# Patient Record
Sex: Female | Born: 1945 | Race: Black or African American | Hispanic: No | Smoking: Former smoker
Health system: Southern US, Community
[De-identification: ages and names within clinical notes are randomized; demographics above are authoritative.]

## PROBLEM LIST (undated history)

## (undated) DIAGNOSIS — J939 Pneumothorax, unspecified: Secondary | ICD-10-CM

## (undated) DIAGNOSIS — F319 Bipolar disorder, unspecified: Secondary | ICD-10-CM

## (undated) DIAGNOSIS — F0390 Unspecified dementia without behavioral disturbance: Secondary | ICD-10-CM

## (undated) DIAGNOSIS — I10 Essential (primary) hypertension: Secondary | ICD-10-CM

## (undated) DIAGNOSIS — F209 Schizophrenia, unspecified: Secondary | ICD-10-CM

## (undated) DIAGNOSIS — E278 Other specified disorders of adrenal gland: Secondary | ICD-10-CM

## (undated) DIAGNOSIS — C801 Malignant (primary) neoplasm, unspecified: Secondary | ICD-10-CM

## (undated) HISTORY — PX: OTHER SURGICAL HISTORY: SHX169

---

## 2000-10-28 ENCOUNTER — Ambulatory Visit (HOSPITAL_COMMUNITY): Admission: RE | Admit: 2000-10-28 | Discharge: 2000-10-28 | Payer: Self-pay | Admitting: Family Medicine

## 2000-10-28 ENCOUNTER — Encounter: Payer: Self-pay | Admitting: Family Medicine

## 2000-11-19 ENCOUNTER — Other Ambulatory Visit: Admission: RE | Admit: 2000-11-19 | Discharge: 2000-11-19 | Payer: Self-pay | Admitting: Family Medicine

## 2000-11-20 ENCOUNTER — Ambulatory Visit (HOSPITAL_COMMUNITY): Admission: RE | Admit: 2000-11-20 | Discharge: 2000-11-20 | Payer: Self-pay | Admitting: Family Medicine

## 2000-11-20 ENCOUNTER — Encounter: Payer: Self-pay | Admitting: Family Medicine

## 2000-12-03 ENCOUNTER — Ambulatory Visit (HOSPITAL_COMMUNITY): Admission: RE | Admit: 2000-12-03 | Discharge: 2000-12-03 | Payer: Self-pay | Admitting: General Surgery

## 2001-01-12 ENCOUNTER — Emergency Department (HOSPITAL_COMMUNITY): Admission: EM | Admit: 2001-01-12 | Discharge: 2001-01-12 | Payer: Self-pay | Admitting: Emergency Medicine

## 2003-08-29 ENCOUNTER — Ambulatory Visit (HOSPITAL_COMMUNITY): Admission: RE | Admit: 2003-08-29 | Discharge: 2003-08-29 | Payer: Self-pay | Admitting: Family Medicine

## 2003-11-29 ENCOUNTER — Emergency Department (HOSPITAL_COMMUNITY): Admission: EM | Admit: 2003-11-29 | Discharge: 2003-11-30 | Payer: Self-pay

## 2004-01-19 ENCOUNTER — Ambulatory Visit: Payer: Self-pay | Admitting: Family Medicine

## 2004-02-09 ENCOUNTER — Ambulatory Visit (HOSPITAL_COMMUNITY): Admission: RE | Admit: 2004-02-09 | Discharge: 2004-02-09 | Payer: Self-pay | Admitting: Family Medicine

## 2004-03-06 ENCOUNTER — Ambulatory Visit: Payer: Self-pay | Admitting: Family Medicine

## 2004-04-05 ENCOUNTER — Ambulatory Visit: Payer: Self-pay | Admitting: Family Medicine

## 2004-09-25 ENCOUNTER — Ambulatory Visit (HOSPITAL_COMMUNITY): Admission: RE | Admit: 2004-09-25 | Discharge: 2004-09-25 | Payer: Self-pay | Admitting: Family Medicine

## 2005-09-25 ENCOUNTER — Ambulatory Visit: Payer: Self-pay | Admitting: Family Medicine

## 2008-02-08 ENCOUNTER — Emergency Department (HOSPITAL_COMMUNITY): Admission: EM | Admit: 2008-02-08 | Discharge: 2008-02-08 | Payer: Self-pay | Admitting: Emergency Medicine

## 2008-03-16 ENCOUNTER — Emergency Department (HOSPITAL_COMMUNITY): Admission: EM | Admit: 2008-03-16 | Discharge: 2008-03-16 | Payer: Self-pay | Admitting: Emergency Medicine

## 2008-07-10 ENCOUNTER — Emergency Department (HOSPITAL_COMMUNITY): Admission: EM | Admit: 2008-07-10 | Discharge: 2008-07-10 | Payer: Self-pay | Admitting: Emergency Medicine

## 2008-07-19 ENCOUNTER — Emergency Department (HOSPITAL_COMMUNITY): Admission: EM | Admit: 2008-07-19 | Discharge: 2008-07-19 | Payer: Self-pay | Admitting: Emergency Medicine

## 2008-07-20 ENCOUNTER — Inpatient Hospital Stay (HOSPITAL_COMMUNITY): Admission: EM | Admit: 2008-07-20 | Discharge: 2008-07-25 | Payer: Self-pay | Admitting: Psychiatry

## 2008-07-20 ENCOUNTER — Ambulatory Visit: Payer: Self-pay | Admitting: Psychiatry

## 2008-11-25 ENCOUNTER — Ambulatory Visit: Payer: Self-pay | Admitting: Psychiatry

## 2008-11-25 ENCOUNTER — Inpatient Hospital Stay (HOSPITAL_COMMUNITY): Admission: AD | Admit: 2008-11-25 | Discharge: 2008-12-01 | Payer: Self-pay | Admitting: Psychiatry

## 2008-11-25 ENCOUNTER — Emergency Department (HOSPITAL_COMMUNITY): Admission: EM | Admit: 2008-11-25 | Discharge: 2008-11-25 | Payer: Self-pay | Admitting: Emergency Medicine

## 2008-12-30 ENCOUNTER — Emergency Department (HOSPITAL_COMMUNITY): Admission: EM | Admit: 2008-12-30 | Discharge: 2008-12-30 | Payer: Self-pay | Admitting: Emergency Medicine

## 2009-02-14 ENCOUNTER — Encounter: Payer: Self-pay | Admitting: Family Medicine

## 2009-02-27 ENCOUNTER — Ambulatory Visit (HOSPITAL_COMMUNITY): Admission: RE | Admit: 2009-02-27 | Discharge: 2009-02-27 | Payer: Self-pay | Admitting: Family Medicine

## 2009-02-28 ENCOUNTER — Ambulatory Visit (HOSPITAL_COMMUNITY): Admission: RE | Admit: 2009-02-28 | Discharge: 2009-02-28 | Payer: Self-pay | Admitting: Family Medicine

## 2009-07-21 ENCOUNTER — Emergency Department (HOSPITAL_COMMUNITY): Admission: EM | Admit: 2009-07-21 | Discharge: 2009-07-21 | Payer: Self-pay | Admitting: Emergency Medicine

## 2009-10-17 ENCOUNTER — Emergency Department (HOSPITAL_COMMUNITY): Admission: EM | Admit: 2009-10-17 | Discharge: 2009-10-17 | Payer: Self-pay | Admitting: Emergency Medicine

## 2009-10-25 ENCOUNTER — Emergency Department (HOSPITAL_COMMUNITY)
Admission: EM | Admit: 2009-10-25 | Discharge: 2009-10-25 | Disposition: A | Discharge status: Other Acute Inpatient Hospital | Payer: Self-pay | Source: Home / Self Care | Admitting: Emergency Medicine

## 2010-02-09 ENCOUNTER — Emergency Department (HOSPITAL_COMMUNITY)
Admission: EM | Admit: 2010-02-09 | Discharge: 2010-02-10 | Payer: Self-pay | Source: Home / Self Care | Admitting: Emergency Medicine

## 2010-02-10 ENCOUNTER — Inpatient Hospital Stay (HOSPITAL_COMMUNITY)
Admission: AD | Admit: 2010-02-10 | Discharge: 2010-02-14 | Payer: Self-pay | Attending: Psychiatry | Admitting: Psychiatry

## 2010-03-04 ENCOUNTER — Encounter: Payer: Self-pay | Admitting: Family Medicine

## 2010-03-15 NOTE — Letter (Signed)
Summary: Historic Patient File  Historic Patient File   Imported By: Lind Guest 02/14/2009 09:01:48  _____________________________________________________________________  External Attachment:    Type:   Image     Comment:   External Document

## 2010-04-18 ENCOUNTER — Emergency Department (HOSPITAL_COMMUNITY)
Admission: EM | Admit: 2010-04-18 | Discharge: 2010-04-18 | Disposition: A | Discharge status: Home / Self Care | Payer: Medicare Other | Attending: Emergency Medicine | Admitting: Emergency Medicine

## 2010-04-18 DIAGNOSIS — F29 Unspecified psychosis not due to a substance or known physiological condition: Secondary | ICD-10-CM | POA: Insufficient documentation

## 2010-04-18 DIAGNOSIS — Z8659 Personal history of other mental and behavioral disorders: Secondary | ICD-10-CM | POA: Insufficient documentation

## 2010-04-18 DIAGNOSIS — I10 Essential (primary) hypertension: Secondary | ICD-10-CM | POA: Insufficient documentation

## 2010-04-23 LAB — URINE CULTURE
Colony Count: NO GROWTH
Culture  Setup Time: 201112311954
Culture: NO GROWTH

## 2010-04-23 LAB — COMPREHENSIVE METABOLIC PANEL
CO2: 25 mEq/L (ref 19–32)
Calcium: 9.4 mg/dL (ref 8.4–10.5)
Chloride: 106 mEq/L (ref 96–112)
GFR calc Af Amer: >60 mL/min (ref >60)

## 2010-04-23 LAB — RAPID URINE DRUG SCREEN, HOSP PERFORMED
Amphetamines: NOT DETECTED
Barbiturates: NOT DETECTED
Benzodiazepines: NOT DETECTED
Cocaine: NOT DETECTED
Opiates: NOT DETECTED
Tetrahydrocannabinol: NOT DETECTED

## 2010-04-23 LAB — URINE MICROSCOPIC-ADD ON

## 2010-04-23 LAB — CBC
HCT: 43.8 % (ref 36.0–46.0)
Hemoglobin: 15.2 g/dL — ABNORMAL HIGH (ref 12.0–15.0)
MCH: 29.9 pg (ref 26.0–34.0)
MCH: 29.9 pg (ref 26.0–34.0)
MCHC: 34.7 g/dL (ref 30.0–36.0)
MCV: 86.2 fL (ref 78.0–100.0)
MCV: 88.9 fL (ref 78.0–100.0)
Platelets: 213 10*3/uL (ref 150–400)
Platelets: 207 10*3/uL (ref 150–400)
RBC: 5.08 MIL/uL (ref 3.87–5.11)
RDW: 13.5 % (ref 11.5–15.5)
WBC: 8.6 K/uL (ref 4.0–10.5)

## 2010-04-23 LAB — DRUGS OF ABUSE SCREEN W/O ALC, ROUTINE URINE
Barbiturate Quant, Ur: NEGATIVE
Benzodiazepines.: NEGATIVE
Cocaine Metabolites: NEGATIVE
Creatinine,U: 96.8 mg/dL
Marijuana Metabolite: NEGATIVE
Methadone: NEGATIVE
Opiate Screen, Urine: NEGATIVE
Phencyclidine (PCP): NEGATIVE

## 2010-04-23 LAB — URINALYSIS, ROUTINE W REFLEX MICROSCOPIC
Glucose, UA: NEGATIVE mg/dL
Glucose, UA: NEGATIVE mg/dL
Ketones, ur: 15 mg/dL — AB
Nitrite: NEGATIVE
Nitrite: NEGATIVE
Protein, ur: NEGATIVE mg/dL
Specific Gravity, Urine: >1.03 — ABNORMAL HIGH (ref 1.005–1.030)
Specific Gravity, Urine: 1.015 (ref 1.005–1.030)
Urobilinogen, UA: 0.2 mg/dL (ref 0.0–1.0)
pH: 5 (ref 5.0–8.0)
pH: 5.5 (ref 5.0–8.0)

## 2010-04-23 LAB — BASIC METABOLIC PANEL
BUN: 19 mg/dL (ref 6–23)
CO2: 22 mEq/L (ref 19–32)
Calcium: 9.3 mg/dL (ref 8.4–10.5)
Creatinine, Ser: 0.82 mg/dL (ref 0.4–1.2)
Potassium: 3.9 mEq/L (ref 3.5–5.1)
Sodium: 134 mEq/L — ABNORMAL LOW (ref 135–145)

## 2010-04-23 LAB — BASIC METABOLIC PANEL WITH GFR
Chloride: 99 meq/L (ref 96–112)
GFR calc Af Amer: >60 mL/min (ref >60)
GFR calc non Af Amer: >60 mL/min (ref >60)
Glucose, Bld: 131 mg/dL — ABNORMAL HIGH (ref 70–99)

## 2010-04-23 LAB — ETHANOL: Alcohol, Ethyl (B): <5 mg/dL (ref 0–10)

## 2010-04-23 LAB — DIFFERENTIAL
Basophils Absolute: 0.1 K/uL (ref 0.0–0.1)
Basophils Relative: 1 % (ref 0–1)
Eosinophils Absolute: 0.1 10*3/uL (ref 0.0–0.7)
Eosinophils Relative: 1 % (ref 0–5)
Lymphocytes Relative: 14 % (ref 12–46)
Lymphs Abs: 1.2 K/uL (ref 0.7–4.0)
Monocytes Absolute: 0.6 10*3/uL (ref 0.1–1.0)
Monocytes Relative: 7 % (ref 3–12)
Neutro Abs: 6.7 K/uL (ref 1.7–7.7)
Neutrophils Relative %: 78 % — ABNORMAL HIGH (ref 43–77)

## 2010-04-23 LAB — TSH: TSH: 1.232 u[IU]/mL (ref 0.350–4.500)

## 2010-04-26 LAB — DIFFERENTIAL
Basophils Absolute: 0 10*3/uL (ref 0.0–0.1)
Basophils Relative: 1 % (ref 0–1)
Eosinophils Absolute: 0 10*3/uL (ref 0.0–0.7)
Lymphocytes Relative: 16 % (ref 12–46)
Lymphocytes Relative: 25 % (ref 12–46)
Lymphs Abs: 1.1 10*3/uL (ref 0.7–4.0)
Monocytes Relative: 6 % (ref 3–12)
Neutro Abs: 4.2 10*3/uL (ref 1.7–7.7)
Neutrophils Relative %: 77 % (ref 43–77)

## 2010-04-26 LAB — BASIC METABOLIC PANEL
BUN: 12 mg/dL (ref 6–23)
BUN: 8 mg/dL (ref 6–23)
Calcium: 9.3 mg/dL (ref 8.4–10.5)
Chloride: 105 mEq/L (ref 96–112)
Creatinine, Ser: 0.63 mg/dL (ref 0.4–1.2)
GFR calc Af Amer: >60 mL/min (ref >60)
GFR calc Af Amer: >60 mL/min (ref >60)
GFR calc non Af Amer: >60 mL/min (ref >60)

## 2010-04-26 LAB — CBC
HCT: 45 % (ref 36.0–46.0)
MCH: 30 pg (ref 26.0–34.0)
MCV: 90.6 fL (ref 78.0–100.0)
Platelets: 226 10*3/uL (ref 150–400)
Platelets: 237 10*3/uL (ref 150–400)
RBC: 4.62 MIL/uL (ref 3.87–5.11)
RDW: 14.9 % (ref 11.5–15.5)
RDW: 15 % (ref 11.5–15.5)
WBC: 7 10*3/uL (ref 4.0–10.5)

## 2010-04-26 LAB — RAPID URINE DRUG SCREEN, HOSP PERFORMED
Amphetamines: NOT DETECTED
Amphetamines: NOT DETECTED
Benzodiazepines: NOT DETECTED
Cocaine: NOT DETECTED
Opiates: NOT DETECTED
Tetrahydrocannabinol: NOT DETECTED
Tetrahydrocannabinol: NOT DETECTED

## 2010-04-26 LAB — TRICYCLICS SCREEN, URINE: TCA Scrn: NOT DETECTED

## 2010-04-26 LAB — ETHANOL
Alcohol, Ethyl (B): <5 mg/dL (ref 0–10)
Alcohol, Ethyl (B): <5 mg/dL (ref 0–10)

## 2010-04-30 LAB — CBC
HCT: 38.7 % (ref 36.0–46.0)
Hemoglobin: 13.2 g/dL (ref 12.0–15.0)
RBC: 4.37 MIL/uL (ref 3.87–5.11)
WBC: 5.8 10*3/uL (ref 4.0–10.5)

## 2010-04-30 LAB — BASIC METABOLIC PANEL
Calcium: 9.4 mg/dL (ref 8.4–10.5)
GFR calc Af Amer: >60 mL/min (ref >60)
GFR calc non Af Amer: >60 mL/min (ref >60)
Glucose, Bld: 117 mg/dL — ABNORMAL HIGH (ref 70–99)
Potassium: 4.1 mEq/L (ref 3.5–5.1)
Sodium: 138 mEq/L (ref 135–145)

## 2010-04-30 LAB — DIFFERENTIAL
Basophils Absolute: 0 10*3/uL (ref 0.0–0.1)
Eosinophils Relative: 5 % (ref 0–5)
Lymphocytes Relative: 24 % (ref 12–46)
Lymphs Abs: 1.4 10*3/uL (ref 0.7–4.0)
Monocytes Absolute: 0.4 10*3/uL (ref 0.1–1.0)
Monocytes Relative: 7 % (ref 3–12)
Neutro Abs: 3.8 10*3/uL (ref 1.7–7.7)

## 2010-04-30 LAB — URINALYSIS, ROUTINE W REFLEX MICROSCOPIC
Glucose, UA: NEGATIVE mg/dL
Ketones, ur: NEGATIVE mg/dL
Leukocytes, UA: NEGATIVE
Nitrite: NEGATIVE
Protein, ur: NEGATIVE mg/dL
pH: 5.5 (ref 5.0–8.0)

## 2010-04-30 LAB — RAPID URINE DRUG SCREEN, HOSP PERFORMED
Amphetamines: NOT DETECTED
Opiates: NOT DETECTED
Tetrahydrocannabinol: NOT DETECTED

## 2010-04-30 LAB — URINE MICROSCOPIC-ADD ON

## 2010-05-17 LAB — URINALYSIS, ROUTINE W REFLEX MICROSCOPIC
Glucose, UA: NEGATIVE mg/dL
Nitrite: NEGATIVE
Specific Gravity, Urine: 1.025 (ref 1.005–1.030)
pH: 5.5 (ref 5.0–8.0)

## 2010-05-17 LAB — DIFFERENTIAL
Basophils Absolute: 0 10*3/uL (ref 0.0–0.1)
Basophils Relative: 0 % (ref 0–1)
Eosinophils Absolute: 0.3 10*3/uL (ref 0.0–0.7)
Eosinophils Relative: 5 % (ref 0–5)
Monocytes Absolute: 0.5 10*3/uL (ref 0.1–1.0)
Neutro Abs: 3.3 10*3/uL (ref 1.7–7.7)

## 2010-05-17 LAB — BASIC METABOLIC PANEL
CO2: 24 mEq/L (ref 19–32)
Calcium: 9.3 mg/dL (ref 8.4–10.5)
Chloride: 109 mEq/L (ref 96–112)
Glucose, Bld: 174 mg/dL — ABNORMAL HIGH (ref 70–99)
Sodium: 138 mEq/L (ref 135–145)

## 2010-05-17 LAB — GLUCOSE, CAPILLARY
Glucose-Capillary: 110 mg/dL — ABNORMAL HIGH (ref 70–99)
Glucose-Capillary: 123 mg/dL — ABNORMAL HIGH (ref 70–99)
Glucose-Capillary: 133 mg/dL — ABNORMAL HIGH (ref 70–99)
Glucose-Capillary: 149 mg/dL — ABNORMAL HIGH (ref 70–99)
Glucose-Capillary: 173 mg/dL — ABNORMAL HIGH (ref 70–99)
Glucose-Capillary: 83 mg/dL (ref 70–99)
Glucose-Capillary: 98 mg/dL (ref 70–99)

## 2010-05-17 LAB — URINE MICROSCOPIC-ADD ON

## 2010-05-17 LAB — CBC
HCT: 42.6 % (ref 36.0–46.0)
Hemoglobin: 14.7 g/dL (ref 12.0–15.0)
MCHC: 34.5 g/dL (ref 30.0–36.0)
MCV: 90.5 fL (ref 78.0–100.0)
RDW: 13.5 % (ref 11.5–15.5)

## 2010-05-17 LAB — GLUCOSE, RANDOM: Glucose, Bld: 121 mg/dL — ABNORMAL HIGH (ref 70–99)

## 2010-05-21 LAB — BASIC METABOLIC PANEL
BUN: 9 mg/dL (ref 6–23)
Calcium: 9 mg/dL (ref 8.4–10.5)
GFR calc non Af Amer: >60 mL/min (ref >60)
Glucose, Bld: 143 mg/dL — ABNORMAL HIGH (ref 70–99)
Potassium: 3.9 mEq/L (ref 3.5–5.1)
Sodium: 136 mEq/L (ref 135–145)

## 2010-05-21 LAB — URINALYSIS, ROUTINE W REFLEX MICROSCOPIC
Bilirubin Urine: NEGATIVE
Hgb urine dipstick: NEGATIVE
Ketones, ur: NEGATIVE mg/dL
Nitrite: NEGATIVE
Urobilinogen, UA: 0.2 mg/dL (ref 0.0–1.0)

## 2010-05-21 LAB — DIFFERENTIAL
Basophils Absolute: 0 10*3/uL (ref 0.0–0.1)
Eosinophils Relative: 2 % (ref 0–5)
Lymphocytes Relative: 23 % (ref 12–46)
Lymphs Abs: 1 10*3/uL (ref 0.7–4.0)
Neutro Abs: 2.9 10*3/uL (ref 1.7–7.7)
Neutrophils Relative %: 67 % (ref 43–77)

## 2010-05-21 LAB — CBC
HCT: 38.8 % (ref 36.0–46.0)
Platelets: 110 10*3/uL — ABNORMAL LOW (ref 150–400)
RDW: 13.2 % (ref 11.5–15.5)
WBC: 4.3 10*3/uL (ref 4.0–10.5)

## 2010-05-21 LAB — VALPROIC ACID LEVEL: Valproic Acid Lvl: 71 ug/mL (ref 50.0–100.0)

## 2010-05-21 LAB — RAPID URINE DRUG SCREEN, HOSP PERFORMED
Amphetamines: NOT DETECTED
Cocaine: NOT DETECTED
Tetrahydrocannabinol: NOT DETECTED

## 2010-05-21 LAB — ETHANOL: Alcohol, Ethyl (B): <5 mg/dL (ref 0–10)

## 2010-05-22 LAB — URINALYSIS, ROUTINE W REFLEX MICROSCOPIC
Bilirubin Urine: NEGATIVE
Glucose, UA: NEGATIVE mg/dL
Leukocytes, UA: NEGATIVE
Protein, ur: NEGATIVE mg/dL
pH: 6.5 (ref 5.0–8.0)

## 2010-05-22 LAB — URINE MICROSCOPIC-ADD ON

## 2010-05-22 LAB — DIFFERENTIAL
Eosinophils Relative: 3 % (ref 0–5)
Lymphocytes Relative: 28 % (ref 12–46)
Lymphs Abs: 1.3 10*3/uL (ref 0.7–4.0)
Monocytes Relative: 8 % (ref 3–12)
Neutrophils Relative %: 61 % (ref 43–77)

## 2010-05-22 LAB — CBC
HCT: 41.2 % (ref 36.0–46.0)
Platelets: 187 10*3/uL (ref 150–400)
RBC: 4.49 MIL/uL (ref 3.87–5.11)
WBC: 4.5 10*3/uL (ref 4.0–10.5)

## 2010-05-22 LAB — RAPID URINE DRUG SCREEN, HOSP PERFORMED
Barbiturates: NOT DETECTED
Benzodiazepines: NOT DETECTED
Cocaine: NOT DETECTED
Opiates: NOT DETECTED

## 2010-05-22 LAB — ETHANOL: Alcohol, Ethyl (B): <5 mg/dL (ref 0–10)

## 2010-05-22 LAB — BASIC METABOLIC PANEL
BUN: 6 mg/dL (ref 6–23)
Chloride: 102 mEq/L (ref 96–112)
GFR calc Af Amer: >60 mL/min (ref >60)
GFR calc non Af Amer: >60 mL/min (ref >60)
Potassium: 4.1 mEq/L (ref 3.5–5.1)

## 2010-05-29 LAB — COMPREHENSIVE METABOLIC PANEL
ALT: <8 U/L (ref 0–35)
AST: 15 U/L (ref 0–37)
Albumin: 3.5 g/dL (ref 3.5–5.2)
Alkaline Phosphatase: 50 U/L (ref 39–117)
Chloride: 101 mEq/L (ref 96–112)
Creatinine, Ser: 0.61 mg/dL (ref 0.4–1.2)
GFR calc Af Amer: >60 mL/min (ref >60)
Potassium: 3.6 mEq/L (ref 3.5–5.1)
Sodium: 137 mEq/L (ref 135–145)
Total Bilirubin: 0.4 mg/dL (ref 0.3–1.2)

## 2010-05-29 LAB — CBC
Platelets: 148 10*3/uL — ABNORMAL LOW (ref 150–400)
WBC: 9.1 10*3/uL (ref 4.0–10.5)

## 2010-05-29 LAB — DIFFERENTIAL
Eosinophils Absolute: 0 10*3/uL (ref 0.0–0.7)
Eosinophils Relative: 1 % (ref 0–5)
Lymphs Abs: 1.4 10*3/uL (ref 0.7–4.0)
Monocytes Absolute: 0.6 10*3/uL (ref 0.1–1.0)

## 2010-05-29 LAB — URINE MICROSCOPIC-ADD ON

## 2010-05-29 LAB — URINALYSIS, ROUTINE W REFLEX MICROSCOPIC
Glucose, UA: NEGATIVE mg/dL
Hgb urine dipstick: NEGATIVE
pH: 6.5 (ref 5.0–8.0)

## 2010-06-26 NOTE — H&P (Signed)
Brittney Wall, Brittney Wall NO.:  [id number]   MEDICAL RECORD NO.:  [id number]          PATIENT TYPE:  IPS   LOCATION:  0400                          FACILITY:  [facility]   PHYSICIAN:  Anselm Jungling, MD  DATE OF BIRTH:  1945-03-03   DATE OF ADMISSION:  07/20/2008  DATE OF DISCHARGE:  07/19/2008                       PSYCHIATRIC ADMISSION ASSESSMENT   IDENTIFYING INFORMATION/JUSTIFICATION FOR ADMISSION AND CARE:  A 65-year-  old female, single.  This is a voluntary admission.   HISTORY OF PRESENT ILLNESS:  This is a first [facility] admission for this 89-  year-old with a history of undifferentiated schizophrenia who was picked  up by emergency services today at mental health where she was refusing  treatment and she has recently been frequently calling 911, reporting  untrue emergencies. She has been increasingly restless, making verbal  statements to family members that she was going to kill herself.  She  reports that she lives in [place] with her parents.  She is an  unreliable historian. [facility] case worker has  reported that she refused her last Haldol Decanoate injection of 100 mg  every 14 days, which was due approximately 2 weeks ago.  Family members  were available in the emergency room to give her history.  The patient  herself is a poor historian and is uncooperative with giving history.  According to her mother, she lives with her mother and her niece and has  had worsening paranoia, outbursts yelling and calling 911 frequently and  they have had difficulty controlling her.  Her daughter reports that she  had missed her bi-weekly  injection because she was unwilling to wait  long enough to get it at mental health.  Denies that there is other  substance abuse, however use of her oral medications has been somewhat  erratic. However, it is noted that her Valproic level was 71 in the  emergency room.   PAST PSYCHIATRIC HISTORY:  Not  available.  She is treated for  undifferentiated schizophrenia at [facility] in  [place], [place].   SOCIAL HISTORY:  Single female, lives with relatives and no known legal  problems.   FAMILY HISTORY:  Not available.   PAST MEDICAL HISTORY:  Primary care [facility]o Zielinski unclear.  She appears to  have seen Dr. Syliva Overman in [place] in the past. Current  medical problems are hypertension.   CURRENT MEDICATIONS:  Apparently she was on Azor 5/40 mg in the past,  currently noncompliant.  1. Depakote 500 mg b.i.d.  2. Haldol Decanoate 100 mg IM q. 14 days, last given at least 2 weeks      ago, possibly longer. Med reconciliation statement from mental      health reports last received it May 19.  3. She takes Ativan 0.5 mg q.h.s. She received 2 mg of Ativan in the      emergency room.  4. Artane 2 mg b.i.d.   ALLERGIES:  NONE KNOWN.   PHYSICAL EXAMINATION:  GENERAL:  Physical exam was done in the emergency  room. A slim built female, about 5 feet  7 inches tall. I do not have a  full weight, an accurate weight on her. She is disheveled, dirty with  poor physical hygiene.  VITAL SIGNS:  Temperature 97.6, Pulse 78, respirations 20, blood  pressure 159/80 on admission with a pulse ox of 100%. A full physical  exam noted in the emergency room.  No signs of trauma or acute physical  distress.  Currently she is eating ice cream.   LABORATORY DATA:  Urine drug screen positive for benzodiazepines.  CBC  normal.  Alcohol level less than five.  Basic chemistry is normal.  BUN  9, creatinine 0.88.  Routine urinalysis revealed clear yellow urine,  specific gravity 1.005, negative for ketones, hemoglobin and bilirubin  and negative for leukocyte esterase.  Depakote level 71.   MENTAL STATUS EXAM:  Examination reveals a disheveled female with a  somewhat hyper-vigilant and anxious affect, is cooperative and  directable. Stairs at me for most questions. Unable to answer  many. Is  able to give one or two word answers. Tells me where she lives in  [place] but says that she lives with her parents, unreliable  historian.  Oriented to person, understands that she is in the hospital  but does not know why. Does not remember that she takes a Haldol  Decanoate injection but is certainly willing to take it today. She is  directable, relatively calm, interested primarily in personal comfort  items.  Speech is minimal in production but normal in tone in pace.  She  has not voiced any suicidal or homicidal or other dangerous thoughts.  Has not been aggressive with staff,  participating in unit activities  but has little to offer.   DIAGNOSES:  AXIS I:  Schizophrenia, undifferentiated type, chronic acute  exacerbation.  AXIS II:  No diagnosis.  AXIS III:  Hypertension.  AXIS IV:  Severe issues with burden of mental illness and issues with  medication compliance.  AXIS V:  Current 24. Past year not known.   PLAN:  Is to voluntarily admit her to stabilize her mood, affect and  hopefully improve her ability to participate a little bit.  She says  that she is willing to take her Haldol Decanoate injection today and so  we will give that and will resume her other previous medications and  attempt to get some additional input from her family.  She is on our  intensive care unit.      Margaret A. Lorin Picket, N.P.      Anselm Jungling, MD  Electronically Signed    MAS/MEDQ  D:  07/20/2008  T:  07/20/2008  Job:  [id number]

## 2010-06-29 NOTE — Discharge Summary (Signed)
NAME:  Brittney, Wall NO.:  [id number]   MEDICAL RECORD NO.:  [id number]          PATIENT TYPE:  IPS   LOCATION:  0400                          FACILITY:  [facility]   PHYSICIAN:  Anselm Jungling, MD  DATE OF BIRTH:  Feb 02, 1946   DATE OF ADMISSION:  07/20/2008  DATE OF DISCHARGE:  07/25/2008                               DISCHARGE SUMMARY   IDENTIFYING DATA AND REASON FOR ADMISSION:  This was an inpatient  psychiatric admission for Brittney Wall, a 65 year old African American female  who had several previous admissions to our facility in the past.  She  came to [place] with a history of chronic mental illness, schizophrenia.  She  was admitted due to exacerbation of her underlying illness.  Please  refer to the admission note for further details pertaining to the  symptoms, circumstances and history that led to her hospitalization.  She was given initial Axis I diagnosis of schizophrenia NOS.   MEDICAL AND LABORATORY:  The patient was medically and physically  assessed by the psychiatric nurse practitioner.  She did not have any  acute medical problems.  There were no significant medical issues.  She  was continued on her usual aspirin 81 mg daily.   HOSPITAL COURSE:  The patient was admitted to the adult inpatient  psychiatric service.  She presented as a disheveled, [place] American  female with disorganized thinking, and a mildly impulsive style of  thinking and speaking.  She had minimal insight into her illness.  She  requested discharge early on in her stay, which was reflective of her  lack of insight into her need for treatment.   She allowed [place] to contact her outside supports, including brother, who  was appraised of the patient throughout his stay.  She was treated with  a psychotropic regimen included Depakote, Haldol, Haldol Decanoate,  Ativan, and Artane.   The patient responded well to treatment, it was appropriate for  discharge on the seventh hospital day.   At that time she appeared to be  at her baseline level of functioning, without any active hallucinations,  paranoia or delusions.  She agreed to the following aftercare plan.   AFTERCARE:  The patient is to followup at [facility] in  [place], [place] with a followup appointment on June 16 at 8  a.m.   DISCHARGE MEDICATIONS:  1. Artane 2 mg b.i.d.  2. Aspirin 81 mg daily.  3. Depakote 500 mg b.i.d.  4. Haldol 5 mg b.i.d.  5. Haldol Decanoate 100 mg IM every 2 weeks.  Next injection due June      23.  6. Ativan 0.5 mg p.o. q.h.s.   DISCHARGE DIAGNOSES:  AXIS I:  Schizoaffective disorder not otherwise  specified.  AXIS II:  Deferred.  AXIS III:  No acute or chronic illnesses.  AXIS IV:  Stressors severe.  AXIS V: GAF on discharge 50.      Anselm Jungling, MD  Electronically Signed     SPB/MEDQ  D:  08/02/2008  T:  08/02/2008  Job:  [id number]

## 2010-06-29 NOTE — H&P (Signed)
[facility]  Patient:    Brittney, Wall Visit Number: [id number] MRN: [id number]          Service Type: OUT Location: RAD Attending Physician:  Syliva Overman Dictated by:   Elpidio Anis, M.D. Admit Date:  11/20/2000 Discharge Date: 11/20/2000                           History and Physical  HISTORY OF PRESENT ILLNESS:  [age]-year-old female with a history of guaiac-positive stools was referred for colonoscopy.  There is no history of bright red rectal bleeding and no known history of colon cancer.  PAST HISTORY:  Positive for hypertension and a long history of undifferentiated schizophrenia.  MEDICATIONS:  ______ 240 mg a day.  The patient does not take her psychotic drugs.  SOCIAL HISTORY:  She smokes three to four packs of cigarettes per day.  No history of any abdominal surgery.  PHYSICAL EXAMINATION:  VITAL SIGNS:  Blood pressure 142/88, pulse 80, respirations 18.  Weight 168 pounds.  HEENT:  Unremarkable.  The patient is edentulous.  NECK:  Supple.  No JVD or bruit.  CHEST:  Diffuse bilateral rhonchi with basilar rales.  HEART:  Regular and rhythm.  No murmur, gallop or rub.  ABDOMEN:  Soft and nontender.  No masses.  EXTREMITIES:  No cyanosis, clubbing or edema.  NEUROLOGIC:  No focal motor, sensory or cerebellar deficit.  IMPRESSION: 1. Guaiac-positive stools. 2. Hypertension. 3. Undifferentiated schizophrenia.  PLAN:  Screening colonoscopy. Dictated by:   Elpidio Anis, M.D. Attending Physician:  Syliva Overman DD:  12/03/00 TD:  12/03/00 Job: [id number]

## 2010-11-16 LAB — CBC
Hemoglobin: 13.9 g/dL (ref 12.0–15.0)
MCHC: 34.1 g/dL (ref 30.0–36.0)
MCV: 90.3 fL (ref 78.0–100.0)
RBC: 4.51 MIL/uL (ref 3.87–5.11)
WBC: 4.7 10*3/uL (ref 4.0–10.5)

## 2010-11-16 LAB — DIFFERENTIAL
Basophils Relative: 1 % (ref 0–1)
Eosinophils Absolute: 0.1 10*3/uL (ref 0.0–0.7)
Lymphs Abs: 1.3 10*3/uL (ref 0.7–4.0)
Monocytes Absolute: 0.5 10*3/uL (ref 0.1–1.0)
Monocytes Relative: 10 % (ref 3–12)

## 2010-11-16 LAB — URINALYSIS, ROUTINE W REFLEX MICROSCOPIC
Glucose, UA: 100 mg/dL — AB
Ketones, ur: 15 mg/dL — AB
Protein, ur: 30 mg/dL — AB

## 2010-11-16 LAB — BASIC METABOLIC PANEL
CO2: 27 mEq/L (ref 19–32)
Chloride: 108 mEq/L (ref 96–112)
GFR calc Af Amer: >60 mL/min (ref >60)
Potassium: 3.8 mEq/L (ref 3.5–5.1)
Sodium: 138 mEq/L (ref 135–145)

## 2010-11-16 LAB — URINE MICROSCOPIC-ADD ON

## 2011-02-07 ENCOUNTER — Emergency Department (HOSPITAL_COMMUNITY)
Admission: EM | Admit: 2011-02-07 | Discharge: 2011-02-07 | Disposition: A | Discharge status: Home / Self Care | Payer: Medicare Other | Attending: Emergency Medicine | Admitting: Emergency Medicine

## 2011-02-07 ENCOUNTER — Encounter: Payer: Self-pay | Admitting: *Deleted

## 2011-02-07 DIAGNOSIS — I10 Essential (primary) hypertension: Secondary | ICD-10-CM

## 2011-02-07 DIAGNOSIS — Z79899 Other long term (current) drug therapy: Secondary | ICD-10-CM | POA: Insufficient documentation

## 2011-02-07 DIAGNOSIS — F17200 Nicotine dependence, unspecified, uncomplicated: Secondary | ICD-10-CM | POA: Insufficient documentation

## 2011-02-07 DIAGNOSIS — Z7982 Long term (current) use of aspirin: Secondary | ICD-10-CM | POA: Insufficient documentation

## 2011-02-07 HISTORY — DX: Schizophrenia, unspecified: F20.9

## 2011-02-07 HISTORY — DX: Essential (primary) hypertension: I10

## 2011-02-07 NOTE — ED Notes (Signed)
States she was seen at mental health for her monthly injection and was advised to come in for a check of her bp

## 2011-02-07 NOTE — ED Provider Notes (Signed)
Scribed for [employer]. Colon Branch, MD, the patient was seen in room APA15/APA15 . This chart was scribed by Ellie Lunch.   CSN: [id number]  Arrival date & time 02/07/11  1245   First MD Initiated Contact with Patient 02/07/11 1348      Chief Complaint  Patient presents with  . Hypertension    (Consider location/radiation/quality/duration/timing/severity/associated sxs/prior treatment) The history is provided by a relative and the patient. No language interpreter was used.   Brittney Wall is a 65 y.o. female who presents to the Emergency Department complaining of hypertension. Pt was at mental health today to receive a shot, had her BP checks and was advised to come to ED to have her BP checked.  Pt normally takes 2 medications Clonidine and Hydralazine for hypertension but has been out of hydralazine for the past two weeks due to a lapse in refills.   Past Medical History  Diagnosis Date  . Hypertension   . Schizophrenia     Past Surgical History  Procedure Date  . Ortho procedure     No family history on file.  History  Substance Use Topics  . Smoking status: Current Everyday Smoker -- 2.0 packs/day  . Smokeless tobacco: Not on file  . Alcohol Use: No    Review of Systems 10 Systems reviewed and are negative for acute change except as noted in the HPI.  Allergies  Review of patient's allergies indicates no known allergies.  Home Medications   Current Outpatient Rx  Name Route Sig Dispense Refill  . ASPIRIN EC 81 MG PO TBEC Oral Take 81 mg by mouth daily.      Marland Kitchen CLONIDINE HCL 0.1 MG PO TABS Oral Take 0.1 mg by mouth 3 (three) times daily.      Marland Kitchen HYDRALAZINE HCL 10 MG PO TABS Oral Take 10 mg by mouth at bedtime.     Marland Kitchen LORAZEPAM 0.5 MG PO TABS Oral Take 0.5 mg by mouth every 8 (eight) hours as needed. For anixety    . MIRTAZAPINE 30 MG PO TABS Oral Take 30 mg by mouth at bedtime.      . TRAZODONE HCL 50 MG PO TABS Oral Take 50 mg by mouth at bedtime.        BP  210/174  Pulse 88  Temp(Src) 98.1 F (36.7 C) (Oral)  Resp 20  Ht 5\' 7"  (1.702 m)  SpO2 100%  Physical Exam  Nursing note and vitals reviewed. Constitutional: She is oriented to person, place, and time. She appears well-developed and well-nourished. No distress.  HENT:  Head: Normocephalic and atraumatic.  Eyes: EOM are normal. Pupils are equal, round, and reactive to light.  Neck: Neck supple.  Cardiovascular: Normal rate, regular rhythm and normal heart sounds.   Pulmonary/Chest: Effort normal. No respiratory distress.  Abdominal: Soft. There is no tenderness.  Neurological: She is alert and oriented to person, place, and time.  Skin: Skin is warm and dry.    ED Course  Procedures (including critical care time) DIAGNOSTIC STUDIES: Oxygen Saturation is 100% on room air, normal by my interpretation.     MDM  Patient with hypertension who has been of her medication for two weeks. She was seen at Mental health today and blood pressure was elevated. She is experiencing no hypertensive symptoms. She will resume her regular medicines today. They are normally administered by her daughter.Pt stable in ED with no significant deterioration in condition.The patient appears reasonably screened and/or stabilized for discharge and I  doubt any other medical condition or other [facility] requiring further screening, evaluation, or treatment in the ED at this time prior to discharge.  I personally performed the services described in this documentation, which was scribed in my presence. The recorded information has been reviewed and considered.   MDM Reviewed: nursing note and vitals      Nicoletta Dress. Colon Branch, MD 02/07/11 [phone]

## 2011-02-07 NOTE — ED Notes (Signed)
Witnessed pt take home medication Lorazepam 0.5mg  x1 tab and Clonidine HCL 0.1mg  x1 tab.  Pt states I think I should take these pills I take them three times a day and they are due now.

## 2011-04-03 DIAGNOSIS — F209 Schizophrenia, unspecified: Secondary | ICD-10-CM | POA: Diagnosis not present

## 2011-05-06 ENCOUNTER — Emergency Department (HOSPITAL_COMMUNITY)
Admission: EM | Admit: 2011-05-06 | Discharge: 2011-05-07 | Disposition: A | Discharge status: Home / Self Care | Payer: Medicare Other | Attending: Emergency Medicine | Admitting: Emergency Medicine

## 2011-05-06 ENCOUNTER — Encounter (HOSPITAL_COMMUNITY): Payer: Self-pay | Admitting: *Deleted

## 2011-05-06 ENCOUNTER — Emergency Department (HOSPITAL_COMMUNITY): Payer: Medicare Other

## 2011-05-06 DIAGNOSIS — F17200 Nicotine dependence, unspecified, uncomplicated: Secondary | ICD-10-CM | POA: Diagnosis not present

## 2011-05-06 DIAGNOSIS — I10 Essential (primary) hypertension: Secondary | ICD-10-CM | POA: Diagnosis not present

## 2011-05-06 DIAGNOSIS — R05 Cough: Secondary | ICD-10-CM | POA: Insufficient documentation

## 2011-05-06 DIAGNOSIS — R000 Tachycardia, unspecified: Secondary | ICD-10-CM | POA: Diagnosis not present

## 2011-05-06 DIAGNOSIS — J189 Pneumonia, unspecified organism: Secondary | ICD-10-CM | POA: Insufficient documentation

## 2011-05-06 DIAGNOSIS — R918 Other nonspecific abnormal finding of lung field: Secondary | ICD-10-CM | POA: Diagnosis not present

## 2011-05-06 DIAGNOSIS — R059 Cough, unspecified: Secondary | ICD-10-CM | POA: Diagnosis not present

## 2011-05-06 DIAGNOSIS — J438 Other emphysema: Secondary | ICD-10-CM | POA: Diagnosis not present

## 2011-05-06 MED ORDER — CEFTRIAXONE SODIUM 1 G IJ SOLR
1.0000 g | Freq: Once | INTRAMUSCULAR | Status: AC
  Administered 2011-05-06: 1 g via INTRAMUSCULAR
  Filled 2011-05-06: qty 10

## 2011-05-06 MED ORDER — AZITHROMYCIN 250 MG PO TABS
500.0000 mg | ORAL_TABLET | Freq: Once | ORAL | Status: AC
  Administered 2011-05-06: 500 mg via ORAL
  Filled 2011-05-06: qty 2

## 2011-05-06 MED ORDER — CLONIDINE HCL 0.1 MG PO TABS
0.1000 mg | ORAL_TABLET | Freq: Once | ORAL | Status: AC
  Administered 2011-05-06: 0.1 mg via ORAL
  Filled 2011-05-06: qty 1

## 2011-05-06 NOTE — ED Provider Notes (Signed)
History     CSN: [id number]  Arrival date & time 05/06/11  2145   First MD Initiated Contact with Patient 05/06/11 2304      Chief Complaint  Patient presents with  . URI    (Consider location/radiation/quality/duration/timing/severity/associated sxs/prior treatment) HPI Comments: Pt states she has nasal congestion.  She has also had a mild NP cough.  Denies fever/chills and SOB.  No CP.  States she didn't want to come to ED but her daughter made.  She intends to see her MD in the next couple days.    Her BP is elevated.  She states she has been taking her medicine.  She doesn't think she has missed any doses.  Patient is a 66 y.o. female presenting with URI. The history is provided by the patient. No language interpreter was used.  URI The primary symptoms include cough. Primary symptoms do not include fever or wheezing. Episode onset: several days ago. This is a new problem. The problem has not changed since onset. Symptoms associated with the illness include congestion. The illness is not associated with chills.    Past Medical History  Diagnosis Date  . Hypertension   . Schizophrenia     Past Surgical History  Procedure Date  . Ortho procedure     No family history on file.  History  Substance Use Topics  . Smoking status: Current Everyday Smoker -- 2.0 packs/day  . Smokeless tobacco: Not on file  . Alcohol Use: No    OB History    Grav Para Term Preterm Abortions TAB SAB Ect Mult Living                  Review of Systems  Constitutional: Negative for fever and chills.  HENT: Positive for congestion.   Respiratory: Positive for cough. Negative for shortness of breath, wheezing and stridor.   Cardiovascular: Negative for chest pain and leg swelling.  All other systems reviewed and are negative.    Allergies  Review of patient's allergies indicates no known allergies.  Home Medications   Current Outpatient Rx  Name Route Sig Dispense Refill  .  CLONIDINE HCL 0.1 MG PO TABS Oral Take 0.1 mg by mouth 3 (three) times daily.      Marland Kitchen HYDRALAZINE HCL 10 MG PO TABS Oral Take 10 mg by mouth at bedtime.     Marland Kitchen LORAZEPAM 0.5 MG PO TABS Oral Take 0.5 mg by mouth every 8 (eight) hours as needed. For anixety    . MIRTAZAPINE 30 MG PO TABS Oral Take 30 mg by mouth at bedtime.      . TRAZODONE HCL 50 MG PO TABS Oral Take 50 mg by mouth at bedtime.      . ASPIRIN EC 81 MG PO TBEC Oral Take 81 mg by mouth daily.        BP 180/82  Pulse 102  Temp(Src) 99.8 F (37.7 C) (Oral)  Resp 20  Ht 5\' 6"  (1.676 m)  Wt 150 lb (68.04 kg)  BMI 24.21 kg/m2  SpO2 92%  Physical Exam  Nursing note and vitals reviewed. Constitutional: She is oriented to person, place, and time. She appears well-developed and well-nourished. No distress.  HENT:  Head: Normocephalic and atraumatic.  Right Ear: External ear normal.  Left Ear: External ear normal.  Nose: Right sinus exhibits no maxillary sinus tenderness and no frontal sinus tenderness. Left sinus exhibits no maxillary sinus tenderness and no frontal sinus tenderness.  Mouth/Throat: No oropharyngeal exudate.  Mild nasal congestion  Eyes: EOM are normal.  Neck: Normal range of motion. No JVD present.  Cardiovascular: Regular rhythm, S1 normal, S2 normal, normal heart sounds and normal pulses.   No extrasystoles are present. Tachycardia present.  PMI is not displaced.  Exam reveals no gallop and no friction rub.   No murmur heard. Pulmonary/Chest: Effort normal. No accessory muscle usage or stridor. Not tachypneic. No respiratory distress. She has no decreased breath sounds. She has no wheezes. She has rhonchi. She has no rales. She exhibits no tenderness.       Scattered rhonchi   Abdominal: Soft. She exhibits no distension. There is no tenderness.  Musculoskeletal: Normal range of motion.  Lymphadenopathy:    She has no cervical adenopathy.  Neurological: She is alert and oriented to person, place, and  time.  Skin: Skin is warm and dry. She is not diaphoretic.  Psychiatric: She has a normal mood and affect. Judgment normal.    ED Course  Procedures (including critical care time)  Labs Reviewed - No data to display Dg Chest 2 View  05/06/2011  *RADIOLOGY REPORT*  Clinical Data: Cough and congestion.  CHEST - 2 VIEW  Comparison: 07/21/2009 and prior chest radiographs  Findings: Upper limits normal heart size again noted. Mild opacity in the posterior lower lobe on the lateral view is noted suspicious for atelectasis or airspace disease/pneumonia. 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 There is no evidence of pleural effusion or pneumothorax.  IMPRESSION: Question atelectasis versus pneumonia in the posterior lower lobe on the lateral view.  COPD/emphysema.  Original Report Authenticated By: Rosendo Gros, M.D.     1. Community acquired pneumonia   2. Hypertension       MDM  rx zithromax Take BP meds as directed.  F/u with dr. Loleta Chance tomorrow as planned.        Worthy Rancher, PA 05/07/11 0050  Worthy Rancher, PA 05/07/11 [phone]

## 2011-05-06 NOTE — ED Notes (Signed)
Cough, congestion, wheezing onset 2 weeks ago, getting worse

## 2011-05-06 NOTE — ED Notes (Signed)
Pt reports cough and URI symptoms that have worsened over past 2 weeks.  Per family, pt is to see PCP tomorrow, but began having some SOB and requested to come to ED tonight.

## 2011-05-06 NOTE — ED Notes (Signed)
Patient just returned from X-ray. Pt is asking for something to eat. Checking with nurse .

## 2011-05-07 DIAGNOSIS — I10 Essential (primary) hypertension: Secondary | ICD-10-CM | POA: Diagnosis not present

## 2011-05-07 MED ORDER — AZITHROMYCIN 250 MG PO TABS: ORAL_TABLET | ORAL | Status: DC

## 2011-05-07 NOTE — Discharge Instructions (Signed)
Pneumonia, Adult Pneumonia is an infection of the lungs.  CAUSES Pneumonia may be caused by bacteria or a virus. Usually, these infections are caused by breathing infectious particles into the lungs (respiratory tract). SYMPTOMS   Cough.  Fever. Hypertension As your heart beats, it forces blood through your arteries. This force is your blood pressure. If the pressure is too high, it is called hypertension (HTN) or high blood pressure. HTN is dangerous because you may have it and not know it. High blood pressure may mean that your heart has to work harder to pump blood. Your arteries may be narrow or stiff. The extra work puts you at risk for heart disease, stroke, and other problems.  Blood pressure consists of two numbers, a higher number over a lower, 110/72, for example. It is stated as "110 over 72." The ideal is below 120 for the top number (systolic) and under 80 for the bottom (diastolic). Write down your blood pressure today. You should pay close attention to your blood pressure if you have certain conditions such as: Heart failure.  Prior heart attack.  Diabetes  Chronic kidney disease.  Prior stroke.  Multiple risk factors for heart disease.  To see if you have HTN, your blood pressure should be measured while you are seated with your arm held at the level of the heart. It should be measured at least twice. A one-time elevated blood pressure reading (especially in the Emergency Department) does not mean that you need treatment. There may be conditions in which the blood pressure is different between your right and left arms. It is important to see your caregiver soon for a recheck. Most people have essential hypertension which means that there is not a specific cause. This type of high blood pressure may be lowered by changing lifestyle factors such as: Stress.  Smoking.  Lack of exercise.  Excessive weight.  Drug/tobacco/alcohol use.  Eating less salt.  Most people do not have  symptoms from high blood pressure until it has caused damage to the body. Effective treatment can often prevent, delay or reduce that damage. TREATMENT  When a cause has been identified, treatment for high blood pressure is directed at the cause. There are a large number of medications to treat HTN. These fall into several categories, and your caregiver will help you select the medicines that are best for you. Medications may have side effects. You should review side effects with your caregiver. If your blood pressure stays high after you have made lifestyle changes or started on medicines,  Your medication(s) may need to be changed.  Other problems may need to be addressed.  Be certain you understand your prescriptions, and know how and when to take your medicine.  Be sure to follow up with your caregiver within the time frame advised (usually within two weeks) to have your blood pressure rechecked and to review your medications.  If you are taking more than one medicine to lower your blood pressure, make sure you know how and at what times they should be taken. Taking two medicines at the same time can result in blood pressure that is too low.  SEEK IMMEDIATE MEDICAL CARE IF: You develop a severe headache, blurred or changing vision, or confusion.  You have unusual weakness or numbness, or a faint feeling.  You have severe chest or abdominal pain, vomiting, or breathing problems.  MAKE SURE YOU:  Understand these instructions.  Will watch your condition.  Will get help right away if  you are not doing well or get worse.  Document Released: 01/28/2005 Document Revised: 01/17/2011 Document Reviewed: 09/18/2007  [facility] Patient Information 2012 [place], [place].  Chest pain.   Increased rate of breathing.   Wheezing.   Mucus production.  DIAGNOSIS  If you have the common symptoms of pneumonia, your caregiver will typically confirm the diagnosis with a chest X-ray. The X-ray will show an  abnormality in the lung (pulmonary infiltrate) if you have pneumonia. Other tests of your blood, urine, or sputum may be done to find the specific cause of your pneumonia. Your caregiver may also do tests (blood gases or pulse oximetry) to see how well your lungs are working. TREATMENT  Some forms of pneumonia may be spread to other people when you cough or sneeze. You may be asked to wear a mask before and during your exam. Pneumonia that is caused by bacteria is treated with antibiotic medicine. Pneumonia that is caused by the influenza virus may be treated with an antiviral medicine. Most other viral infections must run their course. These infections will not respond to antibiotics.  PREVENTION A pneumococcal shot (vaccine) is available to prevent a common bacterial cause of pneumonia. This is usually suggested for:  People over 81 years old.   Patients on chemotherapy.   People with chronic lung problems, such as bronchitis or emphysema.   People with immune system problems.  If you are over 65 or have a high risk condition, you may receive the pneumococcal vaccine if you have not received it before. In some countries, a routine influenza vaccine is also recommended. This vaccine can help prevent some cases of pneumonia.You may be offered the influenza vaccine as part of your care. If you smoke, it is time to quit. You may receive instructions on how to stop smoking. Your caregiver can provide medicines and counseling to help you quit. HOME CARE INSTRUCTIONS   Cough suppressants may be used if you are losing too much rest. However, coughing protects you by clearing your lungs. You should avoid using cough suppressants if you can.   Your caregiver may have prescribed medicine if he or she thinks your pneumonia is caused by a bacteria or influenza. Finish your medicine even if you start to feel better.   Your caregiver may also prescribe an expectorant. This loosens the mucus to be coughed  up.   Only take over-the-counter or prescription medicines for pain, discomfort, or fever as directed by your caregiver.   Do not smoke. Smoking is a common cause of bronchitis and can contribute to pneumonia. If you are a smoker and continue to smoke, your cough may last several weeks after your pneumonia has cleared.   A cold steam vaporizer or humidifier in your room or home may help loosen mucus.   Coughing is often worse at night. Sleeping in a semi-upright position in a recliner or using a couple pillows under your head will help with this.   Get rest as you feel it is needed. Your body will usually let you know when you need to rest.  SEEK IMMEDIATE MEDICAL CARE IF:   Your illness becomes worse. This is especially true if you are elderly or weakened from any other disease.   You cannot control your cough with suppressants and are losing sleep.   You begin coughing up blood.   You develop pain which is getting worse or is uncontrolled with medicines.   You have a fever.   Any of the symptoms  which initially brought you in for treatment are getting worse rather than better.   You develop shortness of breath or chest pain.  MAKE SURE YOU:   Understand these instructions.   Will watch your condition.   Will get help right away if you are not doing well or get worse.  Document Released: 01/28/2005 Document Revised: 01/17/2011 Document Reviewed: 04/19/2010 [facility] Patient Information 2012 [place], [place].   Take the antibiotic as directed.  Take your blood pressure medicines as prescribed.  Follow up with dr. Loleta Chance tomorrow as planned.

## 2011-05-08 NOTE — ED Provider Notes (Signed)
Medical screening examination/treatment/procedure(s) were performed by non-physician practitioner and as supervising physician I was immediately available for consultation/collaboration.   Benny Lennert, MD 05/08/11 [phone]

## 2011-07-16 DIAGNOSIS — I10 Essential (primary) hypertension: Secondary | ICD-10-CM | POA: Diagnosis not present

## 2011-07-25 ENCOUNTER — Emergency Department (HOSPITAL_COMMUNITY)
Admission: EM | Admit: 2011-07-25 | Discharge: 2011-07-25 | Discharge status: Against Medical Advice | Payer: Medicare Other

## 2011-07-25 DIAGNOSIS — F209 Schizophrenia, unspecified: Secondary | ICD-10-CM | POA: Diagnosis not present

## 2011-11-08 DIAGNOSIS — J209 Acute bronchitis, unspecified: Secondary | ICD-10-CM | POA: Diagnosis not present

## 2011-12-12 DIAGNOSIS — F209 Schizophrenia, unspecified: Secondary | ICD-10-CM | POA: Diagnosis not present

## 2012-01-06 ENCOUNTER — Encounter (HOSPITAL_COMMUNITY): Payer: Self-pay | Admitting: Emergency Medicine

## 2012-01-06 ENCOUNTER — Emergency Department (HOSPITAL_COMMUNITY)
Admission: EM | Admit: 2012-01-06 | Discharge: 2012-01-06 | Disposition: A | Discharge status: Home / Self Care | Payer: Medicare Other | Attending: Emergency Medicine | Admitting: Emergency Medicine

## 2012-01-06 DIAGNOSIS — F17200 Nicotine dependence, unspecified, uncomplicated: Secondary | ICD-10-CM | POA: Insufficient documentation

## 2012-01-06 DIAGNOSIS — Z79899 Other long term (current) drug therapy: Secondary | ICD-10-CM | POA: Diagnosis not present

## 2012-01-06 DIAGNOSIS — N390 Urinary tract infection, site not specified: Secondary | ICD-10-CM

## 2012-01-06 DIAGNOSIS — E876 Hypokalemia: Secondary | ICD-10-CM | POA: Diagnosis not present

## 2012-01-06 DIAGNOSIS — I10 Essential (primary) hypertension: Secondary | ICD-10-CM | POA: Insufficient documentation

## 2012-01-06 DIAGNOSIS — F209 Schizophrenia, unspecified: Secondary | ICD-10-CM | POA: Diagnosis not present

## 2012-01-06 DIAGNOSIS — Z7982 Long term (current) use of aspirin: Secondary | ICD-10-CM | POA: Insufficient documentation

## 2012-01-06 LAB — URINALYSIS, ROUTINE W REFLEX MICROSCOPIC
Glucose, UA: NEGATIVE mg/dL
Ketones, ur: 15 mg/dL — AB
Leukocytes, UA: NEGATIVE
Protein, ur: 100 mg/dL — AB
pH: 6 (ref 5.0–8.0)

## 2012-01-06 LAB — BASIC METABOLIC PANEL
BUN: 18 mg/dL (ref 6–23)
Calcium: 10.2 mg/dL (ref 8.4–10.5)
GFR calc Af Amer: 55 mL/min — ABNORMAL LOW (ref >90)
GFR calc non Af Amer: 47 mL/min — ABNORMAL LOW (ref >90)
Glucose, Bld: 192 mg/dL — ABNORMAL HIGH (ref 70–99)
Potassium: 3.1 mEq/L — ABNORMAL LOW (ref 3.5–5.1)

## 2012-01-06 MED ORDER — SULFAMETHOXAZOLE-TRIMETHOPRIM 800-160 MG PO TABS: 1.0000 | ORAL_TABLET | Freq: Two times a day (BID) | ORAL | Status: DC

## 2012-01-06 MED ORDER — POTASSIUM CHLORIDE ER 10 MEQ PO TBCR: 10.0000 meq | EXTENDED_RELEASE_TABLET | Freq: Two times a day (BID) | ORAL | Status: DC

## 2012-01-06 MED ORDER — NITROFURANTOIN MONOHYD MACRO 100 MG PO CAPS: 100.0000 mg | ORAL_CAPSULE | Freq: Two times a day (BID) | ORAL | Status: DC

## 2012-01-06 MED ORDER — POTASSIUM CHLORIDE CRYS ER 20 MEQ PO TBCR
20.0000 meq | EXTENDED_RELEASE_TABLET | Freq: Once | ORAL | Status: AC
  Administered 2012-01-06: 20 meq via ORAL
  Filled 2012-01-06: qty 1

## 2012-01-06 MED ORDER — SULFAMETHOXAZOLE-TMP DS 800-160 MG PO TABS: 1.0000 | ORAL_TABLET | Freq: Once | ORAL | Status: DC

## 2012-01-06 NOTE — ED Provider Notes (Signed)
Medical screening examination/treatment/procedure(s) were performed by non-physician practitioner and as supervising physician I was immediately available for consultation/collaboration.  Jones Skene, M.D.     Jones Skene, MD 01/06/12 1652

## 2012-01-06 NOTE — ED Notes (Signed)
Pt family reports patient unable to urinate. Pt unable to give history.

## 2012-01-06 NOTE — ED Provider Notes (Signed)
History     CSN: [id number]  Arrival date & time 01/06/12  4098   First MD Initiated Contact with Patient 01/06/12 0831      Chief Complaint  Patient presents with  . Urinary Retention    (Consider location/radiation/quality/duration/timing/severity/associated sxs/prior treatment) HPI Comments: Brittney Wall comes to the ed with her daughters who are concerned about patient having difficulty and pain with urination which the patient denies at this time.  She has a history of schizophrenia and does not have good insight into her medical conditions according to the daughters,  Stating she often refuses and/or denies assistance and intervention.  Her daughter is her primary caregiver who states she takes a long to urinate and feels she is not urinating frequently enough.  There have been no documented fevers, nausea or vomiting and no hematuria noted.  Patient denies flank and abdominal pain.  The history is provided by a relative. The history is limited by the condition of the patient.    Past Medical History  Diagnosis Date  . Hypertension   . Schizophrenia     Past Surgical History  Procedure Date  . Ortho procedure     History reviewed. No pertinent family history.  History  Substance Use Topics  . Smoking status: Current Every Day Smoker -- 2.0 packs/day  . Smokeless tobacco: Not on file  . Alcohol Use: No    OB History    Grav Para Term Preterm Abortions TAB SAB Ect Mult Living                  Review of Systems  Unable to perform ROS: Psychiatric disorder    Allergies  Review of patient's allergies indicates no known allergies.  Home Medications   Current Outpatient Rx  Name  Route  Sig  Dispense  Refill  . ASPIRIN EC 81 MG PO TBEC   Oral   Take 81 mg by mouth daily.           Marland Kitchen CLONIDINE HCL 0.1 MG PO TABS   Oral   Take 0.1 mg by mouth 3 (three) times daily.           Marland Kitchen HALOPERIDOL DECANOATE 100 MG/ML IM SOLN   Intramuscular   Inject 100  mg into the muscle every 28 (twenty-eight) days.         Marland Kitchen HYDRALAZINE HCL 10 MG PO TABS   Oral   Take 10 mg by mouth at bedtime.          Marland Kitchen LORAZEPAM 0.5 MG PO TABS   Oral   Take 0.5 mg by mouth every 8 (eight) hours as needed. For anixety         . MIRTAZAPINE 30 MG PO TABS   Oral   Take 30 mg by mouth at bedtime.           Marland Kitchen PRAVASTATIN SODIUM 20 MG PO TABS   Oral   Take 20 mg by mouth daily.         . TRAZODONE HCL 50 MG PO TABS   Oral   Take 50 mg by mouth at bedtime.           Marland Kitchen NITROFURANTOIN MONOHYD MACRO 100 MG PO CAPS   Oral   Take 1 capsule (100 mg total) by mouth 2 (two) times daily.   10 capsule   0   . POTASSIUM CHLORIDE ER 10 MEQ PO TBCR   Oral   Take 1 tablet (10  mEq total) by mouth 2 (two) times daily.   14 tablet   0     BP 139/81  Pulse 114  Temp 98.1 F (36.7 C) (Oral)  Resp 18  Ht 5\' 6"  (1.676 m)  Wt 150 lb (68.04 kg)  BMI 24.21 kg/m2  SpO2 97%  Physical Exam  Nursing note and vitals reviewed. Constitutional: She appears well-developed and well-nourished. No distress.  HENT:  Head: Normocephalic and atraumatic.  Eyes: Conjunctivae normal are normal.  Neck: Normal range of motion.  Cardiovascular: Normal rate, regular rhythm, normal heart sounds and intact distal pulses.   Pulmonary/Chest: Effort normal and breath sounds normal. She has no wheezes.  Abdominal: Soft. Bowel sounds are normal. There is no tenderness.  Musculoskeletal: Normal range of motion.  Neurological: She is alert.  Skin: Skin is warm and dry.  Psychiatric: Her affect is blunt.       Patient is not agitated,  But answers "No" to all questions asked.    ED Course  Procedures (including critical care time)  Labs Reviewed  URINALYSIS, ROUTINE W REFLEX MICROSCOPIC - Abnormal; Notable for the following:    Specific Gravity, Urine >1.030 (*)     Hgb urine dipstick LARGE (*)     Bilirubin Urine MODERATE (*)     Ketones, ur 15 (*)     Protein, ur 100  (*)     All other components within normal limits  BASIC METABOLIC PANEL - Abnormal; Notable for the following:    Potassium 3.1 (*)     Glucose, Bld 192 (*)     Creatinine, Ser 1.18 (*)     GFR calc non Af Amer 47 (*)     GFR calc Af Amer 55 (*)     All other components within normal limits  URINE MICROSCOPIC-ADD ON - Abnormal; Notable for the following:    Bacteria, UA MANY (*)  MANY   All other components within normal limits  URINE CULTURE   No results found.   1. UTI (lower urinary tract infection)   2. Hypokalemia       MDM  Pt prescribed macrobid,  Potassium supplementation.  Discussed repeat ua next week by pcp,  Recheck sooner for fever, vomiting,  Worsened sx.  The patient appears reasonably screened and/or stabilized for discharge and I doubt any other medical condition or other [facility] requiring further screening, evaluation, or treatment in the ED at this time prior to discharge.  Also discussed elevated cbg and need to get this rechecked by pcp.         Burgess Amor, PA 01/06/12 1006

## 2012-01-07 LAB — URINE CULTURE
Colony Count: NO GROWTH
Culture: NO GROWTH

## 2012-01-10 ENCOUNTER — Emergency Department (HOSPITAL_COMMUNITY): Payer: Medicare Other

## 2012-01-10 ENCOUNTER — Emergency Department (HOSPITAL_COMMUNITY)
Admission: EM | Admit: 2012-01-10 | Discharge: 2012-01-10 | Disposition: A | Discharge status: Home / Self Care | Payer: Medicare Other | Attending: Emergency Medicine | Admitting: Emergency Medicine

## 2012-01-10 ENCOUNTER — Encounter (HOSPITAL_COMMUNITY): Payer: Self-pay | Admitting: Emergency Medicine

## 2012-01-10 DIAGNOSIS — F209 Schizophrenia, unspecified: Secondary | ICD-10-CM | POA: Insufficient documentation

## 2012-01-10 DIAGNOSIS — F17200 Nicotine dependence, unspecified, uncomplicated: Secondary | ICD-10-CM | POA: Diagnosis not present

## 2012-01-10 DIAGNOSIS — R350 Frequency of micturition: Secondary | ICD-10-CM | POA: Insufficient documentation

## 2012-01-10 DIAGNOSIS — M21372 Foot drop, left foot: Secondary | ICD-10-CM

## 2012-01-10 DIAGNOSIS — R5383 Other fatigue: Secondary | ICD-10-CM | POA: Diagnosis not present

## 2012-01-10 DIAGNOSIS — M216X9 Other acquired deformities of unspecified foot: Secondary | ICD-10-CM | POA: Insufficient documentation

## 2012-01-10 DIAGNOSIS — Z7982 Long term (current) use of aspirin: Secondary | ICD-10-CM | POA: Insufficient documentation

## 2012-01-10 DIAGNOSIS — R4182 Altered mental status, unspecified: Secondary | ICD-10-CM | POA: Diagnosis not present

## 2012-01-10 DIAGNOSIS — M47817 Spondylosis without myelopathy or radiculopathy, lumbosacral region: Secondary | ICD-10-CM | POA: Diagnosis not present

## 2012-01-10 DIAGNOSIS — Z79899 Other long term (current) drug therapy: Secondary | ICD-10-CM | POA: Diagnosis not present

## 2012-01-10 DIAGNOSIS — M5137 Other intervertebral disc degeneration, lumbosacral region: Secondary | ICD-10-CM | POA: Diagnosis not present

## 2012-01-10 DIAGNOSIS — Z8659 Personal history of other mental and behavioral disorders: Secondary | ICD-10-CM | POA: Insufficient documentation

## 2012-01-10 DIAGNOSIS — I10 Essential (primary) hypertension: Secondary | ICD-10-CM | POA: Diagnosis not present

## 2012-01-10 DIAGNOSIS — R262 Difficulty in walking, not elsewhere classified: Secondary | ICD-10-CM | POA: Diagnosis not present

## 2012-01-10 HISTORY — DX: Unspecified dementia, unspecified severity, without behavioral disturbance, psychotic disturbance, mood disturbance, and anxiety: F03.90

## 2012-01-10 LAB — COMPREHENSIVE METABOLIC PANEL
BUN: 15 mg/dL (ref 6–23)
CO2: 23 mEq/L (ref 19–32)
Calcium: 9.8 mg/dL (ref 8.4–10.5)
Chloride: 92 mEq/L — ABNORMAL LOW (ref 96–112)
Creatinine, Ser: 0.68 mg/dL (ref 0.50–1.10)
GFR calc non Af Amer: 90 mL/min — ABNORMAL LOW (ref >90)
Total Bilirubin: 0.8 mg/dL (ref 0.3–1.2)

## 2012-01-10 LAB — URINALYSIS, ROUTINE W REFLEX MICROSCOPIC
Glucose, UA: NEGATIVE mg/dL
Hgb urine dipstick: NEGATIVE
Specific Gravity, Urine: <1.005 — ABNORMAL LOW (ref 1.005–1.030)
pH: 6 (ref 5.0–8.0)

## 2012-01-10 LAB — CBC WITH DIFFERENTIAL/PLATELET
Lymphocytes Relative: 19 % (ref 12–46)
Lymphs Abs: 1.4 10*3/uL (ref 0.7–4.0)
Neutro Abs: 5.2 10*3/uL (ref 1.7–7.7)
Neutrophils Relative %: 72 % (ref 43–77)
Platelets: 264 10*3/uL (ref 150–400)
RBC: 5.03 MIL/uL (ref 3.87–5.11)
WBC: 7.2 10*3/uL (ref 4.0–10.5)

## 2012-01-10 LAB — URINE MICROSCOPIC-ADD ON

## 2012-01-10 MED ORDER — HALOPERIDOL LACTATE 5 MG/ML IJ SOLN
5.0000 mg | Freq: Once | INTRAMUSCULAR | Status: AC
  Administered 2012-01-10: 5 mg via INTRAMUSCULAR
  Filled 2012-01-10: qty 1

## 2012-01-10 NOTE — ED Notes (Signed)
Attempted to obtain urine specimen. Pt feels urge to go, placed on bedside commode. Pt voided, however put toilet paper in commode. Spec unusable

## 2012-01-10 NOTE — ED Notes (Signed)
Pt  back in ED@ 1535, Under guise of having to have her saline lock removed. ERMD w/pt and daughter. Pt has agreed to CT. Transported there with daughter in attendance

## 2012-01-10 NOTE — ED Notes (Signed)
Daughter states pt here Monday for uti and has only one pill left of antibiotic. Daughter states after she got her home pt started having a limp and needed slight lifting assistance to walk. Started getting worse yesterday. Pt hx of dementia. Pt denies pain/trouble walking or trouble urinating. Daughter states pt sitting on toilet a lot prior to urinating. Daughter denies pt leaning to one side, slurred speech.

## 2012-01-10 NOTE — ED Notes (Signed)
Pt sitting on bedside commode again. Refused to go back to xr for cxr.. Initially refused haldol but then abruptly offered upper arm. Injection completed w/o incident.

## 2012-01-10 NOTE — ED Notes (Signed)
Resting quietly with daughter in room, waiting for disposition

## 2012-01-10 NOTE — ED Notes (Signed)
Waiting to be seen by ERMD. Becoming agitated. Trying to dress self by attempting to wear her purse. Daughter at bedside. ERMD aware.

## 2012-01-10 NOTE — ED Notes (Signed)
Pt now fully dressed and walking out of ED. Her daughter is trying to get her to stay. Has told pt they have no ride home.pt states she doesn't care, "I want to go" . Pt able to walk but left foot has a slap gait, which daughter states is new since 2 days ago. Dr. Clarene Duke  Aware. Per her, pt's daughter will have to get a petition to have her brought back into ED. Pt is now @ 1530, standing outside on side of road with daughter and our security. [place] police have been called.

## 2012-01-10 NOTE — ED Notes (Addendum)
Fully dressed but calmer

## 2012-01-10 NOTE — ED Notes (Signed)
Slightly calmer. To xr via w/c

## 2012-01-10 NOTE — ED Provider Notes (Signed)
History     CSN: [id number]  Arrival date & time 01/10/12  1251   First MD Initiated Contact with Patient 01/10/12 1449      Chief Complaint  Patient presents with  . Difficulty Walking     The history is provided by the patient, a caregiver and a relative. The history is limited by the condition of the patient (Hx schizophrenia).  Pt was seen at 1530.  Pt was brought in by her daughter today stating for the past 3 days pt has been walking with her left foot "slapping" when she walks.  Pt also has been "urinating a lot" for the past several days.  Pt was eval in ED 4d ago for her daughter feeling pt was "not urinating enough," dx UTI, tx abx which she is still taking.  Pt denies any complaints and wants to go home. Denies pt had fallen, no N/V/D, no fevers, no slurred speech, no facial droop, no focal motor weakness, no incont of bowel/bladder, no back pain.      Past Medical History  Diagnosis Date  . Hypertension   . Schizophrenia   . Dementia     Past Surgical History  Procedure Date  . Ortho procedure     History  Substance Use Topics  . Smoking status: Current Every Day Smoker -- 2.0 packs/day  . Smokeless tobacco: Not on file  . Alcohol Use: No      Review of Systems  Unable to perform ROS: Psychiatric disorder    Allergies  Review of patient's allergies indicates no known allergies.  Home Medications   Current Outpatient Rx  Name  Route  Sig  Dispense  Refill  . ASPIRIN EC 81 MG PO TBEC   Oral   Take 81 mg by mouth daily.           Marland Kitchen CLONIDINE HCL 0.1 MG PO TABS   Oral   Take 0.1 mg by mouth 3 (three) times daily.           Marland Kitchen HALOPERIDOL DECANOATE 100 MG/ML IM SOLN   Intramuscular   Inject 100 mg into the muscle every 28 (twenty-eight) days.         Marland Kitchen HYDRALAZINE HCL 10 MG PO TABS   Oral   Take 10 mg by mouth at bedtime.          Marland Kitchen LORAZEPAM 0.5 MG PO TABS   Oral   Take 0.5 mg by mouth every 8 (eight) hours as needed. For anixety        . MIRTAZAPINE 30 MG PO TABS   Oral   Take 30 mg by mouth at bedtime.           Marland Kitchen NITROFURANTOIN MONOHYD MACRO 100 MG PO CAPS   Oral   Take 1 capsule (100 mg total) by mouth 2 (two) times daily.   10 capsule   0   . POTASSIUM CHLORIDE ER 10 MEQ PO TBCR   Oral   Take 1 tablet (10 mEq total) by mouth 2 (two) times daily.   14 tablet   0   . PRAVASTATIN SODIUM 20 MG PO TABS   Oral   Take 20 mg by mouth daily.         . TRAZODONE HCL 50 MG PO TABS   Oral   Take 50 mg by mouth at bedtime.             BP 144/80  Pulse 78  Temp 97.9 F (  36.6 C) (Oral)  Resp 18  SpO2 99%  Physical Exam 1535: Physical examination:  Nursing notes reviewed; Vital signs and O2 SAT reviewed;  Constitutional: Well developed, Well nourished, Well hydrated, In no acute distress; Head:  Normocephalic, atraumatic; Eyes: EOMI, PERRL, No scleral icterus; ENMT: Mouth and pharynx normal, Mucous membranes moist; Neck: Supple, Full range of motion, No lymphadenopathy; Cardiovascular: Regular rate and rhythm, No gallop; Respiratory: Breath sounds clear & equal bilaterally, No rales, rhonchi, wheezes.  Speaking full sentences with ease, Normal respiratory effort/excursion; Chest: Nontender, Movement normal; Abdomen: Soft, Nontender, Nondistended, Normal bowel sounds; Genitourinary: No CVA tenderness; Extremities: Pulses normal, No tenderness, No edema, No calf tenderness, edema or asymmetry.; Neuro: Awake, alert, confused re: time, place, events. Major CN grossly intact.  Speech clear. No facial droop.  Strength 5/5 equal bilat UE's and LE's, no drift x4 extremities. Able to alternate crossing her legs and arms past midline without difficulty.  No gross focal motor or sensory deficits in extremities. +left footdrop when ambulating.; Skin: Color normal, Warm, Dry.; Psych:  Easily agitated, largely uncooperative with exam.    ED Course  Procedures   1535:  Pt was brought to the ED by her eldest daughter  who "wants her checked out again."  Pt is largely uncooperative with staff and actually climbed off the stretcher, got herself dressed, picked up her purse and walked out of the ED via the ambulance bay doors.  Pt walks with left footdrop gait, not unsteady or ataxic.  Will not allow me to examine her further than what I have completed above.  ACT Felissa (who knows pt) in to speak with pt and family, recommends IM haldol to help calm pt, as she is used to receiving this q monthly.    MDM  MDM Reviewed: previous chart, nursing note and vitals Reviewed previous: labs Interpretation: labs, x-ray and CT scan   Results for orders placed during the hospital encounter of 01/10/12  GLUCOSE, CAPILLARY      Component Value Range   Glucose-Capillary 124 (*) 70 - 99 mg/dL   Comment 1 Documented in Chart     Comment 2 Notify RN    URINALYSIS, ROUTINE W REFLEX MICROSCOPIC      Component Value Range   Color, Urine YELLOW  YELLOW   APPearance CLEAR  CLEAR   Specific Gravity, Urine <1.005 (*) 1.005 - 1.030   pH 6.0  5.0 - 8.0   Glucose, UA NEGATIVE  NEGATIVE mg/dL   Hgb urine dipstick NEGATIVE  NEGATIVE   Bilirubin Urine NEGATIVE  NEGATIVE   Ketones, ur 15 (*) NEGATIVE mg/dL   Protein, ur NEGATIVE  NEGATIVE mg/dL   Urobilinogen, UA 0.2  0.0 - 1.0 mg/dL   Nitrite NEGATIVE  NEGATIVE   Leukocytes, UA TRACE (*) NEGATIVE  URINE MICROSCOPIC-ADD ON      Component Value Range   Squamous Epithelial / LPF MANY (*) RARE   WBC, UA 3-6  <3 WBC/hpf   Bacteria, UA FEW (*) RARE  COMPREHENSIVE METABOLIC PANEL      Component Value Range   Sodium 132 (*) 135 - 145 mEq/L   Potassium 3.6  3.5 - 5.1 mEq/L   Chloride 92 (*) 96 - 112 mEq/L   CO2 23  19 - 32 mEq/L   Glucose, Bld 109 (*) 70 - 99 mg/dL   BUN 15  6 - 23 mg/dL   Creatinine, Ser 1.61  0.50 - 1.10 mg/dL   Calcium 9.8  8.4 - 09.6  mg/dL   Total Protein 6.9  6.0 - 8.3 g/dL   Albumin 3.8  3.5 - 5.2 g/dL   AST 70 (*) 0 - 37 U/L   ALT 28  0 - 35 U/L    Alkaline Phosphatase 74  39 - 117 U/L   Total Bilirubin 0.8  0.3 - 1.2 mg/dL   GFR calc non Af Amer 90 (*) >90 mL/min   GFR calc Af Amer >90  >90 mL/min  CBC WITH DIFFERENTIAL      Component Value Range   WBC 7.2  4.0 - 10.5 K/uL   RBC 5.03  3.87 - 5.11 MIL/uL   Hemoglobin 14.5  12.0 - 15.0 g/dL   HCT 96.0  45.4 - 09.8 %   MCV 83.1  78.0 - 100.0 fL   MCH 28.8  26.0 - 34.0 pg   MCHC 34.7  30.0 - 36.0 g/dL   RDW 11.9  14.7 - 82.9 %   Platelets 264  150 - 400 K/uL   Neutrophils Relative 72  43 - 77 %   Neutro Abs 5.2  1.7 - 7.7 K/uL   Lymphocytes Relative 19  12 - 46 %   Lymphs Abs 1.4  0.7 - 4.0 K/uL   Monocytes Relative 7  3 - 12 %   Monocytes Absolute 0.5  0.1 - 1.0 K/uL   Eosinophils Relative 1  0 - 5 %   Eosinophils Absolute 0.1  0.0 - 0.7 K/uL   Basophils Relative 1  0 - 1 %   Basophils Absolute 0.0  0.0 - 0.1 K/uL   Dg Chest 2 View 01/10/2012  *RADIOLOGY REPORT*  Clinical Data: Acute mental status changes.  Generalized weakness.  CHEST - 2 VIEW  Comparison: Two-view chest x-ray 05/06/2011, 02/28/2009, 02/08/2008.  Findings: Cardiac silhouette normal in size, unchanged.  Thoracic aorta tortuous and mildly atherosclerotic, unchanged. Hilar and mediastinal contours otherwise unremarkable.  Lungs clear. Bronchovascular markings normal.  Pulmonary vascularity normal.  No pneumothorax.  No pleural effusions.  Degenerative changes involving the thoracic spine.  No significant interval change.  IMPRESSION: No acute cardiopulmonary disease.  Stable examination.   Original Report Authenticated By: Hulan Saas, M.D.    Dg Lumbar Spine Complete 01/10/2012  *RADIOLOGY REPORT*  Clinical Data: Acute mental status changes.  Gait disturbance.  LUMBAR SPINE - COMPLETE 4+ VIEW  Comparison: None.  Findings: Five non-rib bearing lumbar vertebrae with anatomic alignment.  No fractures.  Moderate disc space narrowing endplate hypertrophic changes at L4-5 and L5-S1.  Mild disc space narrowing and  endplate hypertrophic changes at L2-3 and L3-4.  Facet degenerative changes at L4-5 and L5-S1.  No pars defects. Sacroiliac joints intact with degenerative changes.  Aorto-iliac atherosclerosis without aneurysm.  Lower thoracic spondylosis.  IMPRESSION: No acute or subacute osseous abnormality.  Mild to moderate multilevel degenerative disc disease and spondylosis.  Facet degenerative changes at L4-5 and L5-S1.   Original Report Authenticated By: Hulan Saas, M.D.    Ct Head Wo Contrast 01/10/2012  *RADIOLOGY REPORT*  Clinical Data: Difficulty walking  CT HEAD WITHOUT CONTRAST  Technique:  Contiguous axial images were obtained from the base of the skull through the vertex without contrast.  Comparison: 03/16/2008  Findings: No skull fracture is noted.  Paranasal sinuses and mastoid air cells are unremarkable.  No intracranial hemorrhage, mass effect or midline shift.  Patchy periventricular and subcortical white matter decreased attenuation consistent with chronic small vessel ischemic changes again noted.  No acute infarction.  No mass  lesion is noted on this unenhanced scan.  IMPRESSION: No acute intracranial abnormality.  Stable chronic white matter disease.   Original Report Authenticated By: Natasha Mead, M.D.      1945:  Pt and her daughter want to leave now.  Pt has been walking around the ED and has walked down the hallway and out of the ED several times; with her daughter bringing her back into the ED to await her dx testing results.  Both state that they "just want to go home now."  Pt's gait has been steady and upright, +left footdrop. No ataxia, not unsteady, no falls. No acute findings on workup today, VS remain stable, neuro exam unchanged, UC is pending (Udip without obvious UTI).  CT-H without CVA after 3 days of constant symptoms.  LS without acute abnl.  Pt will not tolerate MRI; family strongly agrees and does not want pt to have one at this time.  They just want to leave now.  Dx and  testing d/w pt's family.  Questions answered.  Verb understanding, agreeable to d/c home with outpt f/u with pt's PMD.          Laray Anger, DO 01/13/12 1348

## 2012-01-10 NOTE — ED Notes (Signed)
ERMD aware of pt. Per Dr.Glick, we will wait for Provider to see pt before proceeding further.

## 2012-01-12 LAB — URINE CULTURE: Culture: NO GROWTH

## 2012-01-14 DIAGNOSIS — J449 Chronic obstructive pulmonary disease, unspecified: Secondary | ICD-10-CM | POA: Diagnosis not present

## 2012-01-14 DIAGNOSIS — I10 Essential (primary) hypertension: Secondary | ICD-10-CM | POA: Diagnosis not present

## 2012-02-12 ENCOUNTER — Emergency Department (HOSPITAL_COMMUNITY): Payer: Medicare Other

## 2012-02-12 ENCOUNTER — Emergency Department (HOSPITAL_COMMUNITY)
Admission: EM | Admit: 2012-02-12 | Discharge: 2012-02-12 | Disposition: A | Discharge status: Home / Self Care | Payer: Medicare Other | Attending: Emergency Medicine | Admitting: Emergency Medicine

## 2012-02-12 ENCOUNTER — Encounter (HOSPITAL_COMMUNITY): Payer: Self-pay

## 2012-02-12 DIAGNOSIS — R05 Cough: Secondary | ICD-10-CM | POA: Diagnosis not present

## 2012-02-12 DIAGNOSIS — I10 Essential (primary) hypertension: Secondary | ICD-10-CM | POA: Insufficient documentation

## 2012-02-12 DIAGNOSIS — R3589 Other polyuria: Secondary | ICD-10-CM | POA: Diagnosis not present

## 2012-02-12 DIAGNOSIS — F17200 Nicotine dependence, unspecified, uncomplicated: Secondary | ICD-10-CM | POA: Insufficient documentation

## 2012-02-12 DIAGNOSIS — R358 Other polyuria: Secondary | ICD-10-CM | POA: Insufficient documentation

## 2012-02-12 DIAGNOSIS — R059 Cough, unspecified: Secondary | ICD-10-CM | POA: Insufficient documentation

## 2012-02-12 DIAGNOSIS — F29 Unspecified psychosis not due to a substance or known physiological condition: Secondary | ICD-10-CM | POA: Insufficient documentation

## 2012-02-12 DIAGNOSIS — R630 Anorexia: Secondary | ICD-10-CM | POA: Diagnosis not present

## 2012-02-12 DIAGNOSIS — R0602 Shortness of breath: Secondary | ICD-10-CM | POA: Diagnosis not present

## 2012-02-12 DIAGNOSIS — R410 Disorientation, unspecified: Secondary | ICD-10-CM

## 2012-02-12 DIAGNOSIS — R4182 Altered mental status, unspecified: Secondary | ICD-10-CM | POA: Diagnosis not present

## 2012-02-12 DIAGNOSIS — E871 Hypo-osmolality and hyponatremia: Secondary | ICD-10-CM | POA: Diagnosis not present

## 2012-02-12 DIAGNOSIS — F0390 Unspecified dementia without behavioral disturbance: Secondary | ICD-10-CM | POA: Diagnosis not present

## 2012-02-12 DIAGNOSIS — Z7982 Long term (current) use of aspirin: Secondary | ICD-10-CM | POA: Diagnosis not present

## 2012-02-12 DIAGNOSIS — R350 Frequency of micturition: Secondary | ICD-10-CM | POA: Diagnosis not present

## 2012-02-12 DIAGNOSIS — F209 Schizophrenia, unspecified: Secondary | ICD-10-CM | POA: Insufficient documentation

## 2012-02-12 LAB — BASIC METABOLIC PANEL
BUN: 8 mg/dL (ref 6–23)
BUN: 8 mg/dL (ref 6–23)
Calcium: 9.1 mg/dL (ref 8.4–10.5)
Calcium: 8.9 mg/dL (ref 8.4–10.5)
Creatinine, Ser: 0.89 mg/dL (ref 0.50–1.10)
Creatinine, Ser: 0.78 mg/dL (ref 0.50–1.10)
GFR calc Af Amer: >90 mL/min (ref >90)
GFR calc non Af Amer: 67 mL/min — ABNORMAL LOW (ref >90)
GFR calc non Af Amer: 86 mL/min — ABNORMAL LOW (ref >90)
Glucose, Bld: 150 mg/dL — ABNORMAL HIGH (ref 70–99)
Potassium: 2.9 mEq/L — ABNORMAL LOW (ref 3.5–5.1)
Sodium: 126 mEq/L — ABNORMAL LOW (ref 135–145)

## 2012-02-12 LAB — URINALYSIS, ROUTINE W REFLEX MICROSCOPIC
Glucose, UA: NEGATIVE mg/dL
Leukocytes, UA: NEGATIVE
Protein, ur: NEGATIVE mg/dL
Specific Gravity, Urine: 1.01 (ref 1.005–1.030)
pH: 6 (ref 5.0–8.0)

## 2012-02-12 LAB — CBC WITH DIFFERENTIAL/PLATELET
Eosinophils Absolute: 0 10*3/uL (ref 0.0–0.7)
Eosinophils Relative: 0 % (ref 0–5)
Lymphs Abs: 0.5 10*3/uL — ABNORMAL LOW (ref 0.7–4.0)
MCH: 29 pg (ref 26.0–34.0)
MCV: 81.5 fL (ref 78.0–100.0)
Platelets: 293 10*3/uL (ref 150–400)
RBC: 4.21 MIL/uL (ref 3.87–5.11)

## 2012-02-12 MED ORDER — SODIUM CHLORIDE 0.9 % IV SOLN: INTRAVENOUS | Status: DC

## 2012-02-12 MED ORDER — SODIUM CHLORIDE 0.9 % IV BOLUS (SEPSIS)
250.0000 mL | Freq: Once | INTRAVENOUS | Status: AC
  Administered 2012-02-12: 250 mL via INTRAVENOUS

## 2012-02-12 NOTE — ED Notes (Signed)
Meal tray ordered for pt  

## 2012-02-12 NOTE — ED Notes (Signed)
Meal tray given to pt.

## 2012-02-12 NOTE — ED Notes (Signed)
Family given something to eat by Steward Drone Pt advocate

## 2012-02-12 NOTE — ED Notes (Signed)
Pt keeps saying "dont give me a shot" iv held until ordered by edp. NAD

## 2012-02-12 NOTE — ED Provider Notes (Signed)
History   This chart was scribed for Shelda Jakes, MD, by Frederik Pear, ER scribe. The patient was seen in room [facility]14/[facility]14 and the patient's care was started at 0822.    CSN: [id number]  Arrival date & time 02/12/12  0818   First MD Initiated Contact with Patient 02/12/12 [phone]      Chief Complaint  Patient presents with  . Altered Mental Status    (Consider location/radiation/quality/duration/timing/severity/associated sxs/prior treatment) HPI  Brittney Wall is a 67 y.o. female with a h/o of schizophrenia and hypertension who presents to the Emergency Department an altered mental status with associated polyuria, coughing, SOB, and decreased appetite that began 4 days ago. Her daughter denies any associated emesis, nausea, diarrhea, chest pain, abdominal pain, fever, or rash. She states that she found her earlier today she found her mother confused and voiding in the middle of the bathroom floor. The patient is a Level 5 Caveat.  PCP is Dr. Loleta Chance.    Past Medical History  Diagnosis Date  . Hypertension   . Schizophrenia   . Dementia     Past Surgical History  Procedure Date  . Ortho procedure     No family history on file.  History  Substance Use Topics  . Smoking status: Current Every Day Smoker -- 2.0 packs/day  . Smokeless tobacco: Not on file  . Alcohol Use: No    OB History    Grav Para Term Preterm Abortions TAB SAB Ect Mult Living                  Review of Systems  Unable to perform ROS: Mental status change  Constitutional: Positive for appetite change. Negative for fever.  HENT: Negative for congestion.   Eyes: Negative for redness.  Respiratory: Positive for cough and shortness of breath.   Cardiovascular: Negative for chest pain.  Gastrointestinal: Negative for nausea, vomiting, abdominal pain and diarrhea.  Genitourinary: Positive for frequency.  Musculoskeletal: Negative for back pain.  Skin: Negative for rash.  Neurological: Negative  for headaches.  Hematological: Does not bruise/bleed easily.  Psychiatric/Behavioral: Positive for confusion.  All other systems reviewed and are negative.    Allergies  Review of patient's allergies indicates no known allergies.  Home Medications   Current Outpatient Rx  Name  Route  Sig  Dispense  Refill  . ASPIRIN EC 81 MG PO TBEC   Oral   Take 81 mg by mouth daily.           Marland Kitchen CLONIDINE HCL 0.1 MG PO TABS   Oral   Take 0.1 mg by mouth 3 (three) times daily.           Marland Kitchen HALOPERIDOL DECANOATE 100 MG/ML IM SOLN   Intramuscular   Inject 100 mg into the muscle every 28 (twenty-eight) days.         Marland Kitchen HYDRALAZINE HCL 10 MG PO TABS   Oral   Take 10 mg by mouth at bedtime.          Marland Kitchen LORAZEPAM 0.5 MG PO TABS   Oral   Take 0.5 mg by mouth every 8 (eight) hours as needed. For anixety         . MIRTAZAPINE 30 MG PO TABS   Oral   Take 30 mg by mouth at bedtime.           Marland Kitchen NITROFURANTOIN MONOHYD MACRO 100 MG PO CAPS   Oral   Take 1 capsule (100 mg total)  by mouth 2 (two) times daily.   10 capsule   0   . POTASSIUM CHLORIDE ER 10 MEQ PO TBCR   Oral   Take 1 tablet (10 mEq total) by mouth 2 (two) times daily.   14 tablet   0   . PRAVASTATIN SODIUM 20 MG PO TABS   Oral   Take 20 mg by mouth daily.         . TRAZODONE HCL 50 MG PO TABS   Oral   Take 50 mg by mouth at bedtime.             BP 168/105  Pulse 88  Temp 98.3 F (36.8 C) (Rectal)  Resp 29  Ht 5\' 7"  (1.702 m)  Wt 135 lb (61.236 kg)  BMI 21.14 kg/m2  SpO2 99%  Physical Exam  Nursing note and vitals reviewed. HENT:  Head: Normocephalic and atraumatic.  Mouth/Throat: Mucous membranes are dry.  Eyes: No scleral icterus.  Neck: Neck supple.  Cardiovascular: Normal rate and regular rhythm.   Murmur heard.      Systolic heart murmur  Pulmonary/Chest: She is in respiratory distress. She has no wheezes. She has rhonchi. She has no rales.       She has bilateral rhonchi. She purses  her lips when breathing. She is coughing during exam.  Abdominal: Soft. Bowel sounds are normal. There is no tenderness.  Musculoskeletal: She exhibits no edema.       She is drop footed on her left foot.  Psychiatric:       She appears confused and mostly keeps repeating that she does not want any shots.    ED Course  Procedures (including critical care time)  DIAGNOSTIC STUDIES: Oxygen Saturation is 95% on room air, adequate by my interpretation.    COORDINATION OF CARE:  09:31- Discussed planned course of treatment with the patient, including a catheter, chest X-ray, blood work, and IV fluids, who is agreeable at this time.  Labs Reviewed  URINALYSIS, ROUTINE W REFLEX MICROSCOPIC - Abnormal; Notable for the following:    Bilirubin Urine SMALL (*)     Ketones, ur 15 (*)     All other components within normal limits  CBC WITH DIFFERENTIAL - Abnormal; Notable for the following:    HCT 34.3 (*)     Neutrophils Relative 84 (*)     Lymphocytes Relative 8 (*)     Lymphs Abs 0.5 (*)     All other components within normal limits  BASIC METABOLIC PANEL - Abnormal; Notable for the following:    Sodium 126 (*)     Potassium 2.8 (*)     Chloride 88 (*)     Glucose, Bld 150 (*)     GFR calc non Af Amer 67 (*)     GFR calc Af Amer 77 (*)     All other components within normal limits  BASIC METABOLIC PANEL - Abnormal; Notable for the following:    Sodium 128 (*)     Potassium 2.9 (*)     Chloride 89 (*)     Glucose, Bld 169 (*)     GFR calc non Af Amer 86 (*)     All other components within normal limits  URINE CULTURE  LAB REPORT - SCANNED   No results found.  Date: 02/15/2012  Rate: 93  Rhythm: Normal Sinus Rhythm  QRS Axis: Nl  Intervals: Nl  ST/T Wave abnormalities: Nonspecific ST and T wave changes  Conduction  Disutrbances:Prolonged QT  Narrative Interpretation:   Old EKG Reviewed: Unchanged from 07/21/09  Results for orders placed during the hospital encounter of  02/12/12  URINALYSIS, ROUTINE W REFLEX MICROSCOPIC      Component Value Range   Color, Urine YELLOW  YELLOW   APPearance CLEAR  CLEAR   Specific Gravity, Urine 1.010  1.005 - 1.030   pH 6.0  5.0 - 8.0   Glucose, UA NEGATIVE  NEGATIVE mg/dL   Hgb urine dipstick NEGATIVE  NEGATIVE   Bilirubin Urine SMALL (*) NEGATIVE   Ketones, ur 15 (*) NEGATIVE mg/dL   Protein, ur NEGATIVE  NEGATIVE mg/dL   Urobilinogen, UA 0.2  0.0 - 1.0 mg/dL   Nitrite NEGATIVE  NEGATIVE   Leukocytes, UA NEGATIVE  NEGATIVE  URINE CULTURE      Component Value Range   Specimen Description URINE, CATHETERIZED     Special Requests NONE     Culture  Setup Time 02/13/2012 01:36     Colony Count NO GROWTH     Culture NO GROWTH     Report Status 02/14/2012 FINAL    CBC WITH DIFFERENTIAL      Component Value Range   WBC 6.8  4.0 - 10.5 K/uL   RBC 4.21  3.87 - 5.11 MIL/uL   Hemoglobin 12.2  12.0 - 15.0 g/dL   HCT 16.1 (*) 09.6 - 04.5 %   MCV 81.5  78.0 - 100.0 fL   MCH 29.0  26.0 - 34.0 pg   MCHC 35.6  30.0 - 36.0 g/dL   RDW 40.9  81.1 - 91.4 %   Platelets 293  150 - 400 K/uL   Neutrophils Relative 84 (*) 43 - 77 %   Neutro Abs 5.7  1.7 - 7.7 K/uL   Lymphocytes Relative 8 (*) 12 - 46 %   Lymphs Abs 0.5 (*) 0.7 - 4.0 K/uL   Monocytes Relative 8  3 - 12 %   Monocytes Absolute 0.6  0.1 - 1.0 K/uL   Eosinophils Relative 0  0 - 5 %   Eosinophils Absolute 0.0  0.0 - 0.7 K/uL   Basophils Relative 0  0 - 1 %   Basophils Absolute 0.0  0.0 - 0.1 K/uL  BASIC METABOLIC PANEL      Component Value Range   Sodium 126 (*) 135 - 145 mEq/L   Potassium 2.8 (*) 3.5 - 5.1 mEq/L   Chloride 88 (*) 96 - 112 mEq/L   CO2 22  19 - 32 mEq/L   Glucose, Bld 150 (*) 70 - 99 mg/dL   BUN 8  6 - 23 mg/dL   Creatinine, Ser 7.82  0.50 - 1.10 mg/dL   Calcium 9.1  8.4 - 95.6 mg/dL   GFR calc non Af Amer 67 (*) >90 mL/min   GFR calc Af Amer 77 (*) >90 mL/min  BASIC METABOLIC PANEL      Component Value Range   Sodium 128 (*) 135 - 145  mEq/L   Potassium 2.9 (*) 3.5 - 5.1 mEq/L   Chloride 89 (*) 96 - 112 mEq/L   CO2 27  19 - 32 mEq/L   Glucose, Bld 169 (*) 70 - 99 mg/dL   BUN 8  6 - 23 mg/dL   Creatinine, Ser 2.13  0.50 - 1.10 mg/dL   Calcium 8.9  8.4 - 08.6 mg/dL   GFR calc non Af Amer 86 (*) >90 mL/min   GFR calc Af Amer >90  >90 mL/min  Results for orders placed during the hospital encounter of 02/12/12  URINALYSIS, ROUTINE W REFLEX MICROSCOPIC      Component Value Range   Color, Urine YELLOW  YELLOW   APPearance CLEAR  CLEAR   Specific Gravity, Urine 1.010  1.005 - 1.030   pH 6.0  5.0 - 8.0   Glucose, UA NEGATIVE  NEGATIVE mg/dL   Hgb urine dipstick NEGATIVE  NEGATIVE   Bilirubin Urine SMALL (*) NEGATIVE   Ketones, ur 15 (*) NEGATIVE mg/dL   Protein, ur NEGATIVE  NEGATIVE mg/dL   Urobilinogen, UA 0.2  0.0 - 1.0 mg/dL   Nitrite NEGATIVE  NEGATIVE   Leukocytes, UA NEGATIVE  NEGATIVE  URINE CULTURE      Component Value Range   Specimen Description URINE, CATHETERIZED     Special Requests NONE     Culture  Setup Time 02/13/2012 01:36     Colony Count NO GROWTH     Culture NO GROWTH     Report Status 02/14/2012 FINAL    CBC WITH DIFFERENTIAL      Component Value Range   WBC 6.8  4.0 - 10.5 K/uL   RBC 4.21  3.87 - 5.11 MIL/uL   Hemoglobin 12.2  12.0 - 15.0 g/dL   HCT 29.5 (*) 62.1 - 30.8 %   MCV 81.5  78.0 - 100.0 fL   MCH 29.0  26.0 - 34.0 pg   MCHC 35.6  30.0 - 36.0 g/dL   RDW 65.7  84.6 - 96.2 %   Platelets 293  150 - 400 K/uL   Neutrophils Relative 84 (*) 43 - 77 %   Neutro Abs 5.7  1.7 - 7.7 K/uL   Lymphocytes Relative 8 (*) 12 - 46 %   Lymphs Abs 0.5 (*) 0.7 - 4.0 K/uL   Monocytes Relative 8  3 - 12 %   Monocytes Absolute 0.6  0.1 - 1.0 K/uL   Eosinophils Relative 0  0 - 5 %   Eosinophils Absolute 0.0  0.0 - 0.7 K/uL   Basophils Relative 0  0 - 1 %   Basophils Absolute 0.0  0.0 - 0.1 K/uL  BASIC METABOLIC PANEL      Component Value Range   Sodium 126 (*) 135 - 145 mEq/L   Potassium 2.8  (*) 3.5 - 5.1 mEq/L   Chloride 88 (*) 96 - 112 mEq/L   CO2 22  19 - 32 mEq/L   Glucose, Bld 150 (*) 70 - 99 mg/dL   BUN 8  6 - 23 mg/dL   Creatinine, Ser 9.52  0.50 - 1.10 mg/dL   Calcium 9.1  8.4 - 84.1 mg/dL   GFR calc non Af Amer 67 (*) >90 mL/min   GFR calc Af Amer 77 (*) >90 mL/min  BASIC METABOLIC PANEL      Component Value Range   Sodium 128 (*) 135 - 145 mEq/L   Potassium 2.9 (*) 3.5 - 5.1 mEq/L   Chloride 89 (*) 96 - 112 mEq/L   CO2 27  19 - 32 mEq/L   Glucose, Bld 169 (*) 70 - 99 mg/dL   BUN 8  6 - 23 mg/dL   Creatinine, Ser 3.24  0.50 - 1.10 mg/dL   Calcium 8.9  8.4 - 40.1 mg/dL   GFR calc non Af Amer 86 (*) >90 mL/min   GFR calc Af Amer >90  >90 mL/min   Dg Chest 2 View  02/12/2012  *RADIOLOGY REPORT*  Clinical Data: Altered mental  status, dementia and schizophrenia.  CHEST - 2 VIEW  Comparison: 01/10/2012  Findings: The lungs are clear without evidence of infiltrate, edema, effusion or nodule.  The heart size is stable and within normal limits.  Stable degenerative changes are present in the spine.  IMPRESSION: No active disease.   Original Report Authenticated By: Irish Lack, M.D.         1. Cough   2. Shortness of breath   3. Confusion   4. Hyponatremia       MDM  Brought in for confusion and not eating well. Work up in ED and long period of observation without specific findings other than hyponatremia which was improved with ED IV fluids NS during stay. Family okay with patient returning home.   I personally performed the services described in this documentation, which was scribed in my presence. The recorded information has been reviewed and is accurate.         Shelda Jakes, MD 02/15/12 [phone]

## 2012-02-12 NOTE — ED Notes (Signed)
Family reports pt has been voiding a lot and not eating well for the past 2 days.  Reports has had cold symptoms since Monday also.  Reports today pt is confused.

## 2012-02-12 NOTE — ED Notes (Signed)
Family requesting to give pt her home blood pressure medication, Dr. Riesa Pope notified, advised to wait until pt is re-assessed by him, pt and family updated on delay

## 2012-02-12 NOTE — ED Notes (Signed)
Family member asking about pt's blood pressure medication again, Dr. Deretha Emory notified,

## 2012-02-14 LAB — URINE CULTURE: Colony Count: NO GROWTH

## 2012-03-05 DIAGNOSIS — Z23 Encounter for immunization: Secondary | ICD-10-CM | POA: Diagnosis not present

## 2012-03-05 DIAGNOSIS — J449 Chronic obstructive pulmonary disease, unspecified: Secondary | ICD-10-CM | POA: Diagnosis not present

## 2012-03-05 DIAGNOSIS — I10 Essential (primary) hypertension: Secondary | ICD-10-CM | POA: Diagnosis not present

## 2012-03-05 DIAGNOSIS — F2089 Other schizophrenia: Secondary | ICD-10-CM | POA: Diagnosis not present

## 2012-04-02 DIAGNOSIS — F209 Schizophrenia, unspecified: Secondary | ICD-10-CM | POA: Diagnosis not present

## 2012-06-25 DIAGNOSIS — F209 Schizophrenia, unspecified: Secondary | ICD-10-CM | POA: Diagnosis not present

## 2012-09-15 DIAGNOSIS — F0390 Unspecified dementia without behavioral disturbance: Secondary | ICD-10-CM | POA: Diagnosis not present

## 2012-09-15 DIAGNOSIS — I10 Essential (primary) hypertension: Secondary | ICD-10-CM | POA: Diagnosis not present

## 2012-09-15 DIAGNOSIS — J449 Chronic obstructive pulmonary disease, unspecified: Secondary | ICD-10-CM | POA: Diagnosis not present

## 2012-09-16 DIAGNOSIS — I10 Essential (primary) hypertension: Secondary | ICD-10-CM | POA: Diagnosis not present

## 2012-09-24 DIAGNOSIS — F209 Schizophrenia, unspecified: Secondary | ICD-10-CM | POA: Diagnosis not present

## 2013-01-14 DIAGNOSIS — F209 Schizophrenia, unspecified: Secondary | ICD-10-CM | POA: Diagnosis not present

## 2013-02-19 DIAGNOSIS — J449 Chronic obstructive pulmonary disease, unspecified: Secondary | ICD-10-CM | POA: Diagnosis not present

## 2013-02-19 DIAGNOSIS — I10 Essential (primary) hypertension: Secondary | ICD-10-CM | POA: Diagnosis not present

## 2013-02-19 DIAGNOSIS — R634 Abnormal weight loss: Secondary | ICD-10-CM | POA: Diagnosis not present

## 2013-02-22 ENCOUNTER — Other Ambulatory Visit (HOSPITAL_COMMUNITY): Payer: Self-pay | Admitting: Family Medicine

## 2013-02-22 ENCOUNTER — Ambulatory Visit (HOSPITAL_COMMUNITY)
Admission: RE | Admit: 2013-02-22 | Discharge: 2013-02-22 | Disposition: A | Discharge status: Home / Self Care | Payer: Medicare Other | Source: Ambulatory Visit | Attending: Family Medicine | Admitting: Family Medicine

## 2013-02-22 DIAGNOSIS — Z87891 Personal history of nicotine dependence: Secondary | ICD-10-CM | POA: Insufficient documentation

## 2013-02-22 DIAGNOSIS — J449 Chronic obstructive pulmonary disease, unspecified: Secondary | ICD-10-CM | POA: Diagnosis not present

## 2013-02-22 DIAGNOSIS — R634 Abnormal weight loss: Secondary | ICD-10-CM | POA: Insufficient documentation

## 2013-02-22 DIAGNOSIS — F17200 Nicotine dependence, unspecified, uncomplicated: Secondary | ICD-10-CM

## 2013-02-22 DIAGNOSIS — I10 Essential (primary) hypertension: Secondary | ICD-10-CM | POA: Insufficient documentation

## 2013-02-22 DIAGNOSIS — R918 Other nonspecific abnormal finding of lung field: Secondary | ICD-10-CM | POA: Diagnosis not present

## 2013-02-22 DIAGNOSIS — F0390 Unspecified dementia without behavioral disturbance: Secondary | ICD-10-CM | POA: Insufficient documentation

## 2013-02-26 DIAGNOSIS — F2081 Schizophreniform disorder: Secondary | ICD-10-CM | POA: Diagnosis not present

## 2013-02-26 DIAGNOSIS — M6281 Muscle weakness (generalized): Secondary | ICD-10-CM | POA: Diagnosis not present

## 2013-02-26 DIAGNOSIS — R262 Difficulty in walking, not elsewhere classified: Secondary | ICD-10-CM | POA: Diagnosis not present

## 2013-02-26 DIAGNOSIS — I10 Essential (primary) hypertension: Secondary | ICD-10-CM | POA: Diagnosis not present

## 2013-02-26 DIAGNOSIS — G309 Alzheimer's disease, unspecified: Secondary | ICD-10-CM | POA: Diagnosis not present

## 2013-02-26 DIAGNOSIS — F0280 Dementia in other diseases classified elsewhere without behavioral disturbance: Secondary | ICD-10-CM | POA: Diagnosis not present

## 2013-02-26 DIAGNOSIS — J449 Chronic obstructive pulmonary disease, unspecified: Secondary | ICD-10-CM | POA: Diagnosis not present

## 2013-03-03 DIAGNOSIS — M6281 Muscle weakness (generalized): Secondary | ICD-10-CM | POA: Diagnosis not present

## 2013-03-03 DIAGNOSIS — R262 Difficulty in walking, not elsewhere classified: Secondary | ICD-10-CM | POA: Diagnosis not present

## 2013-03-03 DIAGNOSIS — G309 Alzheimer's disease, unspecified: Secondary | ICD-10-CM | POA: Diagnosis not present

## 2013-03-03 DIAGNOSIS — F2081 Schizophreniform disorder: Secondary | ICD-10-CM | POA: Diagnosis not present

## 2013-03-03 DIAGNOSIS — F0280 Dementia in other diseases classified elsewhere without behavioral disturbance: Secondary | ICD-10-CM | POA: Diagnosis not present

## 2013-03-03 DIAGNOSIS — I10 Essential (primary) hypertension: Secondary | ICD-10-CM | POA: Diagnosis not present

## 2013-03-03 DIAGNOSIS — J449 Chronic obstructive pulmonary disease, unspecified: Secondary | ICD-10-CM | POA: Diagnosis not present

## 2013-03-04 DIAGNOSIS — F0280 Dementia in other diseases classified elsewhere without behavioral disturbance: Secondary | ICD-10-CM | POA: Diagnosis not present

## 2013-03-04 DIAGNOSIS — G309 Alzheimer's disease, unspecified: Secondary | ICD-10-CM | POA: Diagnosis not present

## 2013-03-04 DIAGNOSIS — J449 Chronic obstructive pulmonary disease, unspecified: Secondary | ICD-10-CM | POA: Diagnosis not present

## 2013-03-04 DIAGNOSIS — I10 Essential (primary) hypertension: Secondary | ICD-10-CM | POA: Diagnosis not present

## 2013-03-04 DIAGNOSIS — R262 Difficulty in walking, not elsewhere classified: Secondary | ICD-10-CM | POA: Diagnosis not present

## 2013-03-04 DIAGNOSIS — F2081 Schizophreniform disorder: Secondary | ICD-10-CM | POA: Diagnosis not present

## 2013-03-04 DIAGNOSIS — M6281 Muscle weakness (generalized): Secondary | ICD-10-CM | POA: Diagnosis not present

## 2013-03-08 DIAGNOSIS — F0280 Dementia in other diseases classified elsewhere without behavioral disturbance: Secondary | ICD-10-CM | POA: Diagnosis not present

## 2013-03-08 DIAGNOSIS — M6281 Muscle weakness (generalized): Secondary | ICD-10-CM | POA: Diagnosis not present

## 2013-03-08 DIAGNOSIS — R262 Difficulty in walking, not elsewhere classified: Secondary | ICD-10-CM | POA: Diagnosis not present

## 2013-03-08 DIAGNOSIS — G309 Alzheimer's disease, unspecified: Secondary | ICD-10-CM | POA: Diagnosis not present

## 2013-03-08 DIAGNOSIS — F2081 Schizophreniform disorder: Secondary | ICD-10-CM | POA: Diagnosis not present

## 2013-03-08 DIAGNOSIS — I10 Essential (primary) hypertension: Secondary | ICD-10-CM | POA: Diagnosis not present

## 2013-03-08 DIAGNOSIS — J449 Chronic obstructive pulmonary disease, unspecified: Secondary | ICD-10-CM | POA: Diagnosis not present

## 2013-03-09 DIAGNOSIS — J449 Chronic obstructive pulmonary disease, unspecified: Secondary | ICD-10-CM | POA: Diagnosis not present

## 2013-03-09 DIAGNOSIS — M6281 Muscle weakness (generalized): Secondary | ICD-10-CM | POA: Diagnosis not present

## 2013-03-09 DIAGNOSIS — R262 Difficulty in walking, not elsewhere classified: Secondary | ICD-10-CM | POA: Diagnosis not present

## 2013-03-09 DIAGNOSIS — I10 Essential (primary) hypertension: Secondary | ICD-10-CM | POA: Diagnosis not present

## 2013-03-09 DIAGNOSIS — G309 Alzheimer's disease, unspecified: Secondary | ICD-10-CM | POA: Diagnosis not present

## 2013-03-09 DIAGNOSIS — F0280 Dementia in other diseases classified elsewhere without behavioral disturbance: Secondary | ICD-10-CM | POA: Diagnosis not present

## 2013-03-09 DIAGNOSIS — F2081 Schizophreniform disorder: Secondary | ICD-10-CM | POA: Diagnosis not present

## 2013-03-10 DIAGNOSIS — I10 Essential (primary) hypertension: Secondary | ICD-10-CM | POA: Diagnosis not present

## 2013-03-10 DIAGNOSIS — M6281 Muscle weakness (generalized): Secondary | ICD-10-CM | POA: Diagnosis not present

## 2013-03-10 DIAGNOSIS — F2081 Schizophreniform disorder: Secondary | ICD-10-CM | POA: Diagnosis not present

## 2013-03-10 DIAGNOSIS — F0280 Dementia in other diseases classified elsewhere without behavioral disturbance: Secondary | ICD-10-CM | POA: Diagnosis not present

## 2013-03-10 DIAGNOSIS — G309 Alzheimer's disease, unspecified: Secondary | ICD-10-CM | POA: Diagnosis not present

## 2013-03-10 DIAGNOSIS — J449 Chronic obstructive pulmonary disease, unspecified: Secondary | ICD-10-CM | POA: Diagnosis not present

## 2013-03-10 DIAGNOSIS — R262 Difficulty in walking, not elsewhere classified: Secondary | ICD-10-CM | POA: Diagnosis not present

## 2013-03-12 ENCOUNTER — Emergency Department (HOSPITAL_COMMUNITY)
Admission: EM | Admit: 2013-03-12 | Discharge: 2013-03-12 | Disposition: A | Discharge status: Home / Self Care | Payer: Medicare Other | Attending: Emergency Medicine | Admitting: Emergency Medicine

## 2013-03-12 ENCOUNTER — Encounter (HOSPITAL_COMMUNITY): Payer: Self-pay | Admitting: Emergency Medicine

## 2013-03-12 ENCOUNTER — Emergency Department (HOSPITAL_COMMUNITY): Payer: Medicare Other

## 2013-03-12 DIAGNOSIS — Y92009 Unspecified place in unspecified non-institutional (private) residence as the place of occurrence of the external cause: Secondary | ICD-10-CM

## 2013-03-12 DIAGNOSIS — F209 Schizophrenia, unspecified: Secondary | ICD-10-CM | POA: Diagnosis not present

## 2013-03-12 DIAGNOSIS — F17200 Nicotine dependence, unspecified, uncomplicated: Secondary | ICD-10-CM | POA: Insufficient documentation

## 2013-03-12 DIAGNOSIS — F0390 Unspecified dementia without behavioral disturbance: Secondary | ICD-10-CM | POA: Insufficient documentation

## 2013-03-12 DIAGNOSIS — R296 Repeated falls: Secondary | ICD-10-CM | POA: Insufficient documentation

## 2013-03-12 DIAGNOSIS — S3981XA Other specified injuries of abdomen, initial encounter: Secondary | ICD-10-CM | POA: Diagnosis not present

## 2013-03-12 DIAGNOSIS — W19XXXA Unspecified fall, initial encounter: Secondary | ICD-10-CM

## 2013-03-12 DIAGNOSIS — S298XXA Other specified injuries of thorax, initial encounter: Secondary | ICD-10-CM | POA: Diagnosis not present

## 2013-03-12 DIAGNOSIS — R079 Chest pain, unspecified: Secondary | ICD-10-CM | POA: Diagnosis not present

## 2013-03-12 DIAGNOSIS — M25559 Pain in unspecified hip: Secondary | ICD-10-CM | POA: Diagnosis not present

## 2013-03-12 DIAGNOSIS — R10A1 Flank pain, right side: Secondary | ICD-10-CM

## 2013-03-12 DIAGNOSIS — S79919A Unspecified injury of unspecified hip, initial encounter: Secondary | ICD-10-CM | POA: Diagnosis not present

## 2013-03-12 DIAGNOSIS — Z79899 Other long term (current) drug therapy: Secondary | ICD-10-CM | POA: Diagnosis not present

## 2013-03-12 DIAGNOSIS — I10 Essential (primary) hypertension: Secondary | ICD-10-CM | POA: Diagnosis not present

## 2013-03-12 DIAGNOSIS — R1011 Right upper quadrant pain: Secondary | ICD-10-CM | POA: Diagnosis not present

## 2013-03-12 DIAGNOSIS — R109 Unspecified abdominal pain: Secondary | ICD-10-CM | POA: Diagnosis not present

## 2013-03-12 DIAGNOSIS — IMO0002 Reserved for concepts with insufficient information to code with codable children: Secondary | ICD-10-CM | POA: Diagnosis not present

## 2013-03-12 DIAGNOSIS — Y939 Activity, unspecified: Secondary | ICD-10-CM | POA: Insufficient documentation

## 2013-03-12 LAB — CBC WITH DIFFERENTIAL/PLATELET
BASOS PCT: 0 % (ref 0–1)
Basophils Absolute: 0 10*3/uL (ref 0.0–0.1)
EOS PCT: 1 % (ref 0–5)
Eosinophils Absolute: 0.1 10*3/uL (ref 0.0–0.7)
HCT: 38.4 % (ref 36.0–46.0)
HEMOGLOBIN: 12.9 g/dL (ref 12.0–15.0)
Lymphocytes Relative: 14 % (ref 12–46)
Lymphs Abs: 1 10*3/uL (ref 0.7–4.0)
MCH: 30 pg (ref 26.0–34.0)
MCHC: 33.6 g/dL (ref 30.0–36.0)
MCV: 89.3 fL (ref 78.0–100.0)
MONO ABS: 0.6 10*3/uL (ref 0.1–1.0)
MONOS PCT: 8 % (ref 3–12)
Neutro Abs: 5.7 10*3/uL (ref 1.7–7.7)
Neutrophils Relative %: 77 % (ref 43–77)
Platelets: 189 10*3/uL (ref 150–400)
RBC: 4.3 MIL/uL (ref 3.87–5.11)
RDW: 13.2 % (ref 11.5–15.5)
WBC: 7.4 10*3/uL (ref 4.0–10.5)

## 2013-03-12 LAB — URINALYSIS W MICROSCOPIC + REFLEX CULTURE
BILIRUBIN URINE: NEGATIVE
GLUCOSE, UA: NEGATIVE mg/dL
Hgb urine dipstick: NEGATIVE
Ketones, ur: NEGATIVE mg/dL
Leukocytes, UA: NEGATIVE
Nitrite: NEGATIVE
PH: 5.5 (ref 5.0–8.0)
PROTEIN: NEGATIVE mg/dL
Specific Gravity, Urine: >1.03 — ABNORMAL HIGH (ref 1.005–1.030)
Urobilinogen, UA: 0.2 mg/dL (ref 0.0–1.0)

## 2013-03-12 LAB — TROPONIN I: Troponin I: <0.3 ng/mL (ref <0.30)

## 2013-03-12 LAB — COMPREHENSIVE METABOLIC PANEL
ALBUMIN: 3.5 g/dL (ref 3.5–5.2)
ALT: 20 U/L (ref 0–35)
AST: 23 U/L (ref 0–37)
Alkaline Phosphatase: 75 U/L (ref 39–117)
BUN: 20 mg/dL (ref 6–23)
CALCIUM: 9.5 mg/dL (ref 8.4–10.5)
CHLORIDE: 107 meq/L (ref 96–112)
CO2: 26 mEq/L (ref 19–32)
CREATININE: 0.73 mg/dL (ref 0.50–1.10)
GFR calc Af Amer: >90 mL/min (ref >90)
GFR calc non Af Amer: 87 mL/min — ABNORMAL LOW (ref >90)
Glucose, Bld: 100 mg/dL — ABNORMAL HIGH (ref 70–99)
Potassium: 4.4 mEq/L (ref 3.7–5.3)
Sodium: 144 mEq/L (ref 137–147)
Total Bilirubin: 0.3 mg/dL (ref 0.3–1.2)
Total Protein: 7.4 g/dL (ref 6.0–8.3)

## 2013-03-12 LAB — LIPASE, BLOOD: LIPASE: 48 U/L (ref 11–59)

## 2013-03-12 LAB — LACTIC ACID, PLASMA: LACTIC ACID, VENOUS: 1 mmol/L (ref 0.5–2.2)

## 2013-03-12 MED ORDER — MORPHINE SULFATE 2 MG/ML IJ SOLN: 1.0000 mg | INTRAMUSCULAR | Status: DC | PRN

## 2013-03-12 MED ORDER — IOHEXOL 300 MG/ML  SOLN
100.0000 mL | Freq: Once | INTRAMUSCULAR | Status: AC | PRN
  Administered 2013-03-12: 100 mL via INTRAVENOUS

## 2013-03-12 MED ORDER — SODIUM CHLORIDE 0.9 % IV SOLN
INTRAVENOUS | Status: DC
  Administered 2013-03-12: 15:00:00 via INTRAVENOUS

## 2013-03-12 MED ORDER — LORAZEPAM 2 MG/ML IJ SOLN
0.5000 mg | Freq: Once | INTRAMUSCULAR | Status: AC
  Administered 2013-03-12: 0.5 mg via INTRAMUSCULAR
  Filled 2013-03-12: qty 1

## 2013-03-12 NOTE — ED Provider Notes (Signed)
CSN: [id number]     Arrival date & time 03/12/13  1228 History   First MD Initiated Contact with Patient 03/12/13 1308     Chief Complaint  Patient presents with  . Flank Pain    Patient is a 68 y.o. female presenting with flank pain. The history is provided by a caregiver. The history is limited by the condition of the patient (Hx dementia).  Flank Pain  Pt was seen at 1315.  Per pt's caregiver, pt with persistent c/o right sided "pain" since last night. Pt's family states she "got tired last night and fell backwards," which is not unusual for the pt. Pt has been putting her hand on her right abd and flank areas saying she "hurts." Pt's family also states pt has had a moist cough for the past several days. Denies fevers, no LOC/syncope, no AMS from baseline dementia, no vomiting/diarrhea, no rash. Pt has significant hx of dementia and schizophrenia.   Past Medical History  Diagnosis Date  . Hypertension   . Schizophrenia   . Dementia    Past Surgical History  Procedure Laterality Date  . Ortho procedure      History  Substance Use Topics  . Smoking status: Current Every Day Smoker -- 2.00 packs/day  . Smokeless tobacco: Not on file  . Alcohol Use: No    Review of Systems  Unable to perform ROS: Dementia  Genitourinary: Positive for flank pain.    Allergies  Review of patient's allergies indicates no known allergies.  Home Medications   Current Outpatient Rx  Name  Route  Sig  Dispense  Refill  . cloNIDine (CATAPRES) 0.1 MG tablet   Oral   Take 0.1 mg by mouth 3 (three) times daily.           . haloperidol decanoate (HALDOL DECANOATE) 100 MG/ML injection   Intramuscular   Inject 100 mg into the muscle every 28 (twenty-eight) days.         . hydrALAZINE (APRESOLINE) 10 MG tablet   Oral   Take 10 mg by mouth at bedtime.          Marland Kitchen LORazepam (ATIVAN) 0.5 MG tablet   Oral   Take 0.5 mg by mouth daily. For anixety         . mirtazapine (REMERON) 30 MG  tablet   Oral   Take 30 mg by mouth at bedtime.           . potassium chloride (K-DUR) 10 MEQ tablet   Oral   Take 1 tablet (10 mEq total) by mouth 2 (two) times daily.   14 tablet   0   . pravastatin (PRAVACHOL) 20 MG tablet   Oral   Take 20 mg by mouth daily.         . traZODone (DESYREL) 50 MG tablet   Oral   Take 50 mg by mouth at bedtime.            BP 161/73  Pulse 87  Temp(Src) 98.3 F (36.8 C) (Oral)  Resp 18  Ht 5\' 4"  (1.626 m)  Wt 100 lb (45.36 kg)  BMI 17.16 kg/m2  SpO2 97% Physical Exam 1320: Physical examination:  Nursing notes reviewed; Vital signs and O2 SAT reviewed;  Constitutional: Thin, In no acute distress; Head:  Normocephalic, atraumatic; Eyes: EOMI, PERRL, No scleral icterus; ENMT: Mouth and pharynx normal, Mucous membranes dry; Neck: Supple, Full range of motion, No lymphadenopathy; Cardiovascular: Regular rate and rhythm, No gallop; Respiratory:  Breath sounds coarse & equal bilaterally, No wheezes. Moist cough during exam. Normal respiratory effort/excursion; Chest: +TTP right lower posterior-lateral ribs. No deformity, no rash. No abrasions or ecchymosis. Movement normal; Abdomen: Soft, +RUQ and RLQ tenderness to palp. No abrasions or ecchymosis. Nondistended, Normal bowel sounds; Genitourinary: No CVA tenderness; Spine:  No midline CS, TS, LS tenderness.;; Extremities: Pulses normal, Pelvis stable. +right hip tenderness no palp. No deformity, no ecchymosis, no abrasions. No edema, No calf edema or asymmetry.; Neuro: Awake, alert, confused re: time, place, events per hx dementia. Speaks in few words per her baseline.  Moves all extremities on stretcher spontaneously without apparent gross focal motor deficits.; Skin: Color normal, Warm, Dry.; Psych:  Affect flat, poor eye contact, easily agitated and hits staff and family.     ED Course  Procedures   1884:  Pt is now agitated, combative, swinging and punching ED staff. Will not calm with family's  encouragement. Will dose ativan as workup progresses.      EKG Interpretation    Date/Time:  Friday March 12 2013 13:45:44 EST Ventricular Rate:  69 PR Interval:  122 QRS Duration: 92 QT Interval:  410 QTC Calculation: 439 R Axis:   64 Text Interpretation:  Normal sinus rhythm Possible Left atrial enlargement Left ventricular hypertrophy T wave abnormality Anterolateral leads Abnormal ECG When compared with ECG of 12-Feb-2012 08:28, Nonspecific T wave abnormality now evident in Inferior leads T wave inversion now evident in Lateral leads QT has shortened Confirmed by [facility]  MD, Nunzio Cory [id number]) on 03/12/2013 2:01:57 PM           EKG today is similar to EKG dated 02/08/2008 (TWI anterior-lateral leads, NS TWA inferior leads).     MDM  MDM Reviewed: previous chart, nursing note and vitals Reviewed previous: labs and ECG Interpretation: labs, ECG, x-ray and CT scan     Results for orders placed during the hospital encounter of 03/12/13  URINALYSIS W MICROSCOPIC + REFLEX CULTURE      Result Value Range   Color, Urine YELLOW  YELLOW   APPearance CLEAR  CLEAR   Specific Gravity, Urine >1.030 (*) 1.005 - 1.030   pH 5.5  5.0 - 8.0   Glucose, UA NEGATIVE  NEGATIVE mg/dL   Hgb urine dipstick NEGATIVE  NEGATIVE   Bilirubin Urine NEGATIVE  NEGATIVE   Ketones, ur NEGATIVE  NEGATIVE mg/dL   Protein, ur NEGATIVE  NEGATIVE mg/dL   Urobilinogen, UA 0.2  0.0 - 1.0 mg/dL   Nitrite NEGATIVE  NEGATIVE   Leukocytes, UA NEGATIVE  NEGATIVE  CBC WITH DIFFERENTIAL      Result Value Range   WBC 7.4  4.0 - 10.5 K/uL   RBC 4.30  3.87 - 5.11 MIL/uL   Hemoglobin 12.9  12.0 - 15.0 g/dL   HCT 38.4  36.0 - 46.0 %   MCV 89.3  78.0 - 100.0 fL   MCH 30.0  26.0 - 34.0 pg   MCHC 33.6  30.0 - 36.0 g/dL   RDW 13.2  11.5 - 15.5 %   Platelets 189  150 - 400 K/uL   Neutrophils Relative % 77  43 - 77 %   Neutro Abs 5.7  1.7 - 7.7 K/uL   Lymphocytes Relative 14  12 - 46 %   Lymphs Abs 1.0  0.7 -  4.0 K/uL   Monocytes Relative 8  3 - 12 %   Monocytes Absolute 0.6  0.1 - 1.0 K/uL   Eosinophils Relative 1  0 - 5 %   Eosinophils Absolute 0.1  0.0 - 0.7 K/uL   Basophils Relative 0  0 - 1 %   Basophils Absolute 0.0  0.0 - 0.1 K/uL  COMPREHENSIVE METABOLIC PANEL      Result Value Range   Sodium 144  137 - 147 mEq/L   Potassium 4.4  3.7 - 5.3 mEq/L   Chloride 107  96 - 112 mEq/L   CO2 26  19 - 32 mEq/L   Glucose, Bld 100 (*) 70 - 99 mg/dL   BUN 20  6 - 23 mg/dL   Creatinine, Ser 0.73  0.50 - 1.10 mg/dL   Calcium 9.5  8.4 - 10.5 mg/dL   Total Protein 7.4  6.0 - 8.3 g/dL   Albumin 3.5  3.5 - 5.2 g/dL   AST 23  0 - 37 U/L   ALT 20  0 - 35 U/L   Alkaline Phosphatase 75  39 - 117 U/L   Total Bilirubin 0.3  0.3 - 1.2 mg/dL   GFR calc non Af Amer 87 (*) >90 mL/min   GFR calc Af Amer >90  >90 mL/min  LIPASE, BLOOD      Result Value Range   Lipase 48  11 - 59 U/L  LACTIC ACID, PLASMA      Result Value Range   Lactic Acid, Venous 1.0  0.5 - 2.2 mmol/L  TROPONIN I      Result Value Range   Troponin I <0.30  <0.30 ng/mL   Dg Chest 2 View 02/22/2013   CLINICAL DATA:  Unexplained weight loss. History of hypertension and dementia and smoking  EXAM: CHEST  2 VIEW  COMPARISON:  Chest x-ray dated February 12, 2012  FINDINGS: The lungs are hyperinflated with hemidiaphragm flattening. There is no focal infiltrate. The cardiac silhouette is normal in size. The mediastinum is normal in width. The pulmonary vascularity is not engorged. There is no pleural effusion. The observed portions of the bony thorax appear normal.  IMPRESSION: There is no evidence of pneumonia nor CHF. No pulmonary parenchymal masses are demonstrated. There is hyperinflation consistent with underlying COPD or reactive airway disease.   Electronically Signed   By: David  Martinique   On: 02/22/2013 13:14   Dg Ribs Unilateral W/chest Right 03/12/2013   CLINICAL DATA:  Fall, pain  EXAM: RIGHT RIBS AND CHEST - 3+ VIEW  COMPARISON:  None.   FINDINGS: No fracture or other bone lesions are seen involving the ribs. There is no evidence of pneumothorax or pleural effusion. Both lungs are clear. Heart size and mediastinal contours are within normal limits.  IMPRESSION: Negative.   Electronically Signed   By: Kathreen Devoid   On: 03/12/2013 15:56   Dg Hip Complete Right 03/12/2013   CLINICAL DATA:  Fall, pain, combative  EXAM: RIGHT HIP - COMPLETE 2+ VIEW  COMPARISON:  None.  FINDINGS: There is no evidence of hip fracture or dislocation. There is no evidence of arthropathy or other focal bone abnormality. There is contrast present within the bladder from recent CT of the abdomen/pelvis. There is peripheral vascular atherosclerotic disease.  IMPRESSION: No acute osseous injury of the right hip.   Electronically Signed   By: Kathreen Devoid   On: 03/12/2013 15:56   Ct Abdomen Pelvis W Contrast 03/12/2013   CLINICAL DATA:  Left flank pain after a fall yesterday.  EXAM: CT ABDOMEN AND PELVIS WITH CONTRAST  TECHNIQUE: Multidetector CT imaging of the abdomen and pelvis was  performed using the standard protocol following bolus administration of intravenous contrast.  CONTRAST:  148mL OMNIPAQUE IOHEXOL 300 MG/ML IV. Oral contrast was not administered.  COMPARISON:  None.  FINDINGS: No evidence of acute traumatic injury to the abdominal or pelvic visceral. Mild diffuse hepatic steatosis without focal hepatic parenchymal abnormality. Normal spleen, pancreas, right adrenal gland, and right kidney. Enlargement of the left adrenal gland without discrete nodularity. Three low-attenuation lesions arising from the right kidney, 1 of which in the anterior mid kidney represents a simple cyst, the other 2 are indeterminate by CT. Extensive aorto iliofemoral atherosclerosis without aneurysm. Possible significant stenosis in the left common iliac artery. No significant lymphadenopathy.  Food and fluid within normal-appearing stomach. Normal-appearing small bowel. Large stool  burden in the rectum and sigmoid colon; colon otherwise unremarkable. No free intraperitoneal fluid or blood. No retroperitoneal hematoma.  Urinary bladder unremarkable. Approximate 3.4 x 2.6 cm enhancing fibroid arising from the right lateral uterine body. Approximate 0.8 cm enhancing fibroid arising from the fundus. No adnexal masses or free pelvic fluid. Phleboliths low in both sides of the pelvis.  Bone window images demonstrate no fractures and mild lower thoracic and lumbar spondylosis and facet degenerative changes at L4-5 and L5-S1. Visualized lung bases clear. Heart size normal with evidence of left ventricular hypertrophy.  IMPRESSION: 1. No acute abnormalities involving the abdomen or pelvis. 2. Mild diffuse hepatic steatosis without focal hepatic parenchymal abnormality. 3. Left adrenal hyperplasia. 4. 3 left renal masses, 1 of which represents a simple cyst, the other of which are too small to characterize accurately by CT, but appear potentially solid. Consensus criteria would suggest that non-emergent MRI without and with contrast is indicated in followup. This recommendation follows ACR consensus guidelines: Managing Incidental Findings on Abdominal CT: White Paper of the ACR Incidental Findings Committee. J Am Coll Radiol 2010;7:754-773 5. Large stool burden in the rectum and sigmoid colon. 6. Uterine fibroids. 7. Normal heart size with evidence of left ventricular hypertrophy.   Electronically Signed   By: Evangeline Dakin M.D.   On: 03/12/2013 16:00    1625:  Pt now calm/cooperative, resps easy, talking with ED staff and family. Pt states she "wants to go home now," "can I go home now?"  Workup reassuring. EKG unchanged from EKG dated 02/08/2008, troponin negative after 2 days of right sided flank pain, and pt specifically denies chest pain: doubt acute cardiac event at this time. Family would like to take pt home now.  Dx and testing d/w pt and family.  Questions answered.  Verb understanding,  agreeable to d/c home with outpt f/u.   Alfonzo Feller, DO 03/15/13 1919

## 2013-03-12 NOTE — ED Notes (Signed)
Daughter states pt walks all the time. Said she got tired last night and fell. Per daughter pt was complain of pain in left flank area. Pt uncooperative at present.

## 2013-03-12 NOTE — ED Notes (Addendum)
Pain lt flank, onset today. Pts daughter says she fell last night,  Non verbal at present, Daughter say she talks when she wants to.

## 2013-03-12 NOTE — Discharge Instructions (Signed)
°Emergency Department Resource Guide °1) Find a Doctor and Pay Out of Pocket °Although you won't have to find out who is covered by your insurance plan, it is a good idea to ask around and get recommendations. You will then need to call the office and see if the doctor you have chosen will accept you as a new patient and what types of options they offer for patients who are self-pay. Some doctors offer discounts or will set up payment plans for their patients who do not have insurance, but you will need to ask so you aren't surprised when you get to your appointment. ° °2) Contact Your Local Health Department °Not all health departments have doctors that can see patients for sick visits, but many do, so it is worth a call to see if yours does. If you don't know where your local health department is, you can check in your phone book. The CDC also has a tool to help you locate your state's health department, and many state websites also have listings of all of their local health departments. ° °3) Find a Walk-in Clinic °If your illness is not likely to be very severe or complicated, you may want to try a walk in clinic. These are popping up all over the country in pharmacies, drugstores, and shopping centers. They're usually staffed by nurse practitioners or physician assistants that have been trained to treat common illnesses and complaints. They're usually fairly quick and inexpensive. However, if you have serious medical issues or chronic medical problems, these are probably not your best option. ° °No Primary Care Doctor: °- Call [facility] at  832-8000 - they can help you locate a primary care doctor that  accepts your insurance, provides certain services, etc. °- Physician Referral Service- 1-800-533-3463 ° °Chronic Pain Problems: °Organization         Address  Phone   Notes  °[facility]  (336) 297-2271 Patients need to be referred by their primary care doctor.  ° °Medication  Assistance: °Organization         Address  Phone   Notes  °[place] [facility] [address] °[place], [place] [address] (336) 641-8030 --Must be a resident of [place] °-- Must have NO insurance coverage whatsoever (no Medicaid/ Medicare, etc.) °-- The pt. MUST have a primary care doctor that directs their care regularly and follows them in the community °  °[facility]  (866) 331-1348   °[employer]  (888) 892-1162   ° °Agencies that provide inexpensive medical care: °Organization         Address  Phone   Notes  °[facility]  (336) 832-8035   °[facility]    (336) 832-7272   °[facility] [address] °[place], [place] [address] (336) 832-4777   °[facility] [address], °[place] (336) 271-4999   °[facility]    (336) 373-0678   °[facility]    (336) 272-1050   °[facility] ° [address], [place] Phone:  (336) 832-4444, Fax:  (336) 832-4440 Hours of Operation:  9 am - 6 pm, M-F.  Also accepts Medicaid/Medicare and self-pay.  °[facility] ° [address], [place] Phone: (336) 832-3150, Fax: (336) [phone]. Hours of Operation:  8:30 am - 5:30 pm, M-F.  Also accepts Medicaid and self-pay.  °[facility] [place] [address], [place] Phone: (336) 878-6027   °[facility] [address], [place], [place] (336)723-1848, Ext. 123 Mondays & Thursdays: 7-9 AM.  First 15 patients are seen on a first come, first serve basis. °  ° °Medicaid-accepting [place] Providers: ° °Organization         Address  Phone   Notes  °[facility] [address], [place] (336) 641-2100 Also accepts self-pay patients.  °[facility] [address], [place] ° (336) 856-9996   °[facility] [address] [place] [address], [place]  (336) 288-8857   °[facility] [address] [place] [address], [place] (336) 299-7000   °Veita Bland [address], [place]  ° (336) 373-1557 Only accepts [place] Access Medicaid patients after they have their name applied to their card.  ° °Self-Pay (no insurance) in [place]: ° °Organization         Address  Phone   Notes  °Sickle Cell Patients, [facility] [address], [place] (336) 832-1970   °[facility] [address], [place] (336) 832-4400   °[facility] ° [address], [place] (336) 992-4800   °[facility]/Dr. Osei-Bonsu ° [address] [place] [address], [place] or [address], [place] (336) [phone] Phone number for both [place] and [place] locations is the same.  °[facility] [address], [place] (336) 299-0000   °[facility] [address] [place] [address], [place] or [address] (336) 852-7530 °(336) 878-2260   °[facility] [address], [place] (336) 350-1642, phone; (336) 294-5005, fax Sees patients 1st and 3rd Saturday of every month.  Must not qualify for public or private insurance (i.e. Medicaid, Medicare, [place] Health Choice, Veterans' Benefits) • Household income should be no more than 200% of the poverty level •The clinic cannot treat you if you are pregnant or think you are pregnant • Sexually transmitted diseases are not treated at the clinic.  ° ° °Dental Care: °Organization         Address  Phone  Notes  °[place] [facility] [address], [place] (336) 641-6152 Accepts children up to age 21 who are enrolled in Medicaid or [place] Health Choice; pregnant women with a Medicaid card; and children who have applied for Medicaid or [place] Health Choice, but were declined, whose parents can pay a reduced fee at time of service.  °[place]  [facility] [place]  [address] [place] [address], [place] (336) 641-7733 Accepts children up to age 21 who are enrolled in Medicaid or [place] Health Choice; pregnant women with a Medicaid card; and children who have applied for Medicaid or [place] Health Choice, but were declined, whose parents can pay a reduced fee at time of service.  °[facility] ° [address], [place] (336) 641-4533 Patients are seen by appointment only. Walk-ins are not accepted. [facility] will see patients 18 years of age and older. °Monday - Tuesday (8am-5pm) °Most Wednesdays (8:30-5pm) °$30 per visit, cash only  °[facility] ° [address] [place] [address], [place] (336) 641-4533 Patients are seen by appointment only. Walk-ins are not accepted. [facility] will see patients 18 years of age and older. °One   Wednesday Evening (Monthly: Volunteer Based).  $30 per visit, cash only  °[facility]  (919) 537-3737 for adults; Children under age 4, call Graduate Pediatric Dentistry at ([phone]) [phone]. Children aged 4-14, please call (919) [phone] to request a pediatric application. ° Dental services are provided in all areas of dental care including fillings, crowns and bridges, complete and partial dentures, implants, gum treatment, root canals, and extractions. Preventive care is also provided. Treatment is provided to both adults and children. °Patients are selected via a lottery and there is often a waiting list. °  °[facility] [address], °[place] ° (336) 763-8833 [email or web address].drcivils.[email or web address] °  °[facility] [address], [place], [place] (336)723-1848, Ext. 123 Second and Fourth Thursday of each month, opens at 6:30 AM; Clinic ends at 9 AM.  Patients are seen on a first-come first-served basis, and a limited number are seen during each clinic.  ° °[facility] ° [address], [place], [place] (336) 723-7904    Eligibility Requirements °You must have lived in [place], [place], or [place] counties for at least the last three months. °  You cannot be eligible for state or federal sponsored healthcare insurance, including Veterans Administration, Medicaid, or Medicare. °  You generally cannot be eligible for healthcare insurance through your employer.  °  How to apply: °Eligibility screenings are held every Tuesday and Wednesday afternoon from 1:00 pm until 4:00 pm. You do not need an appointment for the interview!  °[facility] [address], [place], [place] 336-631-2330   °[facility]  336-342-8273   °[facility]  336-703-3100   °[facility]  336-570-6415   ° °Behavioral Health Resources in the Community: °Intensive Outpatient Programs °Organization         Address  Phone  Notes  °[facility] [address], [place], [place] 336-878-6098   °[facility] [address], [place], [place] 336-832-9800   °[facility] [address], [place], [place] ° 336-882-2125   °[facility] [address],  °[place], [place] 1-800-853-5163 or 336-641-4981   °Substance Abuse Resources °Organization         Address  Phone  Notes  °[facility]  336-882-2125   °[facility]  336-784-9470   °[facility]  336-285-9073   °[facility]  336-845-3988   °Residential & Outpatient Substance Abuse Program  1-800-659-3381   °Psychological Services °Organization         Address  Phone  Notes  °[facility]  336- 832-9600   °[facility]  336- 378-7881   °[facility] [address], [place] 1-800-853-5163 or 336-641-4981   ° °Mobile Crisis Teams °Organization         Address  Phone  Notes  °[facility], Mobile Crisis Care Unit  1-877-626-1772   °Assertive °[facility] ° [address]  [place], [place] 336-834-9664   °Sharon DeEsch [address] °[place] 336-554-5454   ° °Self-Help/Support Groups °Organization         Address  Phone             Notes  °[facility] - variety of support groups  336- 373-1402 Call for more information  °Narcotics Anonymous (NA), [facility] [address], °[place]   2 meetings at this location  ° °  Residential Treatment Programs Organization         Address  Phone  Notes  [facility] [address]    [place]  1-7826689589   [facility]  [address], [place] [address], [place], [place]   [facility], [place] (640)419-0415 Admissions: 8am-3pm M-F  [facility] [place] [address]    [place], [place] 562-130-8657   [facility] [address] [place], [place], [place]   [facility] [address],  [place], [place]   [facility] - Intensive Outpatient [address], [place], [place]   [facility] [address]  [place], [place] 1-662-483-8361 or 5802641373   [facility] [address], [place], [place] Accepts Medicaid  [facility] [address]  [place] 1-256-529-6006 Substance Abuse/Addiction Treatment   [facility]         Address  Phone  Notes  [facility]  5198760395   Domenic Schwab, PhD [address] Arlis Porta [place], [place]   743-248-2917 or 334-812-1591   [place], [place] [phone]   [facility] [address] [place], [place] 414-350-2935 Insurance/Medicaid/sponsorship through [facility] [address]                                    [place], [place] (743)529-6811 [place] [address] [place], [place] (615)[phone]    Dr. Adele Schilder  424-854-1622   [facility] Dept. 1) [address] [place] 2) [place] 3)  [address], [place] [phone] [phone]  986-851-5834   [place] 314-022-4772 or [phone] (After Hours)       Take your usual prescriptions as previously directed. Take over the counter tylenol, as directed on packaging, as needed for discomfort. Apply moist heat or ice to the area(s) of discomfort, for 15 minutes at a time, several times per day for the next few days.  Do not fall asleep on a heating or ice pack.  Call your regular medical doctor today to schedule a follow up appointment in the next 3 days.  Return to the Emergency Department immediately if worsening.

## 2013-03-17 DIAGNOSIS — J449 Chronic obstructive pulmonary disease, unspecified: Secondary | ICD-10-CM | POA: Diagnosis not present

## 2013-03-17 DIAGNOSIS — F0280 Dementia in other diseases classified elsewhere without behavioral disturbance: Secondary | ICD-10-CM | POA: Diagnosis not present

## 2013-03-17 DIAGNOSIS — R262 Difficulty in walking, not elsewhere classified: Secondary | ICD-10-CM | POA: Diagnosis not present

## 2013-03-17 DIAGNOSIS — I10 Essential (primary) hypertension: Secondary | ICD-10-CM | POA: Diagnosis not present

## 2013-03-17 DIAGNOSIS — M6281 Muscle weakness (generalized): Secondary | ICD-10-CM | POA: Diagnosis not present

## 2013-03-17 DIAGNOSIS — F2081 Schizophreniform disorder: Secondary | ICD-10-CM | POA: Diagnosis not present

## 2013-03-19 DIAGNOSIS — I10 Essential (primary) hypertension: Secondary | ICD-10-CM | POA: Diagnosis not present

## 2013-03-19 DIAGNOSIS — M6281 Muscle weakness (generalized): Secondary | ICD-10-CM | POA: Diagnosis not present

## 2013-03-19 DIAGNOSIS — R262 Difficulty in walking, not elsewhere classified: Secondary | ICD-10-CM | POA: Diagnosis not present

## 2013-03-19 DIAGNOSIS — J449 Chronic obstructive pulmonary disease, unspecified: Secondary | ICD-10-CM | POA: Diagnosis not present

## 2013-03-19 DIAGNOSIS — G309 Alzheimer's disease, unspecified: Secondary | ICD-10-CM | POA: Diagnosis not present

## 2013-03-19 DIAGNOSIS — F2081 Schizophreniform disorder: Secondary | ICD-10-CM | POA: Diagnosis not present

## 2013-03-19 DIAGNOSIS — F0280 Dementia in other diseases classified elsewhere without behavioral disturbance: Secondary | ICD-10-CM | POA: Diagnosis not present

## 2013-03-24 DIAGNOSIS — I10 Essential (primary) hypertension: Secondary | ICD-10-CM | POA: Diagnosis not present

## 2013-03-24 DIAGNOSIS — J449 Chronic obstructive pulmonary disease, unspecified: Secondary | ICD-10-CM | POA: Diagnosis not present

## 2013-03-24 DIAGNOSIS — F2081 Schizophreniform disorder: Secondary | ICD-10-CM | POA: Diagnosis not present

## 2013-03-24 DIAGNOSIS — R262 Difficulty in walking, not elsewhere classified: Secondary | ICD-10-CM | POA: Diagnosis not present

## 2013-03-24 DIAGNOSIS — M6281 Muscle weakness (generalized): Secondary | ICD-10-CM | POA: Diagnosis not present

## 2013-03-24 DIAGNOSIS — G309 Alzheimer's disease, unspecified: Secondary | ICD-10-CM | POA: Diagnosis not present

## 2013-03-24 DIAGNOSIS — F0280 Dementia in other diseases classified elsewhere without behavioral disturbance: Secondary | ICD-10-CM | POA: Diagnosis not present

## 2013-04-04 ENCOUNTER — Emergency Department (HOSPITAL_COMMUNITY)
Admission: EM | Admit: 2013-04-04 | Discharge: 2013-04-05 | Disposition: A | Discharge status: Home / Self Care | Payer: Medicare Other | Attending: Emergency Medicine | Admitting: Emergency Medicine

## 2013-04-04 ENCOUNTER — Emergency Department (HOSPITAL_COMMUNITY): Payer: Medicare Other

## 2013-04-04 ENCOUNTER — Encounter (HOSPITAL_COMMUNITY): Payer: Self-pay | Admitting: Emergency Medicine

## 2013-04-04 DIAGNOSIS — F209 Schizophrenia, unspecified: Secondary | ICD-10-CM | POA: Insufficient documentation

## 2013-04-04 DIAGNOSIS — F17200 Nicotine dependence, unspecified, uncomplicated: Secondary | ICD-10-CM | POA: Insufficient documentation

## 2013-04-04 DIAGNOSIS — Z23 Encounter for immunization: Secondary | ICD-10-CM | POA: Diagnosis not present

## 2013-04-04 DIAGNOSIS — S0101XA Laceration without foreign body of scalp, initial encounter: Secondary | ICD-10-CM

## 2013-04-04 DIAGNOSIS — S0100XA Unspecified open wound of scalp, initial encounter: Secondary | ICD-10-CM | POA: Diagnosis not present

## 2013-04-04 DIAGNOSIS — S0990XA Unspecified injury of head, initial encounter: Secondary | ICD-10-CM | POA: Diagnosis not present

## 2013-04-04 DIAGNOSIS — R011 Cardiac murmur, unspecified: Secondary | ICD-10-CM | POA: Diagnosis not present

## 2013-04-04 DIAGNOSIS — I10 Essential (primary) hypertension: Secondary | ICD-10-CM | POA: Diagnosis not present

## 2013-04-04 DIAGNOSIS — W19XXXA Unspecified fall, initial encounter: Secondary | ICD-10-CM | POA: Insufficient documentation

## 2013-04-04 DIAGNOSIS — Z79899 Other long term (current) drug therapy: Secondary | ICD-10-CM | POA: Diagnosis not present

## 2013-04-04 DIAGNOSIS — Y939 Activity, unspecified: Secondary | ICD-10-CM | POA: Insufficient documentation

## 2013-04-04 DIAGNOSIS — F0390 Unspecified dementia without behavioral disturbance: Secondary | ICD-10-CM | POA: Insufficient documentation

## 2013-04-04 DIAGNOSIS — S0993XA Unspecified injury of face, initial encounter: Secondary | ICD-10-CM | POA: Diagnosis not present

## 2013-04-04 DIAGNOSIS — Y929 Unspecified place or not applicable: Secondary | ICD-10-CM | POA: Insufficient documentation

## 2013-04-04 MED ORDER — TETANUS-DIPHTH-ACELL PERTUSSIS 5-2.5-18.5 LF-MCG/0.5 IM SUSP
0.5000 mL | Freq: Once | INTRAMUSCULAR | Status: AC
  Administered 2013-04-04: 0.5 mL via INTRAMUSCULAR
  Filled 2013-04-04: qty 0.5

## 2013-04-04 MED ORDER — OXYCODONE-ACETAMINOPHEN 5-325 MG PO TABS
2.0000 | ORAL_TABLET | Freq: Once | ORAL | Status: AC
  Administered 2013-04-04: 2 via ORAL
  Filled 2013-04-04: qty 2

## 2013-04-04 NOTE — ED Notes (Signed)
Pressure held to site for more than 20 minutes.  When released, no oozing noted from laceration. Will continue to monitor.

## 2013-04-04 NOTE — ED Notes (Signed)
No further oozing noted from suture site.

## 2013-04-04 NOTE — ED Notes (Signed)
Daughter states mother walked into the room and she noticed her head was bleeding. States she looked where she came from and could not find any blood anywhere. Patient has knot and laceration noted to back of head. Patient has dementia and is unable to tell what happened. Bleeding noted from back of head at this time.

## 2013-04-04 NOTE — ED Notes (Signed)
Profuse bleeding from R posterior skull.  Unable to visualize area of injury even after cleaning site d/t thick hair covering.  Cut hair away from site with scissors and localized laceration w/point of arterial bleeding.  Dr. Stevie Kern notified and in to observe.  He applied staples to laceration and identified area of arterial bleed.  Pressure held to site.

## 2013-04-04 NOTE — ED Provider Notes (Signed)
CSN: [id number]     Arrival date & time 04/04/13  2109 History   First MD Initiated Contact with Patient 04/04/13 2127    This chart was scribed for Babette Relic, MD by Terressa Koyanagi, ED Scribe and Jenne Campus, ED Scribe. This patient was seen in room APA05/APA05 and the patient's care was started at 9:29 PM.  Chief Complaint  Patient presents with  . Head Injury  Level 5 Caveat For Dementia and Schizophrenia    The history is provided by a relative. No language interpreter was used.   HPI Comments: Brittney Wall is a 68 y.o. female with dementia who presents to the Emergency Department for a head injury from a possible fall tonight. Pt's daughter provided the history. Pt's daughter reports that pt is weak and confused and combative at baseline and is supposed to use a walker but refuses. She states that she realized the pt injured her head when she saw the bleeding. Pt's daughter states that pt was walking after the fall. Pt acting at baseline according to family. No apparent pain.   Past Medical History  Diagnosis Date  . Hypertension   . Schizophrenia   . Dementia    Past Surgical History  Procedure Laterality Date  . Ortho procedure     History reviewed. No pertinent family history. History  Substance Use Topics  . Smoking status: Current Every Day Smoker -- 2.00 packs/day  . Smokeless tobacco: Not on file  . Alcohol Use: No   No OB history provided.  Review of Systems  Unable to perform ROS: Dementia    Allergies  Review of patient's allergies indicates no known allergies.  Home Medications   Current Outpatient Rx  Name  Route  Sig  Dispense  Refill  . cloNIDine (CATAPRES) 0.1 MG tablet   Oral   Take 0.1 mg by mouth 3 (three) times daily.           . haloperidol decanoate (HALDOL DECANOATE) 100 MG/ML injection   Intramuscular   Inject 100 mg into the muscle every 28 (twenty-eight) days.         . hydrALAZINE (APRESOLINE) 10 MG tablet   Oral    Take 10 mg by mouth at bedtime.          Marland Kitchen LORazepam (ATIVAN) 0.5 MG tablet   Oral   Take 0.5 mg by mouth daily. For anixety         . mirtazapine (REMERON) 30 MG tablet   Oral   Take 30 mg by mouth at bedtime.           . potassium chloride (K-DUR) 10 MEQ tablet   Oral   Take 10 mEq by mouth at bedtime.         . pravastatin (PRAVACHOL) 20 MG tablet   Oral   Take 20 mg by mouth daily.         . traZODone (DESYREL) 50 MG tablet   Oral   Take 50 mg by mouth at bedtime.            BP 140/67  Pulse 95  Temp(Src) 98.4 F (36.9 C) (Oral)  Resp 16  Ht 5\' 4"  (1.626 m)  Wt 95 lb (43.092 kg)  BMI 16.30 kg/m2  SpO2 94% Physical Exam  Nursing note and vitals reviewed. Constitutional:  Awake, alert, nontoxic appearance.  HENT:  Head: Atraumatic.  Hematoma and abrasion posterior scalp. No visible or palpable laceration noted. However, Pt has thick  matted, curly hair limiting examination.   Eyes: Right eye exhibits no discharge. Left eye exhibits no discharge.  Neck: Neck supple.  Neck not tender  Cardiovascular:  Murmur heard. Pulmonary/Chest: Effort normal and breath sounds normal. She exhibits no tenderness.  Abdominal: Soft. There is no tenderness. There is no rebound.  Musculoskeletal: She exhibits no tenderness (Back not tender; arms & legs not tender).  Baseline ROM, no obvious new focal weakness.  Neurological:  Mental status and motor strength appears baseline for patient and situation. Pt moves all 4 extremities spontaneously.  Was walking indepoendently prior to arrival to the ED.   Skin: Skin is warm and dry. No rash noted.    ED Course  Procedures (including critical care time)  DIAGNOSTIC STUDIES: Oxygen Saturation is 94% on room air, adequate by my interpretation.    COORDINATION OF CARE: 9:36 PM-Discussed treatment plan which includes nurses administer wound care, with pt and pt's daughter at bedside and pt's daughter agreed to plan.  Pt  feels improved after observation and/or treatment in ED. Patient / Family / Caregiver informed of clinical course, understand medical decision-making process, and agree with plan. LACERATION REPAIR Performed by: Babette Relic Consent: Verbal consent obtained from family. Patient identity confirmed: provided demographic data Time out performed prior to procedure Prepped in normal fashion Did have small arterial bleeding controlled with local pressure. Wound explored no FB noted Laceration Location: occipital scalp Laceration Length: 1.5 cm No Foreign Bodies seen or palpated Amount of cleaning: standard Safclens Number of sutures or staples: 3 staples Patient tolerance: Patient tolerated the procedure well with no immediate complications.  Labs Review Labs Reviewed - No data to display Imaging Review Ct Head Wo Contrast  04/05/2013   CLINICAL DATA:  Right posterior are scalp laceration. Evaluate injury.  EXAM: CT HEAD WITHOUT CONTRAST  CT CERVICAL SPINE WITHOUT CONTRAST  TECHNIQUE: Multidetector CT imaging of the head and cervical spine was performed following the standard protocol without intravenous contrast. Multiplanar CT image reconstructions of the cervical spine were also generated.  COMPARISON:  CT HEAD W/O CM dated 01/10/2012  FINDINGS: CT HEAD FINDINGS  The ventricles and sulci are normal for age. No intraparenchymal hemorrhage, mass effect nor midline shift. Patchy supratentorial white matter hypodensities are within normal range for patient's age and though non-specific suggest sequelae of chronic small vessel ischemic disease. No acute large vascular territory infarcts.  No abnormal extra-axial fluid collections. Basal cisterns are patent. Moderate calcific atherosclerosis of the carotid siphons.  No skull fracture. Small right parietal scalp hematoma with overlying skin staples, no subcutaneous gas. No additional radiopaque foreign bodies. Atretic right maxillary sinus consistent  with chronic sinusitis associated with wall thickening and paranasal sinus mucosal thickening, small right maxillary mucosal retention cyst. Mastoid air cells are well aerated. Expanded fluid filled sella. The included ocular globes and orbital contents are non-suspicious. Patient is edentulous. Temporomandibular osteoarthrosis.  CT CERVICAL SPINE FINDINGS  Cervical vertebral bodies and posterior elements are intact and aligned with maintenance of cervical lordosis. Severe C5-6 and C6-7 degenerative disc disease, mild at C3-4 and C4-5. C1-2 articulation maintained with severe arthropathy in calcified apical ligament. No destructive bony lesions.  Heterogeneous thyroid gland with the right thyroid 11 mm nodule, this would be better characterized on thyroid sonography as clinically indicated. Mild calcific atherosclerosis of the carotid bulbs. Included view of the lungs demonstrates severe centrilobular emphysema.  IMPRESSION: CT head: Small right parietal scalp hematoma without underlying skull fracture and no acute intracranial process.  Involutional changes. Mild to moderate white matter changes suggest chronic small vessel ischemic disease.  Fluid expanded sella may reflect empty sella or arachnoid cyst, unchanged.  CT cervical spine: Degenerative change of the cervical spine without acute fracture or nor malalignment.   Electronically Signed   By: Elon Alas   On: 04/05/2013 00:42   Ct Cervical Spine Wo Contrast  04/05/2013   CLINICAL DATA:  Right posterior are scalp laceration. Evaluate injury.  EXAM: CT HEAD WITHOUT CONTRAST  CT CERVICAL SPINE WITHOUT CONTRAST  TECHNIQUE: Multidetector CT imaging of the head and cervical spine was performed following the standard protocol without intravenous contrast. Multiplanar CT image reconstructions of the cervical spine were also generated.  COMPARISON:  CT HEAD W/O CM dated 01/10/2012  FINDINGS: CT HEAD FINDINGS  The ventricles and sulci are normal for age. No  intraparenchymal hemorrhage, mass effect nor midline shift. Patchy supratentorial white matter hypodensities are within normal range for patient's age and though non-specific suggest sequelae of chronic small vessel ischemic disease. No acute large vascular territory infarcts.  No abnormal extra-axial fluid collections. Basal cisterns are patent. Moderate calcific atherosclerosis of the carotid siphons.  No skull fracture. Small right parietal scalp hematoma with overlying skin staples, no subcutaneous gas. No additional radiopaque foreign bodies. Atretic right maxillary sinus consistent with chronic sinusitis associated with wall thickening and paranasal sinus mucosal thickening, small right maxillary mucosal retention cyst. Mastoid air cells are well aerated. Expanded fluid filled sella. The included ocular globes and orbital contents are non-suspicious. Patient is edentulous. Temporomandibular osteoarthrosis.  CT CERVICAL SPINE FINDINGS  Cervical vertebral bodies and posterior elements are intact and aligned with maintenance of cervical lordosis. Severe C5-6 and C6-7 degenerative disc disease, mild at C3-4 and C4-5. C1-2 articulation maintained with severe arthropathy in calcified apical ligament. No destructive bony lesions.  Heterogeneous thyroid gland with the right thyroid 11 mm nodule, this would be better characterized on thyroid sonography as clinically indicated. Mild calcific atherosclerosis of the carotid bulbs. Included view of the lungs demonstrates severe centrilobular emphysema.  IMPRESSION: CT head: Small right parietal scalp hematoma without underlying skull fracture and no acute intracranial process.  Involutional changes. Mild to moderate white matter changes suggest chronic small vessel ischemic disease.  Fluid expanded sella may reflect empty sella or arachnoid cyst, unchanged.  CT cervical spine: Degenerative change of the cervical spine without acute fracture or nor malalignment.    Electronically Signed   By: Elon Alas   On: 04/05/2013 00:42    EKG Interpretation   None       MDM   Final diagnoses:  Occipital scalp laceration  Minor head injury   I doubt any other EMC precluding discharge at this time including, but not necessarily limited to the following:ICH, CSI. I personally performed the services described in this documentation, which was scribed in my presence. The recorded information has been reviewed and is accurate.   Babette Relic, MD 04/06/13 2049

## 2013-04-05 DIAGNOSIS — S0993XA Unspecified injury of face, initial encounter: Secondary | ICD-10-CM | POA: Diagnosis not present

## 2013-04-05 DIAGNOSIS — S0100XA Unspecified open wound of scalp, initial encounter: Secondary | ICD-10-CM | POA: Diagnosis not present

## 2013-04-05 NOTE — ED Notes (Signed)
Patient with no complaints at this time. Respirations even and unlabored. Skin warm/dry. Discharge instructions reviewed with patient at this time. Patient given opportunity to voice concerns/ask questions. Patient discharged at this time and left Emergency Department with steady gait.   

## 2013-04-05 NOTE — Discharge Instructions (Signed)
A laceration is a cut or lesion that goes through all layers of the skin and into the tissue just beneath the skin. This may have been repaired by your caregiver.  SEEK MEDICAL ATTENTION IF: There is redness, swelling, increasing pain in the wound  There is a red line that goes up your arm or leg.  Pus is coming from wound.  You develop an unexplained temperature above 100.4.  You notice a foul smell coming from the wound or dressing.  There is a breaking open of the wound (edges not staying together) after sutures have been removed. If you did not receive a tetanus shot today because you thought you were up to date, but did not recall when your last one was given, make sure to check with your primary caregiver to determine if you need one.  You have had a head injury which does not appear to require admission at this time. A concussion is a state of changed mental ability from trauma. SEEK IMMEDIATE MEDICAL ATTENTION IF: There is confusion or drowsiness (although children frequently become drowsy after injury).  You cannot awaken the injured person.  There is nausea (feeling sick to your stomach) or continued, forceful vomiting.  You notice dizziness or unsteadiness which is getting worse, or inability to walk.  You have convulsions or unconsciousness.  You experience severe, persistent headaches not relieved by Tylenol?. (Do not take aspirin as this impairs clotting abilities). Take other pain medications only as directed.  You cannot use arms or legs normally.  There are changes in pupil sizes. (This is the black center in the colored part of the eye)  There is clear or bloody discharge from the nose or ears.  Change in speech, vision, swallowing, or understanding.  Localized weakness, numbness, tingling, or change in bowel or bladder control.  Head your doctor consider further evaluation of heart murmur and thyroid nodule.  Staple removal in 7-8 days at your doctor.

## 2013-04-13 DIAGNOSIS — J438 Other emphysema: Secondary | ICD-10-CM | POA: Diagnosis not present

## 2013-04-13 DIAGNOSIS — I10 Essential (primary) hypertension: Secondary | ICD-10-CM | POA: Diagnosis not present

## 2013-04-13 DIAGNOSIS — F0390 Unspecified dementia without behavioral disturbance: Secondary | ICD-10-CM | POA: Diagnosis not present

## 2013-04-13 DIAGNOSIS — R634 Abnormal weight loss: Secondary | ICD-10-CM | POA: Diagnosis not present

## 2013-04-29 ENCOUNTER — Other Ambulatory Visit (HOSPITAL_COMMUNITY): Payer: Self-pay | Admitting: Family Medicine

## 2013-04-29 DIAGNOSIS — E041 Nontoxic single thyroid nodule: Secondary | ICD-10-CM

## 2013-05-03 ENCOUNTER — Other Ambulatory Visit (HOSPITAL_COMMUNITY): Payer: Medicare Other

## 2013-05-03 ENCOUNTER — Ambulatory Visit (HOSPITAL_COMMUNITY)
Admission: RE | Admit: 2013-05-03 | Discharge: 2013-05-03 | Disposition: A | Discharge status: Home / Self Care | Payer: Medicare Other | Source: Ambulatory Visit | Attending: Family Medicine | Admitting: Family Medicine

## 2013-05-03 DIAGNOSIS — E042 Nontoxic multinodular goiter: Secondary | ICD-10-CM | POA: Insufficient documentation

## 2013-05-03 DIAGNOSIS — E041 Nontoxic single thyroid nodule: Secondary | ICD-10-CM | POA: Insufficient documentation

## 2013-05-06 DIAGNOSIS — F209 Schizophrenia, unspecified: Secondary | ICD-10-CM | POA: Diagnosis not present

## 2013-05-13 ENCOUNTER — Other Ambulatory Visit (HOSPITAL_COMMUNITY): Payer: Self-pay | Admitting: "Endocrinology

## 2013-05-13 DIAGNOSIS — E042 Nontoxic multinodular goiter: Secondary | ICD-10-CM | POA: Diagnosis not present

## 2013-05-13 DIAGNOSIS — IMO0001 Reserved for inherently not codable concepts without codable children: Secondary | ICD-10-CM | POA: Diagnosis not present

## 2013-05-13 DIAGNOSIS — R221 Localized swelling, mass and lump, neck: Secondary | ICD-10-CM

## 2013-05-25 ENCOUNTER — Encounter (HOSPITAL_COMMUNITY): Payer: Self-pay

## 2013-05-25 ENCOUNTER — Ambulatory Visit (HOSPITAL_COMMUNITY)
Admission: RE | Admit: 2013-05-25 | Discharge: 2013-05-25 | Disposition: A | Discharge status: Home / Self Care | Payer: Medicare Other | Source: Ambulatory Visit | Attending: "Endocrinology | Admitting: "Endocrinology

## 2013-05-25 DIAGNOSIS — R221 Localized swelling, mass and lump, neck: Secondary | ICD-10-CM

## 2013-05-25 MED ORDER — LIDOCAINE HCL (PF) 2 % IJ SOLN: 10.0000 mL | Freq: Once | INTRAMUSCULAR | Status: DC

## 2013-05-25 MED ORDER — LIDOCAINE HCL (PF) 2 % IJ SOLN
INTRAMUSCULAR | Status: AC
Start: 2013-05-25 — End: 2013-05-25
  Filled 2013-05-25: qty 10

## 2013-05-28 DIAGNOSIS — F0390 Unspecified dementia without behavioral disturbance: Secondary | ICD-10-CM | POA: Diagnosis not present

## 2013-05-28 DIAGNOSIS — I10 Essential (primary) hypertension: Secondary | ICD-10-CM | POA: Diagnosis not present

## 2013-05-28 DIAGNOSIS — F2089 Other schizophrenia: Secondary | ICD-10-CM | POA: Diagnosis not present

## 2013-06-03 DIAGNOSIS — E042 Nontoxic multinodular goiter: Secondary | ICD-10-CM | POA: Diagnosis not present

## 2013-07-27 DIAGNOSIS — F0390 Unspecified dementia without behavioral disturbance: Secondary | ICD-10-CM | POA: Diagnosis not present

## 2013-07-27 DIAGNOSIS — J438 Other emphysema: Secondary | ICD-10-CM | POA: Diagnosis not present

## 2013-07-27 DIAGNOSIS — R634 Abnormal weight loss: Secondary | ICD-10-CM | POA: Diagnosis not present

## 2013-07-27 DIAGNOSIS — F2089 Other schizophrenia: Secondary | ICD-10-CM | POA: Diagnosis not present

## 2013-07-27 DIAGNOSIS — I10 Essential (primary) hypertension: Secondary | ICD-10-CM | POA: Diagnosis not present

## 2013-07-29 DIAGNOSIS — F209 Schizophrenia, unspecified: Secondary | ICD-10-CM | POA: Diagnosis not present

## 2013-10-20 DIAGNOSIS — F209 Schizophrenia, unspecified: Secondary | ICD-10-CM | POA: Diagnosis not present

## 2013-11-17 ENCOUNTER — Other Ambulatory Visit (HOSPITAL_COMMUNITY): Payer: Self-pay | Admitting: "Endocrinology

## 2013-11-17 DIAGNOSIS — E049 Nontoxic goiter, unspecified: Secondary | ICD-10-CM

## 2013-11-19 DIAGNOSIS — F209 Schizophrenia, unspecified: Secondary | ICD-10-CM | POA: Diagnosis not present

## 2013-12-01 ENCOUNTER — Emergency Department (HOSPITAL_COMMUNITY): Payer: No Typology Code available for payment source

## 2013-12-01 ENCOUNTER — Emergency Department (HOSPITAL_COMMUNITY)
Admission: EM | Admit: 2013-12-01 | Discharge: 2013-12-01 | Disposition: A | Discharge status: Home / Self Care | Payer: No Typology Code available for payment source | Attending: Emergency Medicine | Admitting: Emergency Medicine

## 2013-12-01 ENCOUNTER — Ambulatory Visit (HOSPITAL_COMMUNITY)
Admission: RE | Admit: 2013-12-01 | Discharge: 2013-12-01 | Disposition: A | Discharge status: Home / Self Care | Payer: Medicare Other | Source: Ambulatory Visit | Attending: "Endocrinology | Admitting: "Endocrinology

## 2013-12-01 ENCOUNTER — Other Ambulatory Visit (HOSPITAL_COMMUNITY): Payer: Medicare Other

## 2013-12-01 ENCOUNTER — Encounter (HOSPITAL_COMMUNITY): Payer: Self-pay | Admitting: Emergency Medicine

## 2013-12-01 DIAGNOSIS — I10 Essential (primary) hypertension: Secondary | ICD-10-CM | POA: Insufficient documentation

## 2013-12-01 DIAGNOSIS — S298XXA Other specified injuries of thorax, initial encounter: Secondary | ICD-10-CM | POA: Diagnosis not present

## 2013-12-01 DIAGNOSIS — Z720 Tobacco use: Secondary | ICD-10-CM | POA: Diagnosis not present

## 2013-12-01 DIAGNOSIS — Z79899 Other long term (current) drug therapy: Secondary | ICD-10-CM | POA: Insufficient documentation

## 2013-12-01 DIAGNOSIS — Y9389 Activity, other specified: Secondary | ICD-10-CM | POA: Insufficient documentation

## 2013-12-01 DIAGNOSIS — S0083XA Contusion of other part of head, initial encounter: Secondary | ICD-10-CM | POA: Diagnosis not present

## 2013-12-01 DIAGNOSIS — F0391 Unspecified dementia with behavioral disturbance: Secondary | ICD-10-CM | POA: Insufficient documentation

## 2013-12-01 DIAGNOSIS — F209 Schizophrenia, unspecified: Secondary | ICD-10-CM | POA: Diagnosis not present

## 2013-12-01 DIAGNOSIS — S0093XA Contusion of unspecified part of head, initial encounter: Secondary | ICD-10-CM | POA: Diagnosis not present

## 2013-12-01 DIAGNOSIS — S3993XA Unspecified injury of pelvis, initial encounter: Secondary | ICD-10-CM | POA: Diagnosis not present

## 2013-12-01 DIAGNOSIS — Y92410 Unspecified street and highway as the place of occurrence of the external cause: Secondary | ICD-10-CM | POA: Insufficient documentation

## 2013-12-01 DIAGNOSIS — E049 Nontoxic goiter, unspecified: Secondary | ICD-10-CM

## 2013-12-01 DIAGNOSIS — E042 Nontoxic multinodular goiter: Secondary | ICD-10-CM | POA: Insufficient documentation

## 2013-12-01 DIAGNOSIS — F606 Avoidant personality disorder: Secondary | ICD-10-CM | POA: Insufficient documentation

## 2013-12-01 DIAGNOSIS — S199XXA Unspecified injury of neck, initial encounter: Secondary | ICD-10-CM | POA: Diagnosis not present

## 2013-12-01 DIAGNOSIS — S3992XA Unspecified injury of lower back, initial encounter: Secondary | ICD-10-CM | POA: Diagnosis not present

## 2013-12-01 DIAGNOSIS — T148XXA Other injury of unspecified body region, initial encounter: Secondary | ICD-10-CM

## 2013-12-01 DIAGNOSIS — S0990XA Unspecified injury of head, initial encounter: Secondary | ICD-10-CM | POA: Diagnosis not present

## 2013-12-01 MED ORDER — HALOPERIDOL LACTATE 5 MG/ML IJ SOLN
5.0000 mg | Freq: Once | INTRAMUSCULAR | Status: AC
  Administered 2013-12-01: 5 mg via INTRAMUSCULAR
  Filled 2013-12-01: qty 1

## 2013-12-01 MED ORDER — LORAZEPAM 2 MG/ML IJ SOLN
1.0000 mg | Freq: Once | INTRAMUSCULAR | Status: AC
  Administered 2013-12-01: 1 mg via INTRAMUSCULAR
  Filled 2013-12-01: qty 1
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
## 2013-12-01 NOTE — Discharge Instructions (Signed)
Contusion °A contusion is a deep bruise. Contusions are the result of an injury that caused bleeding under the skin. The contusion may turn blue, purple, or yellow. Minor injuries will give you a painless contusion, but more severe contusions may stay painful and swollen for a few weeks.  °CAUSES  °A contusion is usually caused by a blow, trauma, or direct force to an area of the body. °SYMPTOMS  °· Swelling and redness of the injured area. °· Bruising of the injured area. °· Tenderness and soreness of the injured area. °· Pain. °DIAGNOSIS  °The diagnosis can be made by taking a history and physical exam. An X-ray, CT scan, or MRI may be needed to determine if there were any associated injuries, such as fractures. °TREATMENT  °Specific treatment will depend on what area of the body was injured. In general, the best treatment for a contusion is resting, icing, elevating, and applying cold compresses to the injured area. Over-the-counter medicines may also be recommended for pain control. Ask your caregiver what the best treatment is for your contusion. °HOME CARE INSTRUCTIONS  °· Put ice on the injured area. °¨ Put ice in a plastic bag. °¨ Place a towel between your skin and the bag. °¨ Leave the ice on for 15-20 minutes, 3-4 times a day, or as directed by your health care provider. °· Only take over-the-counter or prescription medicines for pain, discomfort, or fever as directed by your caregiver. Your caregiver may recommend avoiding anti-inflammatory medicines (aspirin, ibuprofen, and naproxen) for 48 hours because these medicines may increase bruising. °· Rest the injured area. °· If possible, elevate the injured area to reduce swelling. °SEEK IMMEDIATE MEDICAL CARE IF:  °· You have increased bruising or swelling. °· You have pain that is getting worse. °· Your swelling or pain is not relieved with medicines. °MAKE SURE YOU:  °· Understand these instructions. °· Will watch your condition. °· Will get help right  away if you are not doing well or get worse. °Document Released: 11/07/2004 Document Revised: 02/02/2013 Document Reviewed: 12/03/2010 °[employer]® Patient Information ©2015 [employer]. This information is not intended to replace advice given to you by your health care provider. Make sure you discuss any questions you have with your health care provider. ° °
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
## 2014-01-23 ENCOUNTER — Encounter (HOSPITAL_COMMUNITY): Payer: Self-pay

## 2014-01-23 ENCOUNTER — Emergency Department (HOSPITAL_COMMUNITY)
Admission: EM | Admit: 2014-01-23 | Discharge: 2014-01-23 | Disposition: A | Discharge status: Home / Self Care | Payer: Medicare Other | Attending: Emergency Medicine | Admitting: Emergency Medicine

## 2014-01-23 ENCOUNTER — Emergency Department (HOSPITAL_COMMUNITY): Payer: Medicare Other

## 2014-01-23 DIAGNOSIS — S0990XA Unspecified injury of head, initial encounter: Secondary | ICD-10-CM | POA: Diagnosis present

## 2014-01-23 DIAGNOSIS — S199XXA Unspecified injury of neck, initial encounter: Secondary | ICD-10-CM | POA: Diagnosis not present

## 2014-01-23 DIAGNOSIS — W0110XA Fall on same level from slipping, tripping and stumbling with subsequent striking against unspecified object, initial encounter: Secondary | ICD-10-CM | POA: Insufficient documentation

## 2014-01-23 DIAGNOSIS — Y92031 Bathroom in apartment as the place of occurrence of the external cause: Secondary | ICD-10-CM | POA: Insufficient documentation

## 2014-01-23 DIAGNOSIS — F209 Schizophrenia, unspecified: Secondary | ICD-10-CM | POA: Insufficient documentation

## 2014-01-23 DIAGNOSIS — Z720 Tobacco use: Secondary | ICD-10-CM | POA: Diagnosis not present

## 2014-01-23 DIAGNOSIS — S161XXA Strain of muscle, fascia and tendon at neck level, initial encounter: Secondary | ICD-10-CM | POA: Insufficient documentation

## 2014-01-23 DIAGNOSIS — S0181XA Laceration without foreign body of other part of head, initial encounter: Secondary | ICD-10-CM | POA: Diagnosis not present

## 2014-01-23 DIAGNOSIS — I10 Essential (primary) hypertension: Secondary | ICD-10-CM | POA: Diagnosis not present

## 2014-01-23 DIAGNOSIS — Y998 Other external cause status: Secondary | ICD-10-CM | POA: Diagnosis not present

## 2014-01-23 DIAGNOSIS — S098XXA Other specified injuries of head, initial encounter: Secondary | ICD-10-CM | POA: Diagnosis not present

## 2014-01-23 DIAGNOSIS — Z79899 Other long term (current) drug therapy: Secondary | ICD-10-CM | POA: Insufficient documentation

## 2014-01-23 DIAGNOSIS — W19XXXA Unspecified fall, initial encounter: Secondary | ICD-10-CM

## 2014-01-23 DIAGNOSIS — Y9301 Activity, walking, marching and hiking: Secondary | ICD-10-CM | POA: Insufficient documentation

## 2014-01-23 DIAGNOSIS — S0083XA Contusion of other part of head, initial encounter: Secondary | ICD-10-CM | POA: Diagnosis not present

## 2014-01-23 MED ORDER — LIDOCAINE-EPINEPHRINE (PF) 1 %-1:200000 IJ SOLN
INTRAMUSCULAR | Status: AC
  Administered 2014-01-23: 05:00:00
  Filled 2014-01-23: qty 10

## 2014-01-23 MED ORDER — KETAMINE HCL 10 MG/ML IJ SOLN
1.0000 mg/kg | Freq: Once | INTRAMUSCULAR | Status: AC
  Filled 2014-01-23: qty 1

## 2014-01-23 MED ORDER — KETAMINE HCL 10 MG/ML IJ SOLN
INTRAMUSCULAR | Status: AC | PRN
  Administered 2014-01-23: 43.1 mg via INTRAVENOUS

## 2014-01-23 NOTE — ED Provider Notes (Signed)
CSN: [id number]     Arrival date & time 01/23/14  0254 History   First MD Initiated Contact with Patient 01/23/14 [phone]     Chief Complaint  Patient presents with  . Facial Laceration     (Consider location/radiation/quality/duration/timing/severity/associated sxs/prior Treatment) HPI Comments: Patient is a 68 year old female with history of hypertension, dementia, and schizophrenia. She is brought by family after a fall that occurred at home. She is apparently walking to the bathroom when she tripped and fell and struck her head. She sustained a laceration to the forehead extending between her eyes to the bridge of her nose. There is also some bleeding from the nose. Patient adds little to the history due to coexisting medical problems.  Patient is a 68 y.o. female presenting with fall. The history is provided by the patient.  Fall This is a new problem. The current episode started less than 1 hour ago. The problem occurs constantly. The problem has not changed since onset.Associated symptoms include headaches. Pertinent negatives include no chest pain. Nothing aggravates the symptoms. Nothing relieves the symptoms. She has tried nothing for the symptoms. The treatment provided no relief.    Past Medical History  Diagnosis Date  . Hypertension   . Schizophrenia   . Dementia    Past Surgical History  Procedure Laterality Date  . Ortho procedure     No family history on file. History  Substance Use Topics  . Smoking status: Current Every Day Smoker -- 2.00 packs/day    Types: Cigarettes  . Smokeless tobacco: Not on file  . Alcohol Use: No   OB History    No data available     Review of Systems  Cardiovascular: Negative for chest pain.  Neurological: Positive for headaches.  All other systems reviewed and are negative.     Allergies  Review of patient's allergies indicates no known allergies.  Home Medications   Prior to Admission medications   Medication Sig Start  Date End Date Taking? Authorizing Provider  cloNIDine (CATAPRES) 0.1 MG tablet Take 0.1 mg by mouth 3 (three) times daily.     Yes Historical Provider, MD  haloperidol decanoate (HALDOL DECANOATE) 100 MG/ML injection Inject 100 mg into the muscle every 28 (twenty-eight) days.   Yes Historical Provider, MD  hydrALAZINE (APRESOLINE) 10 MG tablet Take 10 mg by mouth at bedtime.    Yes Historical Provider, MD  LORazepam (ATIVAN) 0.5 MG tablet Take 0.5 mg by mouth daily. For anixety   Yes Historical Provider, MD  mirtazapine (REMERON) 30 MG tablet Take 30 mg by mouth at bedtime.     Yes Historical Provider, MD  potassium chloride (K-DUR) 10 MEQ tablet Take 10 mEq by mouth at bedtime. 01/06/12  Yes Almyra Free Idol, PA-C  pravastatin (PRAVACHOL) 20 MG tablet Take 20 mg by mouth daily.   Yes Historical Provider, MD  traZODone (DESYREL) 50 MG tablet Take 50 mg by mouth at bedtime.     Yes Historical Provider, MD   BP 233/113 mmHg  Pulse 93  Temp(Src) 97.6 F (36.4 C) (Oral)  Resp 14  Ht 5\' 6"  (1.676 m)  Wt 95 lb (43.092 kg)  BMI 15.34 kg/m2  SpO2 100% Physical Exam  Constitutional: She appears well-developed and well-nourished.  HENT:  Head: Normocephalic.  There is a 3.5 cm laceration that extends from the mid lower for head, between the eyes, to the bridge of the nose.  There is no nasal deformity and no septal hematoma. There is slight bleeding  from both nares.   The TMs are clear bilaterally.  Eyes: EOM are normal. Pupils are equal, round, and reactive to light.  Neck: Normal range of motion. Neck supple.  There is no cervical spine tenderness.  Cardiovascular: Normal rate, regular rhythm and normal heart sounds.   No murmur heard. Pulmonary/Chest: Effort normal and breath sounds normal. No respiratory distress. She has no wheezes.  Abdominal: Soft. Bowel sounds are normal. She exhibits no distension. There is no tenderness.  Lymphadenopathy:    She has no cervical adenopathy.   Neurological: She is alert. No cranial nerve deficit. Coordination normal.  Neurologic exam is difficult due to dementia and schizophrenia. She moves all extremities with purpose. She is somewhat confused and occasionally curses at the staff.  Skin: Skin is warm and dry.  Nursing note and vitals reviewed.   ED Course  Procedures (including critical care time) Labs Review Labs Reviewed - No data to display  Imaging Review No results found.   EKG Interpretation None     LACERATION REPAIR Performed by: Veryl Speak Authorized by: Veryl Speak Consent: Verbal consent obtained. Risks and benefits: risks, benefits and alternatives were discussed Consent given by: patient Patient identity confirmed: provided demographic data Prepped and Draped in normal sterile fashion Wound explored  Laceration Location: Forehead  Laceration Length: 3.5 cm  No Foreign Bodies seen or palpated  Anesthesia: local infiltration  Local anesthetic: lidocaine 1 % with epinephrine  Anesthetic total: 3 ml  Irrigation method: syringe Amount of cleaning: standard  Skin closure: 5-0 Prolene   Number of sutures: 8   Technique: Simple interrupted   Patient tolerance: Patient tolerated the procedure well with no immediate complications.   MDM   Final diagnoses:  None    Patient presents here after a fall. She has a sizable laceration to the 4 head as described above. This was closed with the assistance of conscious sedation. Please see the nurse's note for this. She underwent CT scans of the head, facial bones, and cervical spine. These were unremarkable for fracture or misalignment. There is no evidence for intracranial injury. She will be discharged to home with suture removal in the next 7-8 days and return as needed with she develops any new or concerning issues.    Veryl Speak, MD 01/23/14 [phone]

## 2014-01-23 NOTE — ED Notes (Signed)
Pt resting, family at bedside.  

## 2014-01-23 NOTE — Sedation Documentation (Signed)
Family updated as to patient's status.

## 2014-01-23 NOTE — ED Notes (Signed)
Patient apparently fell in the bathroom this am, has large laceration from between her eyes down across the bridge of her nose, continues to bleed, no apparent loc

## 2014-01-23 NOTE — ED Notes (Signed)
Family given discharge instructions & paperwork. Pt carried out by wheelchair.

## 2014-01-23 NOTE — Sedation Documentation (Signed)
Pt talking & pulling at wires. Family at bedsied holding pt hands.

## 2014-01-23 NOTE — Sedation Documentation (Signed)
Pt opening eyes.

## 2014-01-23 NOTE — Discharge Instructions (Signed)
Local wound care with bacitracin application twice daily.  Sutures are to be removed in 7 or 8 days. Follow-up with your primary Dr. for this. Return to the ER if you develop increased redness, purulent drainage, or other new and concerning symptoms.   Facial Laceration  A facial laceration is a cut on the face. These injuries can be painful and cause bleeding. Lacerations usually heal quickly, but they need special care to reduce scarring. DIAGNOSIS  Your health care provider will take a medical history, ask for details about how the injury occurred, and examine the wound to determine how deep the cut is. TREATMENT  Some facial lacerations may not require closure. Others may not be able to be closed because of an increased risk of infection. The risk of infection and the chance for successful closure will depend on various factors, including the amount of time since the injury occurred. The wound may be cleaned to help prevent infection. If closure is appropriate, pain medicines may be given if needed. Your health care provider will use stitches (sutures), wound glue (adhesive), or skin adhesive strips to repair the laceration. These tools bring the skin edges together to allow for faster healing and a better cosmetic outcome. If needed, you may also be given a tetanus shot. HOME CARE INSTRUCTIONS  Only take over-the-counter or prescription medicines as directed by your health care provider.  Follow your health care provider's instructions for wound care. These instructions will vary depending on the technique used for closing the wound. For Sutures:  Keep the wound clean and dry.   If you were given a bandage (dressing), you should change it at least once a day. Also change the dressing if it becomes wet or dirty, or as directed by your health care provider.   Wash the wound with soap and water 2 times a day. Rinse the wound off with water to remove all soap. Pat the wound dry with a clean  towel.   After cleaning, apply a thin layer of the antibiotic ointment recommended by your health care provider. This will help prevent infection and keep the dressing from sticking.   You may shower as usual after the first 24 hours. Do not soak the wound in water until the sutures are removed.   Get your sutures removed as directed by your health care provider. With facial lacerations, sutures should usually be taken out after 4-5 days to avoid stitch marks.   Wait a few days after your sutures are removed before applying any makeup. For Skin Adhesive Strips:  Keep the wound clean and dry.   Do not get the skin adhesive strips wet. You may bathe carefully, using caution to keep the wound dry.   If the wound gets wet, pat it dry with a clean towel.   Skin adhesive strips will fall off on their own. You may trim the strips as the wound heals. Do not remove skin adhesive strips that are still stuck to the wound. They will fall off in time.  For Wound Adhesive:  You may briefly wet your wound in the shower or bath. Do not soak or scrub the wound. Do not swim. Avoid periods of heavy sweating until the skin adhesive has fallen off on its own. After showering or bathing, gently pat the wound dry with a clean towel.   Do not apply liquid medicine, cream medicine, ointment medicine, or makeup to your wound while the skin adhesive is in place. This may loosen  the film before your wound is healed.   If a dressing is placed over the wound, be careful not to apply tape directly over the skin adhesive. This may cause the adhesive to be pulled off before the wound is healed.   Avoid prolonged exposure to sunlight or tanning lamps while the skin adhesive is in place.  The skin adhesive will usually remain in place for 5-10 days, then naturally fall off the skin. Do not pick at the adhesive film.  After Healing: Once the wound has healed, cover the wound with sunscreen during the day for 1  full year. This can help minimize scarring. Exposure to ultraviolet light in the first year will darken the scar. It can take 1-2 years for the scar to lose its redness and to heal completely.  SEEK IMMEDIATE MEDICAL CARE IF:  You have redness, pain, or swelling around the wound.   You see ayellowish-white fluid (pus) coming from the wound.   You have chills or a fever.  MAKE SURE YOU:  Understand these instructions.  Will watch your condition.  Will get help right away if you are not doing well or get worse. Document Released: 03/07/2004 Document Revised: 11/18/2012 Document Reviewed: 09/10/2012 [facility] Patient Information 2015 [place], [place]. This information is not intended to replace advice given to you by your health care provider. Make sure you discuss any questions you have with your health care provider.

## 2014-01-23 NOTE — Sedation Documentation (Signed)
Pt removed from O2.

## 2014-01-23 NOTE — Sedation Documentation (Signed)
Vital signs stable. 

## 2014-02-01 DIAGNOSIS — Z4802 Encounter for removal of sutures: Secondary | ICD-10-CM | POA: Diagnosis not present

## 2014-02-01 DIAGNOSIS — J440 Chronic obstructive pulmonary disease with acute lower respiratory infection: Secondary | ICD-10-CM | POA: Diagnosis not present

## 2014-03-15 DIAGNOSIS — F209 Schizophrenia, unspecified: Secondary | ICD-10-CM | POA: Diagnosis not present

## 2014-07-15 DIAGNOSIS — F209 Schizophrenia, unspecified: Secondary | ICD-10-CM | POA: Diagnosis not present

## 2014-11-09 DIAGNOSIS — F209 Schizophrenia, unspecified: Secondary | ICD-10-CM | POA: Diagnosis not present

## 2015-03-10 DIAGNOSIS — F209 Schizophrenia, unspecified: Secondary | ICD-10-CM | POA: Diagnosis not present

## 2015-03-15 ENCOUNTER — Emergency Department (HOSPITAL_COMMUNITY): Payer: Medicare Other

## 2015-03-15 ENCOUNTER — Encounter (HOSPITAL_COMMUNITY): Payer: Self-pay | Admitting: Emergency Medicine

## 2015-03-15 ENCOUNTER — Observation Stay (HOSPITAL_COMMUNITY)
Admission: EM | Admit: 2015-03-15 | Discharge: 2015-03-17 | Disposition: A | Discharge status: Home / Self Care | Payer: Medicare Other | Attending: Internal Medicine | Admitting: Internal Medicine

## 2015-03-15 DIAGNOSIS — R64 Cachexia: Secondary | ICD-10-CM

## 2015-03-15 DIAGNOSIS — I10 Essential (primary) hypertension: Secondary | ICD-10-CM | POA: Insufficient documentation

## 2015-03-15 DIAGNOSIS — R000 Tachycardia, unspecified: Secondary | ICD-10-CM | POA: Diagnosis not present

## 2015-03-15 DIAGNOSIS — R079 Chest pain, unspecified: Secondary | ICD-10-CM | POA: Diagnosis not present

## 2015-03-15 DIAGNOSIS — F209 Schizophrenia, unspecified: Secondary | ICD-10-CM | POA: Diagnosis present

## 2015-03-15 DIAGNOSIS — I160 Hypertensive urgency: Secondary | ICD-10-CM | POA: Diagnosis not present

## 2015-03-15 DIAGNOSIS — R072 Precordial pain: Secondary | ICD-10-CM

## 2015-03-15 DIAGNOSIS — F0390 Unspecified dementia without behavioral disturbance: Secondary | ICD-10-CM | POA: Diagnosis not present

## 2015-03-15 DIAGNOSIS — F17210 Nicotine dependence, cigarettes, uncomplicated: Secondary | ICD-10-CM | POA: Diagnosis not present

## 2015-03-15 DIAGNOSIS — Z79899 Other long term (current) drug therapy: Secondary | ICD-10-CM | POA: Diagnosis not present

## 2015-03-15 DIAGNOSIS — Z720 Tobacco use: Secondary | ICD-10-CM | POA: Diagnosis present

## 2015-03-15 DIAGNOSIS — F0391 Unspecified dementia with behavioral disturbance: Secondary | ICD-10-CM

## 2015-03-15 DIAGNOSIS — E43 Unspecified severe protein-calorie malnutrition: Secondary | ICD-10-CM | POA: Diagnosis not present

## 2015-03-15 LAB — CBC
HEMATOCRIT: 42.1 % (ref 36.0–46.0)
Hemoglobin: 14 g/dL (ref 12.0–15.0)
MCH: 29.7 pg (ref 26.0–34.0)
MCHC: 33.3 g/dL (ref 30.0–36.0)
MCV: 89.2 fL (ref 78.0–100.0)
Platelets: 214 10*3/uL (ref 150–400)
RBC: 4.72 MIL/uL (ref 3.87–5.11)
RDW: 13.7 % (ref 11.5–15.5)
WBC: 6 10*3/uL (ref 4.0–10.5)

## 2015-03-15 LAB — BASIC METABOLIC PANEL
Anion gap: 8 (ref 5–15)
BUN: 18 mg/dL (ref 6–20)
CHLORIDE: 105 mmol/L (ref 101–111)
CO2: 23 mmol/L (ref 22–32)
CREATININE: 0.86 mg/dL (ref 0.44–1.00)
Calcium: 8.4 mg/dL — ABNORMAL LOW (ref 8.9–10.3)
GFR calc Af Amer: >60 mL/min (ref >60)
GFR calc non Af Amer: >60 mL/min (ref >60)
GLUCOSE: 167 mg/dL — AB (ref 65–99)
Potassium: 4.2 mmol/L (ref 3.5–5.1)
SODIUM: 136 mmol/L (ref 135–145)

## 2015-03-15 LAB — TROPONIN I
Troponin I: <0.03 ng/mL (ref <0.031)
Troponin I: <0.03 ng/mL (ref <0.031)

## 2015-03-15 LAB — BRAIN NATRIURETIC PEPTIDE: B Natriuretic Peptide: 97 pg/mL (ref 0.0–100.0)

## 2015-03-15 LAB — I-STAT TROPONIN, ED: Troponin i, poc: 0.02 ng/mL (ref 0.00–0.08)

## 2015-03-15 MED ORDER — NITROGLYCERIN 0.4 MG SL SUBL: 0.4000 mg | SUBLINGUAL_TABLET | SUBLINGUAL | Status: DC | PRN

## 2015-03-15 MED ORDER — ZIPRASIDONE MESYLATE 20 MG IM SOLR
INTRAMUSCULAR | Status: AC
Start: 2015-03-15 — End: 2015-03-16
  Filled 2015-03-15: qty 20

## 2015-03-15 MED ORDER — PRAVASTATIN SODIUM 10 MG PO TABS
20.0000 mg | ORAL_TABLET | Freq: Every day | ORAL | Status: DC
  Administered 2015-03-15 – 2015-03-16 (×2): 20 mg via ORAL
  Filled 2015-03-15 (×3): qty 2

## 2015-03-15 MED ORDER — ENOXAPARIN SODIUM 30 MG/0.3ML ~~LOC~~ SOLN
30.0000 mg | SUBCUTANEOUS | Status: DC
  Administered 2015-03-16: 30 mg via SUBCUTANEOUS
  Filled 2015-03-15 (×2): qty 0.3

## 2015-03-15 MED ORDER — CLONIDINE HCL 0.1 MG PO TABS
0.1000 mg | ORAL_TABLET | Freq: Three times a day (TID) | ORAL | Status: DC
Start: 2015-03-15 — End: 2015-03-17
  Administered 2015-03-15 – 2015-03-17 (×5): 0.1 mg via ORAL
  Filled 2015-03-15 (×6): qty 1

## 2015-03-15 MED ORDER — METOPROLOL TARTRATE 25 MG PO TABS
25.0000 mg | ORAL_TABLET | Freq: Two times a day (BID) | ORAL | Status: DC
  Administered 2015-03-15: 25 mg via ORAL
  Filled 2015-03-15 (×2): qty 1

## 2015-03-15 MED ORDER — CLONIDINE HCL 0.1 MG PO TABS
0.1000 mg | ORAL_TABLET | ORAL | Status: AC
  Administered 2015-03-15: 0.1 mg via ORAL
  Filled 2015-03-15: qty 1

## 2015-03-15 MED ORDER — HALOPERIDOL LACTATE 5 MG/ML IJ SOLN
5.0000 mg | Freq: Four times a day (QID) | INTRAMUSCULAR | Status: DC | PRN
  Administered 2015-03-15 – 2015-03-16 (×2): 5 mg via INTRAVENOUS
  Filled 2015-03-15 (×2): qty 1

## 2015-03-15 MED ORDER — HYDRALAZINE HCL 10 MG PO TABS
10.0000 mg | ORAL_TABLET | Freq: Every day | ORAL | Status: DC
  Administered 2015-03-15: 10 mg via ORAL
  Filled 2015-03-15: qty 1

## 2015-03-15 MED ORDER — DONEPEZIL HCL 5 MG PO TABS
5.0000 mg | ORAL_TABLET | Freq: Every day | ORAL | Status: DC
Start: 2015-03-15 — End: 2015-03-17
  Administered 2015-03-15 – 2015-03-16 (×2): 5 mg via ORAL
  Filled 2015-03-15 (×2): qty 1

## 2015-03-15 MED ORDER — ZIPRASIDONE MESYLATE 20 MG IM SOLR
10.0000 mg | Freq: Once | INTRAMUSCULAR | Status: AC
  Administered 2015-03-15: 10 mg via INTRAMUSCULAR

## 2015-03-15 MED ORDER — STERILE WATER FOR INJECTION IJ SOLN
INTRAMUSCULAR | Status: AC
  Filled 2015-03-15: qty 10

## 2015-03-15 MED ORDER — MORPHINE SULFATE (PF) 2 MG/ML IV SOLN: 2.0000 mg | INTRAVENOUS | Status: DC | PRN

## 2015-03-15 MED ORDER — HALOPERIDOL 2 MG PO TABS
5.0000 mg | ORAL_TABLET | Freq: Every day | ORAL | Status: DC
  Administered 2015-03-16: 5 mg via ORAL
  Filled 2015-03-15: qty 3

## 2015-03-15 MED ORDER — LORAZEPAM 0.5 MG PO TABS: 0.5000 mg | ORAL_TABLET | Freq: Every day | ORAL | Status: DC | PRN

## 2015-03-15 MED ORDER — TRAZODONE HCL 50 MG PO TABS
200.0000 mg | ORAL_TABLET | Freq: Every evening | ORAL | Status: DC | PRN
  Administered 2015-03-15 – 2015-03-16 (×2): 200 mg via ORAL
  Filled 2015-03-15 (×2): qty 4

## 2015-03-15 MED ORDER — LORAZEPAM 2 MG/ML IJ SOLN
2.0000 mg | Freq: Once | INTRAMUSCULAR | Status: AC
  Administered 2015-03-15: 2 mg via INTRAVENOUS
  Filled 2015-03-15: qty 1

## 2015-03-15 MED ORDER — LORAZEPAM 2 MG/ML IJ SOLN
1.0000 mg | INTRAMUSCULAR | Status: DC | PRN
  Administered 2015-03-16 – 2015-03-17 (×3): 1 mg via INTRAVENOUS
  Filled 2015-03-15 (×4): qty 1

## 2015-03-15 MED ORDER — LORAZEPAM 2 MG/ML IJ SOLN
1.0000 mg | Freq: Once | INTRAMUSCULAR | Status: AC
  Administered 2015-03-15: 1 mg via INTRAVENOUS
  Filled 2015-03-15: qty 1

## 2015-03-15 MED ORDER — ONDANSETRON HCL 4 MG/2ML IJ SOLN: 4.0000 mg | Freq: Four times a day (QID) | INTRAMUSCULAR | Status: DC | PRN

## 2015-03-15 MED ORDER — ENOXAPARIN SODIUM 40 MG/0.4ML ~~LOC~~ SOLN: 40.0000 mg | SUBCUTANEOUS | Status: DC

## 2015-03-15 MED ORDER — NICOTINE 14 MG/24HR TD PT24
14.0000 mg | MEDICATED_PATCH | Freq: Every day | TRANSDERMAL | Status: DC
  Administered 2015-03-15 – 2015-03-16 (×2): 14 mg via TRANSDERMAL
  Filled 2015-03-15 (×3): qty 1

## 2015-03-15 MED ORDER — ASPIRIN EC 81 MG PO TBEC
81.0000 mg | DELAYED_RELEASE_TABLET | Freq: Every day | ORAL | Status: DC
  Administered 2015-03-16 – 2015-03-17 (×2): 81 mg via ORAL
  Filled 2015-03-15 (×2): qty 1

## 2015-03-15 MED ORDER — ACETAMINOPHEN 325 MG PO TABS
650.0000 mg | ORAL_TABLET | ORAL | Status: DC | PRN
  Administered 2015-03-16 (×2): 650 mg via ORAL
  Filled 2015-03-15 (×2): qty 2

## 2015-03-15 MED ORDER — ALUM & MAG HYDROXIDE-SIMETH 200-200-20 MG/5ML PO SUSP: 15.0000 mL | Freq: Four times a day (QID) | ORAL | Status: DC | PRN

## 2015-03-15 MED ORDER — HYDRALAZINE HCL 20 MG/ML IJ SOLN
10.0000 mg | Freq: Four times a day (QID) | INTRAMUSCULAR | Status: DC | PRN
  Administered 2015-03-16: 10 mg via INTRAVENOUS
  Filled 2015-03-15: qty 1

## 2015-03-15 MED ORDER — NICOTINE 7 MG/24HR TD PT24
7.0000 mg | MEDICATED_PATCH | Freq: Every day | TRANSDERMAL | Status: DC
  Filled 2015-03-15 (×3): qty 1

## 2015-03-15 MED ORDER — ASPIRIN 81 MG PO CHEW
324.0000 mg | CHEWABLE_TABLET | Freq: Once | ORAL | Status: AC
  Administered 2015-03-15: 324 mg via ORAL
  Filled 2015-03-15: qty 4

## 2015-03-15 MED ORDER — MIRTAZAPINE 30 MG PO TABS
30.0000 mg | ORAL_TABLET | Freq: Every day | ORAL | Status: DC
Start: 2015-03-15 — End: 2015-03-17
  Administered 2015-03-15 – 2015-03-16 (×2): 30 mg via ORAL
  Filled 2015-03-15 (×2): qty 1

## 2015-03-15 NOTE — ED Provider Notes (Signed)
CSN: [id number]     Arrival date & time 03/15/15  20 History   First MD Initiated Contact with Patient 03/15/15 1635     Chief Complaint  Patient presents with  . Chest Pain     (Consider location/radiation/quality/duration/timing/severity/associated sxs/prior Treatment) HPI Comments: 70 year old female who has a history of bipolar disorder, schizophrenia and dementia, she is also treated for hypertension. She lives with her daughter, she reports that today the patient started to complain of some chest pain, the patient is unable to answer any other questions, she is an extremely poor historian, she is competitive and scratching people trying to get out of the bed. She does not give any other useful information. The daughter reports that she is otherwise been herself until today, there has been no diarrhea, no fever, no nausea vomiting or diarrhea, no coughing, difficulty breathing, swelling and has had a normal appetite. She reports that she has no known cardiac disease in the past. She has not been seen in the hospital for a couple of years based on electronic medical record review  Patient is a 70 y.o. female presenting with chest pain. The history is provided by the patient.  Chest Pain   Past Medical History  Diagnosis Date  . Hypertension   . Schizophrenia ([place])   . Dementia    Past Surgical History  Procedure Laterality Date  . Ortho procedure     No family history on file. Social History  Substance Use Topics  . Smoking status: Current Every Day Smoker -- 0.50 packs/day    Types: Cigarettes  . Smokeless tobacco: None  . Alcohol Use: No   OB History    No data available     Review of Systems  Unable to perform ROS: Dementia  Cardiovascular: Positive for chest pain.      Allergies  Review of patient's allergies indicates no known allergies.  Home Medications   Prior to Admission medications   Medication Sig Start Date End Date Taking? Authorizing Provider   cloNIDine (CATAPRES) 0.1 MG tablet Take 0.1 mg by mouth 3 (three) times daily.     Yes Historical Provider, MD  donepezil (ARICEPT) 5 MG tablet Take 5 mg by mouth at bedtime.  02/16/15  Yes Historical Provider, MD  haloperidol (HALDOL) 5 MG tablet Take 5 mg by mouth at bedtime.  02/16/15  Yes Historical Provider, MD  hydrALAZINE (APRESOLINE) 10 MG tablet Take 10 mg by mouth at bedtime.    Yes Historical Provider, MD  LORazepam (ATIVAN) 0.5 MG tablet Take 0.5 mg by mouth daily as needed for anxiety. For anixety   Yes Historical Provider, MD  mirtazapine (REMERON) 30 MG tablet Take 30 mg by mouth at bedtime.     Yes Historical Provider, MD  pravastatin (PRAVACHOL) 20 MG tablet Take 20 mg by mouth daily.   Yes Historical Provider, MD  traZODone (DESYREL) 100 MG tablet Take 200 mg by mouth at bedtime as needed for sleep.   Yes Historical Provider, MD   BP 146/86 mmHg  Pulse 90  Temp(Src) 99 F (37.2 C) (Axillary)  Resp 19  Ht '5\' 5"'$  (1.651 m)  Wt 88 lb (39.917 kg)  BMI 14.64 kg/m2  SpO2 99% Physical Exam  Constitutional: She appears well-developed and well-nourished. No distress.  HENT:  Head: Normocephalic and atraumatic.  Mouth/Throat: Oropharynx is clear and moist. No oropharyngeal exudate.  Eyes: Conjunctivae and EOM are normal. Pupils are equal, round, and reactive to light. Right eye exhibits no discharge.  Left eye exhibits no discharge. No scleral icterus.  Neck: Normal range of motion. Neck supple. No JVD present. No thyromegaly present.  Cardiovascular: Normal heart sounds and intact distal pulses.  Exam reveals no gallop and no friction rub.   No murmur heard. The patient has a tachycardic rhythm, it is regularly irregular with what appears to be a trigeminy, there is not regular P waves, she has normal pulses, no edema, no JVD  Pulmonary/Chest: Effort normal and breath sounds normal. No respiratory distress. She has no wheezes. She has no rales.  Abdominal: Soft. Bowel sounds are  normal. She exhibits no distension and no mass. There is no tenderness.  Musculoskeletal: Normal range of motion. She exhibits no edema or tenderness.  Lymphadenopathy:    She has no cervical adenopathy.  Neurological: She is alert. Coordination normal.  Skin: Skin is warm and dry. No rash noted. No erythema.  Psychiatric: She has a normal mood and affect. Her behavior is normal.  Nursing note and vitals reviewed.   ED Course  Procedures (including critical care time) Labs Review Labs Reviewed  BASIC METABOLIC PANEL - Abnormal; Notable for the following:    Glucose, Bld 167 (*)    Calcium 8.4 (*)    All other components within normal limits  CBC  TROPONIN I  BRAIN NATRIURETIC PEPTIDE  I-STAT TROPOININ, ED    Imaging Review Dg Chest Portable 1 View  03/15/2015  CLINICAL DATA:  Pt c/o of mid/Lt sided, non radiating CP today. Denies any other symptoms at this time. Patient is unable to answer questions. Combative. EXAM: PORTABLE CHEST 1 VIEW COMPARISON:  12/01/2013 FINDINGS: Heart size accentuated by the technique. Lungs are clear. The patient's chin obscures the upper portion of the chest. No evidence for pulmonary edema. IMPRESSION: No evidence for acute cardiopulmonary abnormality. Electronically Signed   By: Nolon Nations M.D.   On: 03/15/2015 17:23   I have personally reviewed and evaluated these images and lab results as part of my medical decision-making.   EKG Interpretation   Date/Time:  Wednesday March 15 2015 18:19:19 EST Ventricular Rate:  89 PR Interval:  133 QRS Duration: 88 QT Interval:  361 QTC Calculation: 439 R Axis:   42 Text Interpretation:  Sinus rhythm Repol abnrm suggests ischemia, lateral  leads Borderline ST elevation, anterior leads Left ventricular hypertrophy  Since last tracing rate slower Confirmed by Javarie Crisp  MD, Deniese Oberry ([id number]) on  03/15/2015 7:07:30 PM        EKG Interpretation  Date/Time:  Wednesday March 15 2015 18:19:19  EST Ventricular Rate:  89 PR Interval:  133 QRS Duration: 88 QT Interval:  361 QTC Calculation: 439 R Axis:   42 Text Interpretation:  Sinus rhythm Repol abnrm suggests ischemia, lateral leads Borderline ST elevation, anterior leads Left ventricular hypertrophy Since last tracing rate slower Confirmed by Diesha Rostad  MD, Ngan Qualls ([id number]) on 03/15/2015 7:07:30 PM        MDM   Final diagnoses:  Chest pain, unspecified chest pain type    Vital signs show that the patient is mildly tachypneic, tachycardic, severely hypertensive though these vital signs were obtained while the patient was combative in her initial evaluation by nursing. We'll give the patient some Geodon, chest x-ray, labs, aspirin, no signs of ST elevation MI on the initial EKG  Troponin neg CXR neg Labs reassuring Sedation and clonidine given for BP - much improved Will admit to hosptialist Dr. Clementeen Graham to admit  Noemi Chapel, MD 03/15/15 Einar Crow

## 2015-03-15 NOTE — ED Notes (Signed)
Daughter reports pt has been c/o chest pain for the past couple of hours.  Pt combative with staff.  Daughter says pt is schizophrenic and has not had her night time medication for the past 3 or 4 nights.  Daughter says she accidentally forgot her night time meds.

## 2015-03-15 NOTE — ED Notes (Signed)
Pt eating, daughter assisting pt.  Pt calmer at this time.

## 2015-03-15 NOTE — H&P (Addendum)
[facility] History and Physical  NOREEN MACKINTOSH VQM:[id number] DOB: Jun 22, 1945 DOA: 03/15/2015  Referring physician: Dr. Sabra Heck PCP: Maggie Font, MD   Chief Complaint:  Chest pain since one day  HPI:  70 year old female with schizophrenia and severe dementia, hypertension brought to the ED by her daughter after she complained of left-sided chest pain this afternoon. Patient is demented and unable to provide any further history. As per her daughter at bedside patient was in her usual state of health and complaint of left-sided chest pain this afternoon that lasted for a while. She did not complain of any shortness of breath, nausea, vomiting, headache, dizziness. She did not have any fever or chills, abdominal pain, bowel or urinary symptoms. Her reports that patient was staying with her sister for about 4 days and return home 2 days back. She was not getting her antipsychotic medications for last few days (since daughter was forgetting to give her at bedtime). However she was getting her blood pressure medications as scheduled. Daughter denies any recent illness. Patient has not had any chest pain symptoms in the past or  any cardiac workup done. At baseline patient is able to carry out a few conversation, able to ambulate short distances in the house, feeding herself but does need help with using the bathroom and changing her clothes.  In the ED, patient was tachycardic up to 130s, blood pressure elevated to 252/139 mmHg. Patient was also very agitated. She was given full dose aspirin and 0.1 mg clonidine followed by one of them IV Ativan and 10 mg IV Geodon after which she calmed down and blood pressure improved. Blood work was unremarkable with negative troponin. EKG showed sinus rhythm with T-wave inversion in lateral leads which seemed unchanged from prior EKGs. Chest x-ray was unremarkable. Hospitalist admission requested for observation to rule out ACS.  Review of Systems:  As  outlined in history of present illness. Full review of systems Limited as patient is quite demented and cannot provide any history.  Past Medical History  Diagnosis Date  . Hypertension   . Schizophrenia ([place])   . Dementia    Past Surgical History  Procedure Laterality Date  . Ortho procedure     Social History:  reports that she has been smoking Cigarettes.  She has been smoking about 0.50 packs per day. She does not have any smokeless tobacco history on file. She reports that she does not drink alcohol or use illicit drugs.  No Known Allergies  Family history No family history of heart disease, diabetes or heart attack.  Prior to Admission medications   Medication Sig Start Date End Date Taking? Authorizing Provider  cloNIDine (CATAPRES) 0.1 MG tablet Take 0.1 mg by mouth 3 (three) times daily.     Yes Historical Provider, MD  donepezil (ARICEPT) 5 MG tablet Take 5 mg by mouth at bedtime.  02/16/15  Yes Historical Provider, MD  haloperidol (HALDOL) 5 MG tablet Take 5 mg by mouth at bedtime.  02/16/15  Yes Historical Provider, MD  hydrALAZINE (APRESOLINE) 10 MG tablet Take 10 mg by mouth at bedtime.    Yes Historical Provider, MD  LORazepam (ATIVAN) 0.5 MG tablet Take 0.5 mg by mouth daily as needed for anxiety. For anixety   Yes Historical Provider, MD  mirtazapine (REMERON) 30 MG tablet Take 30 mg by mouth at bedtime.     Yes Historical Provider, MD  pravastatin (PRAVACHOL) 20 MG tablet Take 20 mg by mouth daily.   Yes  Historical Provider, MD  traZODone (DESYREL) 100 MG tablet Take 200 mg by mouth at bedtime as needed for sleep.   Yes Historical Provider, MD     Physical Exam:  Filed Vitals:   03/15/15 1800 03/15/15 1821 03/15/15 1830 03/15/15 1900  BP: 136/79 124/73 120/81 146/86  Pulse: 91 90    Temp:      TempSrc:      Resp: '26 18 23 19  '$ Height:      Weight:      SpO2: 99% 99%      Constitutional: Vital signs reviewed.  Elderly cachectic female not in distress HEENT:  Temporal wasting, no pallor, no icterus, moist oral mucosa, no cervical lymphadenopathy Cardiovascular: RRR, S1 normal, S2 normal, no MRG Chest: CTAB, no wheezes, rales, or rhonchi Abdominal: Soft. Non-tender, non-distended, bowel sounds are normal,  Ext: warm, no edema Neurological: Alert and awake, not oriented, nonfocal  Labs on Admission:  Basic Metabolic Panel:  Recent Labs Lab 03/15/15 1730  NA 136  K 4.2  CL 105  CO2 23  GLUCOSE 167*  BUN 18  CREATININE 0.86  CALCIUM 8.4*   Liver Function Tests: No results for input(s): AST, ALT, ALKPHOS, BILITOT, PROT, ALBUMIN in the last 168 hours. No results for input(s): LIPASE, AMYLASE in the last 168 hours. No results for input(s): AMMONIA in the last 168 hours. CBC:  Recent Labs Lab 03/15/15 1652  WBC 6.0  HGB 14.0  HCT 42.1  MCV 89.2  PLT 214   Cardiac Enzymes:  Recent Labs Lab 03/15/15 1730  TROPONINI <0.03   BNP: Invalid input(s): POCBNP CBG: No results for input(s): GLUCAP in the last 168 hours.  Radiological Exams on Admission: Dg Chest Portable 1 View  03/15/2015  CLINICAL DATA:  Pt c/o of mid/Lt sided, non radiating CP today. Denies any other symptoms at this time. Patient is unable to answer questions. Combative. EXAM: PORTABLE CHEST 1 VIEW COMPARISON:  12/01/2013 FINDINGS: Heart size accentuated by the technique. Lungs are clear. The patient's chin obscures the upper portion of the chest. No evidence for pulmonary edema. IMPRESSION: No evidence for acute cardiopulmonary abnormality. Electronically Signed   By: Nolon Nations M.D.   On: 03/15/2015 17:23    EKG: Normal sinus rhythm with T-wave inversion in lateral leads (unchanged from prior EKG)  Assessment/Plan  Principal Problem:   Chest pain Difficult to obtain history from the patient established if chest pain is truly cardiac. Heart score is 3-4. Will admit under observation to telemetry. Cycle serial troponins. EKG in a.m. Aspirin 81 mg daily,  sublingual nitroglycerin when necessary with when necessary IV morphine for severe pain. -Add metoprolol 25 mg twice a day. Resume home blood pressure medications. Check lipid panel in the morning. -2-D echo to evaluate EF and any wall motion abnormality. Continue statin. -Given her underlying dementia and active schizophrenia I don't think she would tolerate Lexiscan Myoview at this time.  Active Problems:   Hypertensive urgency Daughter reports that patient has not missed her medications. Patient was quite agitated in the ED. Will resume her home blood pressure medications. Added metoprolol. Will order when necessary hydralazine as well.    Schizophrenia ([place]) Continue home dose Haldol and trazodone. Added when necessary IV Haldol for agitation.    Tobacco abuse Will place on nicotine patch    Dementia Continue Aricept and Remeron     Protein-calorie malnutrition, severe (HCC) with cachexia Dietitian consulted      Diet:cardiac  DVT prophylaxis: sq lovenox  Code Status: Full code Family Communication: discussed with daughter at bedside Disposition Plan: To telemetry.  Louellen Molder [facility] Pager [phone]  Total time spent on admission :50 minutes  If 7PM-7AM, please contact night-coverage [email or web address] Password [facility] 03/15/2015, 7:33 PM

## 2015-03-15 NOTE — ED Notes (Signed)
Pt c/o of mid/Lt sided, non radiating CP. Denies any other symptoms at this time. Pt poor historian and will refuse to answer questions unless asked by family. Pt also combative. Pt attempting to hit/scratch. Pt hx of Schizophrenia and Bi-polar.

## 2015-03-16 ENCOUNTER — Observation Stay (HOSPITAL_BASED_OUTPATIENT_CLINIC_OR_DEPARTMENT_OTHER): Payer: Medicare Other

## 2015-03-16 DIAGNOSIS — R079 Chest pain, unspecified: Secondary | ICD-10-CM

## 2015-03-16 DIAGNOSIS — F2089 Other schizophrenia: Secondary | ICD-10-CM | POA: Diagnosis not present

## 2015-03-16 DIAGNOSIS — I10 Essential (primary) hypertension: Secondary | ICD-10-CM | POA: Diagnosis not present

## 2015-03-16 DIAGNOSIS — Z720 Tobacco use: Secondary | ICD-10-CM

## 2015-03-16 DIAGNOSIS — F0390 Unspecified dementia without behavioral disturbance: Secondary | ICD-10-CM | POA: Diagnosis not present

## 2015-03-16 DIAGNOSIS — I160 Hypertensive urgency: Secondary | ICD-10-CM | POA: Diagnosis not present

## 2015-03-16 DIAGNOSIS — F17210 Nicotine dependence, cigarettes, uncomplicated: Secondary | ICD-10-CM | POA: Diagnosis not present

## 2015-03-16 LAB — TROPONIN I
Troponin I: <0.03 ng/mL (ref <0.031)
Troponin I: <0.03 ng/mL (ref <0.031)

## 2015-03-16 LAB — LIPID PANEL
CHOL/HDL RATIO: 2.5 ratio
CHOLESTEROL: 140 mg/dL (ref 0–200)
HDL: 57 mg/dL (ref >40)
LDL Cholesterol: 69 mg/dL (ref 0–99)
Triglycerides: 71 mg/dL (ref <150)
VLDL: 14 mg/dL (ref 0–40)

## 2015-03-16 MED ORDER — MINOXIDIL 2.5 MG PO TABS
5.0000 mg | ORAL_TABLET | Freq: Every day | ORAL | Status: DC
  Administered 2015-03-16: 5 mg via ORAL
  Filled 2015-03-16 (×2): qty 2

## 2015-03-16 MED ORDER — MINOXIDIL 2.5 MG PO TABS
5.0000 mg | ORAL_TABLET | Freq: Two times a day (BID) | ORAL | Status: DC
  Administered 2015-03-16 – 2015-03-17 (×2): 5 mg via ORAL
  Filled 2015-03-16 (×9): qty 2

## 2015-03-16 NOTE — Progress Notes (Signed)
Initial Nutrition Assessment  DOCUMENTATION CODES:   Underweight  INTERVENTION:   Provide Ensure Enlive po TID, each supplement will provide 350 kcals and 20 grams of protein. Provide patient with mechanically soft meats at meals.  Provide patient with high calorie, high protein snacks between meals.   NUTRITION DIAGNOSIS:   Increased nutrient needs related to chronic illness, other (see comment) (hyperactivity related to chronic illness (schizophrenia)) as evidenced by per patient/family report, estimated needs (family report of constant eating, always hungry).  GOAL:   Patient will meet greater than or equal to 90% of their needs  MONITOR:   PO intake, Weight trends, Supplement acceptance  REASON FOR ASSESSMENT:   Consult Assessment of nutrition requirement/status  ASSESSMENT:   70 year old female with schizophrenia and severe dementia, hypertension brought to the ED by her daughter after she complained of left-sided chest pain this afternoon. Patient is demented and unable to provide any further history. As per her daughter at bedside patient was in her usual state of health and complaint of left-sided chest pain this afternoon that lasted for a while. She did not complain of any shortness of breath, nausea, vomiting, headache, dizziness. She did not have any fever or chills, abdominal pain, bowel or urinary symptoms. Daughter denies any recent illness. At baseline patient is able to carry out a few conversation, able to ambulate short distances in the house, feeding herself but does need help with using the bathroom and changing her clothes.  Met with patient and daughter at bedside.  Patient was very active as she was moving around in her bed and trying to get up.  Pt lives with daughter or with the pt's sister.  Daughter reports that the patient has a great appetite. She stated that she may not look like it, but she eats everything. Daughter said if her or her children are  eating then the pt wants something to eat, even if she just finished a meal/snack.  Daughter reports that pt ate toast and a banana for breakfast.  She requested that the patient receives oatmeal and a banana for breakfast because it is easier for her to eat since she does not have any teeth.  Daughter said that pt ate all of her potatoes and green beans at lunch.  She ate some of the chicken, but daughter stated that patient will hold the meats in her mouth and then spit them out.  Offered to order mechanical soft meats so meats would be bite size and easier for the patient to chew.  Daughter was fine with this.  Daughter said that her mother enjoys eating most anything including peanut butter, bananas, and ice cream.  She states that she has never heard the pt say she was full or turn down food.  No meal completion records were noted upon chart review.   Unable to do the Nutrition Focused Physical Exam due to the activity of the patient and daughter suggestion since pt was slightly agitated at the time of visit. Upon visual assessment, the pt is very thin with minimal muscle or fat on body with visible bony prominences.  Daughter reports that this is the patients usual weight/size.  Patient is hyperactive and has elevated needs. RD suspects that even though the patient is reported to eat well per family, with her activity level she may still not be meeting her high needs.   To provide extra calories and protein, RD will order Ensure Enlive po TID, high calorie/high protein snacks BID, and  downgrade meats to mechanical soft (chopped) so that they are easier for her to consume.    Diet Order:  Diet Heart Room service appropriate?: Yes; Fluid consistency:: Thin  Skin:  Reviewed, no issues  Last BM:  PTA  Height:   Ht Readings from Last 1 Encounters:  03/15/15 _0  (1.727 m)   Weight:   Wt Readings from Last 1 Encounters:  03/15/15 95 lb 9.6 oz (43.364 kg)   Ideal Body Weight:  63.6 kg  BMI:   Body mass index is 14.54 kg/(m^2).  Estimated Nutritional Needs:   Kcal:  1600-1800  Protein:  60-70 grams   Fluid:  1.6 - 1.8 L  EDUCATION NEEDS:   No education needs identified at this time  Veronda Prude, Dietetic Intern Pager: [phone]

## 2015-03-16 NOTE — Progress Notes (Signed)
[facility] PROGRESS NOTE  Brittney Wall VQM:[id number] DOB: [birth year] DOA: 03/15/2015 PCP: Maggie Font, MD  HPI/Brief narrative Please see admit h and p from 2/1 for details. Briefly, 70yo with hx of schizophrenia and htn who presented to the ED initially with concerns for chest pains, found to have initial presenting blood pressure of 252/139 with heart rate into the 130s. Patient was admitted for further workup of her presenting chest pain and for management of hypertensive urgency.  Assessment/Plan:  Chest pain Difficult to obtain history from the patient established if chest pain is truly cardiac. Heart score is 3-4. Serial troponins have been negative 3.. EKG in a.m. patient is continued on aspirin 81 mg daily, sublingual nitroglycerin when necessary with when necessary IV morphine for severe pain. -Add metoprolol 25 mg twice a day as heart rate tolerates. Resume home blood pressure medications. Check lipid panel in the morning. -2-D echo to evaluate EF and any wall motion abnormality. Continue statin. -Given her underlying dementia and active schizophrenia I don't think she would tolerate Lexiscan Myoview at this time.  Active Problems:  Hypertensive urgency Daughter reports that patient has not missed her medications. Patient was quite agitated in the ED. Will resume her home blood pressure medications. Added metoprolol, however the patient is noted to be bradycardic thus doses have been held. Will continue when necessary hydralazine as well.  -Blood pressure this morning remains markedly elevated with systolic blood pressures into the mid 200s. We'll discontinue evening hydralazine, instead will start the patient on minoxidil   Schizophrenia ([place]) Continue home dose Haldol and trazodone. Added when necessary IV Haldol for agitation.   Tobacco abuse Will place on nicotine patch   Dementia Continue Aricept and Remeron    Protein-calorie malnutrition, severe ([place])  with cachexia Dietitian consulted  Code Status: Full Family Communication: Pt in room, family at bedside Disposition Plan: home when bp normalizes   Consultants:    Procedures:    Antibiotics: Anti-infectives    None      HPI/Subjective: No complaints  Objective: Filed Vitals:   03/16/15 0815 03/16/15 0906 03/16/15 0933 03/16/15 1024  BP: 211/87 200/95 201/117 219/74  Pulse: 54     Temp:      TempSrc:      Resp: 18     Height:      Weight:      SpO2: 99%      No intake or output data in the 24 hours ending 03/16/15 1406 Filed Weights   03/15/15 1651 03/15/15 2030  Weight: 39.917 kg (88 lb) 43.364 kg (95 lb 9.6 oz)    Exam:   General:  Awake, in nad  Cardiovascular: regular, s1, s2  Respiratory: normal resp effort, no wheezing  Abdomen: soft,nondistended  Musculoskeletal: perfused, no clubbing   Data Reviewed: Basic Metabolic Panel:  Recent Labs Lab 03/15/15 1730  NA 136  K 4.2  CL 105  CO2 23  GLUCOSE 167*  BUN 18  CREATININE 0.86  CALCIUM 8.4*   Liver Function Tests: No results for input(s): AST, ALT, ALKPHOS, BILITOT, PROT, ALBUMIN in the last 168 hours. No results for input(s): LIPASE, AMYLASE in the last 168 hours. No results for input(s): AMMONIA in the last 168 hours. CBC:  Recent Labs Lab 03/15/15 1652  WBC 6.0  HGB 14.0  HCT 42.1  MCV 89.2  PLT 214   Cardiac Enzymes:  Recent Labs Lab 03/15/15 1730 03/15/15 2034 03/16/15 0242 03/16/15 0844  TROPONINI <0.03 <0.03 <0.03 <0.03  BNP (last 3 results)  Recent Labs  03/15/15 1730  BNP 97.0    ProBNP (last 3 results) No results for input(s): PROBNP in the last 8760 hours.  CBG: No results for input(s): GLUCAP in the last 168 hours.  No results found for this or any previous visit (from the past 240 hour(s)).   Studies: Dg Chest Portable 1 View  03/15/2015  CLINICAL DATA:  Pt c/o of mid/Lt sided, non radiating CP today. Denies any other symptoms at this  time. Patient is unable to answer questions. Combative. EXAM: PORTABLE CHEST 1 VIEW COMPARISON:  12/01/2013 FINDINGS: Heart size accentuated by the technique. Lungs are clear. The patient's chin obscures the upper portion of the chest. No evidence for pulmonary edema. IMPRESSION: No evidence for acute cardiopulmonary abnormality. Electronically Signed   By: Nolon Nations M.D.   On: 03/15/2015 17:23    Scheduled Meds: . aspirin EC  81 mg Oral Daily  . cloNIDine  0.1 mg Oral TID  . donepezil  5 mg Oral QHS  . enoxaparin (LOVENOX) injection  30 mg Subcutaneous Q24H  . haloperidol  5 mg Oral QHS  . metoprolol tartrate  25 mg Oral BID  . minoxidil  5 mg Oral Daily  . mirtazapine  30 mg Oral QHS  . nicotine  14 mg Transdermal Daily  . pravastatin  20 mg Oral q1800   Continuous Infusions:   Principal Problem:   Chest pain Active Problems:   Hypertensive urgency   Schizophrenia (HCC)   Tobacco abuse   Dementia   Protein-calorie malnutrition, severe (HCC)   Chest pain at rest   Galen Russman, [facility] Pager [phone]. If 7PM-7AM, please contact night-coverage at [email or web address], password [facility] 03/16/2015, 2:06 PM

## 2015-03-16 NOTE — Care Management Obs Status (Signed)
[facility] NOTIFICATION   Patient Details  Name: Brittney Wall MRN: [id number] Date of Birth: 10/19/1945   Medicare Observation Status Notification Given:  Yes Explained to  and signed by daughter    Alvie Heidelberg, RN 03/16/2015, 8:46 AM

## 2015-03-16 NOTE — Progress Notes (Signed)
Patient blood pressure systolic in the 158'N, Dr. Wyline Copas notified that clonidine was given but metoprolol was held. Will give dose of IV hydralazine at this time also.

## 2015-03-16 NOTE — Progress Notes (Signed)
PT Cancellation Note  Patient Details Name: Brittney Wall MRN: [id number] DOB: 09-30-1945   Cancelled Treatment:    Reason Eval/Treat Not Completed: Patient not medically ready. Chart reviewed, RN consulted. Attempted to see pt, however last vitals check revealing HTN (DBP: 111 mmHg) outside of safe range for PT eval. Will continue to follow and evaluate at later date/time as appropriate.    9:51 AM, 03/16/2015 Etta Grandchild, PT, DPT PRN Physical Therapist at [place] License # [id number] [phone] (wireless)  [phone] (mobile)

## 2015-03-17 DIAGNOSIS — R079 Chest pain, unspecified: Secondary | ICD-10-CM | POA: Diagnosis not present

## 2015-03-17 DIAGNOSIS — E43 Unspecified severe protein-calorie malnutrition: Secondary | ICD-10-CM | POA: Diagnosis not present

## 2015-03-17 DIAGNOSIS — Z720 Tobacco use: Secondary | ICD-10-CM | POA: Diagnosis not present

## 2015-03-17 DIAGNOSIS — I160 Hypertensive urgency: Secondary | ICD-10-CM | POA: Diagnosis not present

## 2015-03-17 LAB — COMPREHENSIVE METABOLIC PANEL
ALK PHOS: 61 U/L (ref 38–126)
ALT: 13 U/L — AB (ref 14–54)
AST: 19 U/L (ref 15–41)
Albumin: 3.1 g/dL — ABNORMAL LOW (ref 3.5–5.0)
Anion gap: 5 (ref 5–15)
BUN: 13 mg/dL (ref 6–20)
CALCIUM: 8.7 mg/dL — AB (ref 8.9–10.3)
CHLORIDE: 108 mmol/L (ref 101–111)
CO2: 27 mmol/L (ref 22–32)
CREATININE: 0.74 mg/dL (ref 0.44–1.00)
Glucose, Bld: 120 mg/dL — ABNORMAL HIGH (ref 65–99)
Potassium: 4.6 mmol/L (ref 3.5–5.1)
Sodium: 140 mmol/L (ref 135–145)
Total Bilirubin: 0.3 mg/dL (ref 0.3–1.2)
Total Protein: 6 g/dL — ABNORMAL LOW (ref 6.5–8.1)

## 2015-03-17 LAB — CBC
HCT: 39.3 % (ref 36.0–46.0)
HEMOGLOBIN: 12.9 g/dL (ref 12.0–15.0)
MCH: 29 pg (ref 26.0–34.0)
MCHC: 32.8 g/dL (ref 30.0–36.0)
MCV: 88.3 fL (ref 78.0–100.0)
PLATELETS: 182 10*3/uL (ref 150–400)
RBC: 4.45 MIL/uL (ref 3.87–5.11)
RDW: 13.7 % (ref 11.5–15.5)
WBC: 4.8 10*3/uL (ref 4.0–10.5)

## 2015-03-17 MED ORDER — MINOXIDIL 2.5 MG PO TABS: 5.0000 mg | ORAL_TABLET | Freq: Two times a day (BID) | ORAL | Status: DC

## 2015-03-17 NOTE — Progress Notes (Signed)
Late Discharge note:  Patient discharged with instructions, prescription, and care notes.  Verbalized understanding via teach back.  IV was removed and the site was WNL. Patient voiced no further complaints or concerns at the time of discharge.  Appointments scheduled per instructions.  Patient left the floor via w/c with staff and family in stable condition.   The patient left her coat and the daughter said she would come back and pick up the coat.  The coat is at the desk labeled.

## 2015-03-17 NOTE — Progress Notes (Signed)
PT Cancellation Note  Patient Details Name: Brittney Wall MRN: [id number] DOB: 05-Jun-1945   Cancelled Treatment:    Reason Eval/Treat Not Completed: Other (comment).  Pt is very agitated, flailing about in the bed stating that she wants to go home. CNA present.  She is unable to participate in PT eval.   Based on notes in the chart, pt lives with her daughter and is total care with most ADLs, able to ambulate just short distances.  Her BP this AM is WNL.  Once home, she should  return to her prior functional baseline.   Demetrios Isaacs L  PT 03/17/2015, 9:40 AM [phone]

## 2015-03-17 NOTE — Discharge Summary (Signed)
Physician Discharge Summary  Brittney Wall [id number]:[id number] DOB: February 16, 1945 DOA: 03/15/2015  PCP: Maggie Font, MD  Admit date: 03/15/2015 Discharge date: 03/17/2015  Time spent: 20 minutes  Recommendations for Outpatient Follow-up:  1. Follow up with PCP in 1-2 weeks 2. Please note that home hydralazine was replaced with minoxidil on discharge, please titrate blood pressure meds accordingly 3. Please re-evaluate need for scheduled ativan on discharge  Discharge Diagnoses:  Principal Problem:   Chest pain Active Problems:   Hypertensive urgency   Schizophrenia ([place])   Tobacco abuse   Dementia   Protein-calorie malnutrition, severe ([place])   Chest pain at rest   Discharge Condition: Improved  Diet recommendation: Heart healthy  Filed Weights   03/15/15 1651 03/15/15 2030  Weight: 39.917 kg (88 lb) 43.364 kg (95 lb 9.6 oz)    History of present illness:  Please see admit h and p from 2/1 for details. Briefly, 70yo with hx of schizophrenia and htn who presented to the ED initially with concerns for chest pains, found to have initial presenting blood pressure of 252/139 with heart rate into the 130s. Patient was admitted for further workup of her presenting chest pain and for management of hypertensive urgency.  Hospital Course:   Chest pain Difficult to obtain history from the patient established if chest pain is truly cardiac. Heart score is 3-4. Serial troponins have been negative 3.. Patient was continued on aspirin 81 mg daily while admitted -Resumed home blood pressure medications. -2-D echo was without wall motion abnormality. However, wall thickness was increased in a pattern of LVH. Continued statin.   Hypertensive urgency Daughter reports that patient has not missed her medications. Patient was quite agitated in the ED. Resumed her home blood pressure medications. Blood pressures remained markedly elevated with systolic blood pressures into the mid 200s. Discontinued  home dose of evening hydralazine, instead started the patient on minoxidil. BP on discharge much improved at 122/55   Schizophrenia ([place]) Continued home dose Haldol and trazodone. Added when necessary IV Haldol for agitation.   Tobacco abuse Will place on nicotine patch   Dementia Continue Aricept and Remeron   Protein-calorie malnutrition, severe ([place]) with cachexia Dietitian was consulted   Discharge Exam: Filed Vitals:   03/17/15 0616 03/17/15 0733 03/17/15 1220 03/17/15 1345  BP: 154/60  122/55 128/58  Pulse: 61  64 77  Temp: 97.4 F (36.3 C)  97.6 F (36.4 C)   TempSrc: Axillary  Axillary   Resp: 18  20   Height:      Weight:      SpO2: 100% 98% 98%     General: Awake, in nad Cardiovascular: regular, s1, s2 Respiratory: normal resp effort, no wheezing  Discharge Instructions     Medication List    STOP taking these medications        hydrALAZINE 10 MG tablet  Commonly known as:  APRESOLINE      TAKE these medications        cloNIDine 0.1 MG tablet  Commonly known as:  CATAPRES  Take 0.1 mg by mouth 3 (three) times daily.     donepezil 5 MG tablet  Commonly known as:  ARICEPT  Take 5 mg by mouth at bedtime.     haloperidol 5 MG tablet  Commonly known as:  HALDOL  Take 5 mg by mouth at bedtime.     LORazepam 0.5 MG tablet  Commonly known as:  ATIVAN  Take 0.5 mg by mouth daily as needed  for anxiety. For anixety     minoxidil 2.5 MG tablet  Commonly known as:  LONITEN  Take 2 tablets (5 mg total) by mouth 2 (two) times daily.     mirtazapine 30 MG tablet  Commonly known as:  REMERON  Take 30 mg by mouth at bedtime.     pravastatin 20 MG tablet  Commonly known as:  PRAVACHOL  Take 20 mg by mouth daily.     traZODone 100 MG tablet  Commonly known as:  DESYREL  Take 200 mg by mouth at bedtime as needed for sleep.       No Known Allergies Follow-up Information    Follow up with Maggie Font, MD. Schedule an appointment as soon as  possible for a visit in 1 week.   Specialty:  Family Medicine   Why:  Hospital follow up   Contact information:   [place] [id number] [phone]        The results of significant diagnostics from this hospitalization (including imaging, microbiology, ancillary and laboratory) are listed below for reference.    Significant Diagnostic Studies: Dg Chest Portable 1 View  03/15/2015  CLINICAL DATA:  Pt c/o of mid/Lt sided, non radiating CP today. Denies any other symptoms at this time. Patient is unable to answer questions. Combative. EXAM: PORTABLE CHEST 1 VIEW COMPARISON:  12/01/2013 FINDINGS: Heart size accentuated by the technique. Lungs are clear. The patient's chin obscures the upper portion of the chest. No evidence for pulmonary edema. IMPRESSION: No evidence for acute cardiopulmonary abnormality. Electronically Signed   By: Nolon Nations M.D.   On: 03/15/2015 17:23    Microbiology: No results found for this or any previous visit (from the past 240 hour(s)).   Labs: Basic Metabolic Panel:  Recent Labs Lab 03/15/15 1730 03/17/15 0621  NA 136 140  K 4.2 4.6  CL 105 108  CO2 23 27  GLUCOSE 167* 120*  BUN 18 13  CREATININE 0.86 0.74  CALCIUM 8.4* 8.7*   Liver Function Tests:  Recent Labs Lab 03/17/15 0621  AST 19  ALT 13*  ALKPHOS 61  BILITOT 0.3  PROT 6.0*  ALBUMIN 3.1*   No results for input(s): LIPASE, AMYLASE in the last 168 hours. No results for input(s): AMMONIA in the last 168 hours. CBC:  Recent Labs Lab 03/15/15 1652 03/17/15 0621  WBC 6.0 4.8  HGB 14.0 12.9  HCT 42.1 39.3  MCV 89.2 88.3  PLT 214 182   Cardiac Enzymes:  Recent Labs Lab 03/15/15 1730 03/15/15 2034 03/16/15 0242 03/16/15 0844  TROPONINI <0.03 <0.03 <0.03 <0.03   BNP: BNP (last 3 results)  Recent Labs  03/15/15 1730  BNP 97.0    ProBNP (last 3 results) No results for input(s): PROBNP in the last 8760 hours.  CBG: No results for  input(s): GLUCAP in the last 168 hours.   Signed:  CHIU, Orpah Melter  [facility] 03/17/2015, 2:18 PM

## 2015-03-17 NOTE — Progress Notes (Signed)
   03/17/15 1345  Vitals  BP (!) 128/58 mmHg  BP Location Right Arm  BP Method Automatic  Patient Position (if appropriate) Lying  Pulse Rate 77  Pulse Rate Source Dinamap   Notified Dr. Wyline Copas via text.  Discussed the patients plan of care with the patients daughter.  She inquired if the patient had an U/A since the admission.  Looked at the results and told her that the test had been done and it appeared to be negative.  I also discussed with her the BP medications that is currently ordered for the patient.  She inquired if the medications would be ordered at discharged.  I told her it was up with the MD.  The daughter is also aware of the patients BP and HR.

## 2015-03-29 DIAGNOSIS — I739 Peripheral vascular disease, unspecified: Secondary | ICD-10-CM | POA: Diagnosis not present

## 2015-03-29 DIAGNOSIS — L110 Acquired keratosis follicularis: Secondary | ICD-10-CM | POA: Diagnosis not present

## 2015-03-29 DIAGNOSIS — B351 Tinea unguium: Secondary | ICD-10-CM | POA: Diagnosis not present

## 2015-04-03 DIAGNOSIS — I10 Essential (primary) hypertension: Secondary | ICD-10-CM | POA: Diagnosis not present

## 2015-04-03 DIAGNOSIS — F209 Schizophrenia, unspecified: Secondary | ICD-10-CM | POA: Diagnosis not present

## 2015-04-03 DIAGNOSIS — J439 Emphysema, unspecified: Secondary | ICD-10-CM | POA: Diagnosis not present

## 2015-06-17 ENCOUNTER — Emergency Department (HOSPITAL_COMMUNITY): Payer: Medicare Other

## 2015-06-17 ENCOUNTER — Emergency Department (HOSPITAL_COMMUNITY)
Admission: EM | Admit: 2015-06-17 | Discharge: 2015-06-17 | Disposition: A | Discharge status: Home / Self Care | Payer: Medicare Other | Attending: Emergency Medicine | Admitting: Emergency Medicine

## 2015-06-17 ENCOUNTER — Encounter (HOSPITAL_COMMUNITY): Payer: Self-pay | Admitting: Emergency Medicine

## 2015-06-17 DIAGNOSIS — F209 Schizophrenia, unspecified: Secondary | ICD-10-CM | POA: Diagnosis not present

## 2015-06-17 DIAGNOSIS — Y929 Unspecified place or not applicable: Secondary | ICD-10-CM | POA: Diagnosis not present

## 2015-06-17 DIAGNOSIS — Y999 Unspecified external cause status: Secondary | ICD-10-CM | POA: Diagnosis not present

## 2015-06-17 DIAGNOSIS — S0512XA Contusion of eyeball and orbital tissues, left eye, initial encounter: Secondary | ICD-10-CM | POA: Insufficient documentation

## 2015-06-17 DIAGNOSIS — S0990XA Unspecified injury of head, initial encounter: Secondary | ICD-10-CM

## 2015-06-17 DIAGNOSIS — S199XXA Unspecified injury of neck, initial encounter: Secondary | ICD-10-CM | POA: Diagnosis not present

## 2015-06-17 DIAGNOSIS — H1132 Conjunctival hemorrhage, left eye: Secondary | ICD-10-CM

## 2015-06-17 DIAGNOSIS — S0181XA Laceration without foreign body of other part of head, initial encounter: Secondary | ICD-10-CM | POA: Diagnosis not present

## 2015-06-17 DIAGNOSIS — S098XXA Other specified injuries of head, initial encounter: Secondary | ICD-10-CM | POA: Diagnosis not present

## 2015-06-17 DIAGNOSIS — W19XXXA Unspecified fall, initial encounter: Secondary | ICD-10-CM | POA: Insufficient documentation

## 2015-06-17 DIAGNOSIS — Z87891 Personal history of nicotine dependence: Secondary | ICD-10-CM | POA: Insufficient documentation

## 2015-06-17 DIAGNOSIS — I10 Essential (primary) hypertension: Secondary | ICD-10-CM | POA: Diagnosis not present

## 2015-06-17 DIAGNOSIS — Y939 Activity, unspecified: Secondary | ICD-10-CM | POA: Diagnosis not present

## 2015-06-17 MED ORDER — LIDOCAINE-EPINEPHRINE (PF) 1 %-1:200000 IJ SOLN
20.0000 mL | Freq: Once | INTRAMUSCULAR | Status: AC
  Administered 2015-06-17: 20 mL via INTRADERMAL
  Filled 2015-06-17 (×2): qty 30

## 2015-06-17 MED ORDER — LORAZEPAM 2 MG/ML IJ SOLN
2.0000 mg | Freq: Once | INTRAMUSCULAR | Status: AC
  Administered 2015-06-17: 2 mg via INTRAMUSCULAR
  Filled 2015-06-17: qty 1

## 2015-06-17 MED ORDER — STERILE WATER FOR INJECTION IJ SOLN
10.0000 mL | Freq: Once | INTRAMUSCULAR | Status: AC
  Administered 2015-06-17: 1.2 mL via INTRAMUSCULAR
  Filled 2015-06-17: qty 10

## 2015-06-17 MED ORDER — LORAZEPAM 0.5 MG PO TABS
0.5000 mg | ORAL_TABLET | Freq: Once | ORAL | Status: AC
  Administered 2015-06-17: 0.5 mg via ORAL
  Filled 2015-06-17: qty 1

## 2015-06-17 MED ORDER — ZIPRASIDONE MESYLATE 20 MG IM SOLR
10.0000 mg | Freq: Once | INTRAMUSCULAR | Status: AC
  Administered 2015-06-17: 10 mg via INTRAMUSCULAR
  Filled 2015-06-17: qty 20

## 2015-06-17 MED ORDER — HALOPERIDOL 5 MG PO TABS
5.0000 mg | ORAL_TABLET | Freq: Once | ORAL | Status: AC
  Administered 2015-06-17: 5 mg via ORAL
  Filled 2015-06-17: qty 1

## 2015-06-17 MED ORDER — LIDOCAINE-EPINEPHRINE (PF) 1 %-1:200000 IJ SOLN: 10.0000 mL | Freq: Once | INTRAMUSCULAR | Status: DC

## 2015-06-17 MED ORDER — DOUBLE ANTIBIOTIC 500-10000 UNIT/GM EX OINT
TOPICAL_OINTMENT | Freq: Once | CUTANEOUS | Status: AC
  Administered 2015-06-17: 11:00:00 via TOPICAL
  Filled 2015-06-17: qty 3

## 2015-06-17 NOTE — ED Notes (Signed)
Pt is schizophrenic as well as bipolar- tonight family heard her in her room- fall  Now with a 1 inch laceration to her let eyebrow- family denies LOC- report that she hit the corner of the table

## 2015-06-17 NOTE — ED Provider Notes (Signed)
CSN: [id number]     Arrival date & time 06/17/15  0617 History   First MD Initiated Contact with Patient 06/17/15 0703     Chief Complaint  Patient presents with  . Fall     (Consider location/radiation/quality/duration/timing/severity/associated sxs/prior Treatment) HPI Patient presents as an unwitnessed fall. Lives with family. Daughter states she heard a loud noise coming from her room. This was roughly 1-2 hours prior to presentation. She walked into the room and found the patient had sustained laceration to her left forehead. No loss of consciousness. No seizure-like activity. She suspects the patient struck the table. Patient's been at her baseline mental status since. She has history of schizophrenia, bipolar and advanced dementia. She is unable to contribute history. Unknown last tetanus. Past Medical History  Diagnosis Date  . Hypertension   . Schizophrenia ([place])   . Dementia    Past Surgical History  Procedure Laterality Date  . Ortho procedure     No family history on file. Social History  Substance Use Topics  . Smoking status: Former Smoker -- 0.50 packs/day    Types: Cigarettes  . Smokeless tobacco: None  . Alcohol Use: No   OB History    No data available     Review of Systems  Unable to perform ROS: Dementia      Allergies  Review of patient's allergies indicates no known allergies.  Home Medications   Prior to Admission medications   Medication Sig Start Date End Date Taking? Authorizing Provider  cloNIDine (CATAPRES) 0.1 MG tablet Take 0.1 mg by mouth 3 (three) times daily.      Historical Provider, MD  donepezil (ARICEPT) 5 MG tablet Take 5 mg by mouth at bedtime.  02/16/15   Historical Provider, MD  haloperidol (HALDOL) 5 MG tablet Take 5 mg by mouth at bedtime.  02/16/15   Historical Provider, MD  LORazepam (ATIVAN) 0.5 MG tablet Take 0.5 mg by mouth daily as needed for anxiety. For anixety    Historical Provider, MD  minoxidil (LONITEN) 2.5 MG  tablet Take 2 tablets (5 mg total) by mouth 2 (two) times daily. 03/17/15   Donne Hazel, MD  mirtazapine (REMERON) 30 MG tablet Take 30 mg by mouth at bedtime.      Historical Provider, MD  pravastatin (PRAVACHOL) 20 MG tablet Take 20 mg by mouth daily.    Historical Provider, MD  traZODone (DESYREL) 100 MG tablet Take 200 mg by mouth at bedtime as needed for sleep.    Historical Provider, MD   BP 207/89 mmHg  Pulse 81  Temp(Src) 97 F (36.1 C) (Axillary)  Resp 16  Ht '5\' 8"'$  (1.727 m)  Wt 95 lb (43.092 kg)  BMI 14.45 kg/m2  SpO2 100% Physical Exam  Constitutional: She appears well-developed and well-nourished. No distress.  HENT:  Head: Normocephalic.  Mouth/Throat: Oropharynx is clear and moist.  3 cm laceration to the left forehead just superior to the left eyebrow. There is a full thickness. No active bleeding. No gross contamination. Midface is stable.  Eyes: EOM are normal. Pupils are equal, round, and reactive to light.  Subconjunctival hematoma to the left globe. No hyphema  Neck: Normal range of motion. Neck supple.  No midline cervical tenderness  Cardiovascular: Normal rate and regular rhythm.  Exam reveals no gallop and no friction rub.   No murmur heard. Pulmonary/Chest: Effort normal and breath sounds normal. No respiratory distress. She has no wheezes. She has no rales.  Abdominal: Soft. Bowel  sounds are normal. She exhibits no distension and no mass. There is no tenderness. There is no rebound and no guarding.  Musculoskeletal: Normal range of motion. She exhibits no edema or tenderness.  Neurological:  Patient is uncooperative with exam. She appears to be moving all of her extremities. She's not answering questions or following commands. Daughter states this is her baseline.  Skin: Skin is warm and dry. No rash noted. No erythema.  Nursing note and vitals reviewed.   ED Course  .Marland KitchenLaceration Repair Date/Time: 06/17/2015 11:18 AM Performed by: Julianne Rice Authorized by: Lita Mains, Lular Letson Consent given by: guardian Body area: head/neck Location details: forehead Laceration length: 4 cm Foreign bodies: no foreign bodies Tendon involvement: none Nerve involvement: none Vascular damage: no Local anesthetic: lidocaine 1% with epinephrine Anesthetic total: 2 ml Patient sedated: yes Sedation type: anxiolysis Sedatives: haloperidol, lorazepam and see MAR for details Vitals: Vital signs were monitored during sedation. Preparation: Patient was prepped and draped in the usual sterile fashion. Irrigation solution: saline Irrigation method: syringe Amount of cleaning: standard Debridement: none Degree of undermining: none Subcutaneous closure: 4-0 Vicryl Number of sutures: 4 Technique: simple Approximation: close Approximation difficulty: simple Dressing: antibiotic ointment and 4x4 sterile gauze Patient tolerance: Patient tolerated the procedure well with no immediate complications   (including critical care time) Labs Review Labs Reviewed - No data to display  Imaging Review Ct Head Wo Contrast  06/17/2015  CLINICAL DATA:  Fall with left supraorbital laceration. Initial encounter. EXAM: CT HEAD WITHOUT CONTRAST CT CERVICAL SPINE WITHOUT CONTRAST TECHNIQUE: Multidetector CT imaging of the head and cervical spine was performed following the standard protocol without intravenous contrast. Multiplanar CT image reconstructions of the cervical spine were also generated. COMPARISON:  01/23/2014 FINDINGS: CT HEAD FINDINGS Stable small vessel ischemic changes in the periventricular white matter. The brain demonstrates no evidence of hemorrhage, infarction, edema, mass effect, extra-axial fluid collection, hydrocephalus or mass lesion. The skull is unremarkable. CT CERVICAL SPINE FINDINGS The cervical spine shows normal alignment. There is no evidence of acute fracture or subluxation. No soft tissue swelling or hematoma is identified. Stable  spondylosis, predominantly at C4-5, C5-6 and C6-7. Stable appearance of multinodular thyroid goiter. No bony or soft tissue lesions are seen. The visualized airway is normally patent. IMPRESSION: 1. 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 No acute findings by head CT. 2. Stable spondylosis of the cervical spine. Stable multinodular thyroid goiter. Electronically Signed   By: Aletta Edouard M.D.   On: 06/17/2015 08:00   Ct Cervical Spine Wo Contrast  06/17/2015  CLINICAL DATA:  Fall with left supraorbital laceration. Initial encounter. EXAM: CT HEAD WITHOUT CONTRAST CT CERVICAL SPINE WITHOUT CONTRAST TECHNIQUE: Multidetector CT imaging of the head and cervical spine was performed following the standard protocol without intravenous contrast. Multiplanar CT image reconstructions of the cervical spine were also generated. COMPARISON:  01/23/2014 FINDINGS: CT HEAD FINDINGS Stable small vessel ischemic changes in the periventricular white matter. The brain demonstrates no evidence of hemorrhage, infarction, edema, mass effect, extra-axial fluid collection, hydrocephalus or mass lesion. The skull is unremarkable. CT CERVICAL SPINE FINDINGS The cervical spine shows normal alignment. There is no evidence of acute fracture or subluxation. No soft tissue swelling or hematoma is identified. Stable spondylosis, predominantly at C4-5, C5-6 and C6-7. Stable appearance of multinodular thyroid goiter. No bony or soft tissue lesions are seen. The visualized airway is normally patent. IMPRESSION: 1. 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 No acute findings by head CT. 2.  Stable spondylosis of the cervical spine. Stable multinodular thyroid goiter. Electronically Signed   By: Aletta Edouard M.D.   On: 06/17/2015 08:00   I have personally reviewed and evaluated these images and lab results as part of my medical decision-making.   EKG Interpretation None      MDM   Final diagnoses:  Closed head injury, initial  encounter  Forehead laceration, initial encounter  Subconjunctival hematoma, left     Tetanus given 04/04/13 per ER records.  Patient continues to be combative despite multiple antipsychotic/sedatives. CT head and cervical spine without any acute findings.  Vicryl sutures were placed to avoid need for sedation for suture removal. Head injury precautions have been given to the patient's daughter as well as laceration care instructions. Understands need to follow-up with primary physician regarding blood pressure and wound recheck.  Julianne Rice, MD 06/17/15 1121

## 2015-06-17 NOTE — Discharge Instructions (Signed)
Facial Laceration ° A facial laceration is a cut on the face. These injuries can be painful and cause bleeding. Lacerations usually heal quickly, but they need special care to reduce scarring. °DIAGNOSIS  °Your health care provider will take a medical history, ask for details about how the injury occurred, and examine the wound to determine how deep the cut is. °TREATMENT  °Some facial lacerations may not require closure. Others may not be able to be closed because of an increased risk of infection. The risk of infection and the chance for successful closure will depend on various factors, including the amount of time since the injury occurred. °The wound may be cleaned to help prevent infection. If closure is appropriate, pain medicines may be given if needed. Your health care provider will use stitches (sutures), wound glue (adhesive), or skin adhesive strips to repair the laceration. These tools bring the skin edges together to allow for faster healing and a better cosmetic outcome. If needed, you may also be given a tetanus shot. °HOME CARE INSTRUCTIONS °· Only take over-the-counter or prescription medicines as directed by your health care provider. °· Follow your health care provider's instructions for wound care. These instructions will vary depending on the technique used for closing the wound. °For Sutures: °· Keep the wound clean and dry.   °· If you were given a bandage (dressing), you should change it at least once a day. Also change the dressing if it becomes wet or dirty, or as directed by your health care provider.   °· Wash the wound with soap and water 2 times a day. Rinse the wound off with water to remove all soap. Pat the wound dry with a clean towel.   °· After cleaning, apply a thin layer of the antibiotic ointment recommended by your health care provider. This will help prevent infection and keep the dressing from sticking.   °· You may shower as usual after the first 24 hours. Do not soak the  wound in water until the sutures are removed.   °· Get your sutures removed as directed by your health care provider. With facial lacerations, sutures should usually be taken out after 4-5 days to avoid stitch marks.   °· Wait a few days after your sutures are removed before applying any makeup. °For Skin Adhesive Strips: °· Keep the wound clean and dry.   °· Do not get the skin adhesive strips wet. You may bathe carefully, using caution to keep the wound dry.   °· If the wound gets wet, pat it dry with a clean towel.   °· Skin adhesive strips will fall off on their own. You may trim the strips as the wound heals. Do not remove skin adhesive strips that are still stuck to the wound. They will fall off in time.   °For Wound Adhesive: °· You may briefly wet your wound in the shower or bath. Do not soak or scrub the wound. Do not swim. Avoid periods of heavy sweating until the skin adhesive has fallen off on its own. After showering or bathing, gently pat the wound dry with a clean towel.   °· Do not apply liquid medicine, cream medicine, ointment medicine, or makeup to your wound while the skin adhesive is in place. This may loosen the film before your wound is healed.   °· If a dressing is placed over the wound, be careful not to apply tape directly over the skin adhesive. This may cause the adhesive to be pulled off before the wound is healed.   °· Avoid   prolonged exposure to sunlight or tanning lamps while the skin adhesive is in place.  The skin adhesive will usually remain in place for 5-10 days, then naturally fall off the skin. Do not pick at the adhesive film.  After Healing: Once the wound has healed, cover the wound with sunscreen during the day for 1 full year. This can help minimize scarring. Exposure to ultraviolet light in the first year will darken the scar. It can take 1-2 years for the scar to lose its redness and to heal completely.  SEEK MEDICAL CARE IF:  You have a fever. SEEK IMMEDIATE  MEDICAL CARE IF:  You have redness, pain, or swelling around the wound.   You see ayellowish-white fluid (pus) coming from the wound.    This information is not intended to replace advice given to you by your health care provider. Make sure you discuss any questions you have with your health care provider.   Document Released: 03/07/2004 Document Revised: 02/18/2014 Document Reviewed: 09/10/2012 Elsevier Interactive Patient Education 2016 [place] Injury, Adult You have a head injury. Headaches and throwing up (vomiting) are common after a head injury. It should be easy to wake up from sleeping. Sometimes you must stay in the hospital. Most problems happen within the first 24 hours. Side effects may occur up to 7-10 days after the injury.  WHAT ARE THE TYPES OF HEAD INJURIES? Head injuries can be as minor as a bump. Some head injuries can be more severe. More severe head injuries include:  A jarring injury to the brain (concussion).  A bruise of the brain (contusion). This mean there is bleeding in the brain that can cause swelling.  A cracked skull (skull fracture).  Bleeding in the brain that collects, clots, and forms a bump (hematoma). WHEN SHOULD I GET HELP RIGHT AWAY?   You are confused or sleepy.  You cannot be woken up.  You feel sick to your stomach (nauseous) or keep throwing up (vomiting).  Your dizziness or unsteadiness is getting worse.  You have very bad, lasting headaches that are not helped by medicine. Take medicines only as told by your doctor.  You cannot use your arms or legs like normal.  You cannot walk.  You notice changes in the black spots in the center of the colored part of your eye (pupil).  You have clear or bloody fluid coming from your nose or ears.  You have trouble seeing. During the next 24 hours after the injury, you must stay with someone who can watch you. This person should get help right away (call 911 in the [place] if  you start to shake and are not able to control it (have seizures), you pass out, or you are unable to wake up. HOW CAN I PREVENT A HEAD INJURY IN THE FUTURE?  Wear seat belts.  Wear a helmet while bike riding and playing sports like football.  Stay away from dangerous activities around the house. WHEN CAN I RETURN TO NORMAL ACTIVITIES AND ATHLETICS? See your doctor before doing these activities. You should not do normal activities or play contact sports until 1 week after the following symptoms have stopped:  Headache that does not go away.  Dizziness.  Poor attention.  Confusion.  Memory problems.  Sickness to your stomach or throwing up.  Tiredness.  Fussiness.  Bothered by bright lights or loud noises.  Anxiousness or depression.  Restless sleep. MAKE SURE YOU:   Understand these instructions.  Will  watch your condition.  Will get help right away if you are not doing well or get worse.   This information is not intended to replace advice given to you by your health care provider. Make sure you discuss any questions you have with your health care provider.   Document Released: 01/11/2008 Document Revised: 02/18/2014 Document Reviewed: 10/05/2012 Elsevier Interactive Patient Education [employer].

## 2015-06-17 NOTE — ED Notes (Signed)
Pt remains combative-hitting/scratching when attempting to cleanse laceration.

## 2015-06-17 NOTE — ED Notes (Signed)
Pt remains agitated/combative, attempting to get out of bed, hitting/scratching. Daughter requests more sedation prior to attempting to suture pt's laceration. EDP notified.

## 2015-07-04 DIAGNOSIS — I10 Essential (primary) hypertension: Secondary | ICD-10-CM | POA: Diagnosis not present

## 2015-07-04 DIAGNOSIS — S01112A Laceration without foreign body of left eyelid and periocular area, initial encounter: Secondary | ICD-10-CM | POA: Diagnosis not present

## 2015-07-04 DIAGNOSIS — F209 Schizophrenia, unspecified: Secondary | ICD-10-CM | POA: Diagnosis not present

## 2015-07-04 DIAGNOSIS — J449 Chronic obstructive pulmonary disease, unspecified: Secondary | ICD-10-CM | POA: Diagnosis not present

## 2015-11-01 DIAGNOSIS — F209 Schizophrenia, unspecified: Secondary | ICD-10-CM | POA: Diagnosis not present

## 2016-01-10 DIAGNOSIS — I10 Essential (primary) hypertension: Secondary | ICD-10-CM | POA: Diagnosis not present

## 2016-01-10 DIAGNOSIS — R05 Cough: Secondary | ICD-10-CM | POA: Diagnosis not present

## 2016-01-10 DIAGNOSIS — J449 Chronic obstructive pulmonary disease, unspecified: Secondary | ICD-10-CM | POA: Diagnosis not present

## 2016-01-10 DIAGNOSIS — F209 Schizophrenia, unspecified: Secondary | ICD-10-CM | POA: Diagnosis not present

## 2016-03-29 DIAGNOSIS — F209 Schizophrenia, unspecified: Secondary | ICD-10-CM | POA: Diagnosis not present

## 2016-06-14 ENCOUNTER — Encounter (HOSPITAL_COMMUNITY): Payer: Self-pay | Admitting: Emergency Medicine

## 2016-06-14 ENCOUNTER — Emergency Department (HOSPITAL_COMMUNITY): Payer: Medicare Other

## 2016-06-14 ENCOUNTER — Emergency Department (HOSPITAL_COMMUNITY)
Admission: EM | Admit: 2016-06-14 | Discharge: 2016-06-14 | Disposition: A | Discharge status: Home / Self Care | Payer: Medicare Other | Attending: Emergency Medicine | Admitting: Emergency Medicine

## 2016-06-14 DIAGNOSIS — J90 Pleural effusion, not elsewhere classified: Secondary | ICD-10-CM | POA: Insufficient documentation

## 2016-06-14 DIAGNOSIS — R531 Weakness: Secondary | ICD-10-CM

## 2016-06-14 DIAGNOSIS — Z87891 Personal history of nicotine dependence: Secondary | ICD-10-CM | POA: Insufficient documentation

## 2016-06-14 DIAGNOSIS — R0689 Other abnormalities of breathing: Secondary | ICD-10-CM | POA: Diagnosis not present

## 2016-06-14 DIAGNOSIS — Z79899 Other long term (current) drug therapy: Secondary | ICD-10-CM | POA: Insufficient documentation

## 2016-06-14 DIAGNOSIS — R918 Other nonspecific abnormal finding of lung field: Secondary | ICD-10-CM | POA: Diagnosis not present

## 2016-06-14 DIAGNOSIS — R220 Localized swelling, mass and lump, head: Secondary | ICD-10-CM

## 2016-06-14 DIAGNOSIS — I10 Essential (primary) hypertension: Secondary | ICD-10-CM | POA: Diagnosis not present

## 2016-06-14 LAB — COMPREHENSIVE METABOLIC PANEL
ALBUMIN: 3.3 g/dL — AB (ref 3.5–5.0)
ALT: 19 U/L (ref 14–54)
ANION GAP: 7 (ref 5–15)
AST: 28 U/L (ref 15–41)
Alkaline Phosphatase: 53 U/L (ref 38–126)
BILIRUBIN TOTAL: 0.6 mg/dL (ref 0.3–1.2)
BUN: 15 mg/dL (ref 6–20)
CALCIUM: 8.3 mg/dL — AB (ref 8.9–10.3)
CO2: 26 mmol/L (ref 22–32)
Chloride: 105 mmol/L (ref 101–111)
Creatinine, Ser: 0.67 mg/dL (ref 0.44–1.00)
GFR calc non Af Amer: >60 mL/min (ref >60)
GLUCOSE: 117 mg/dL — AB (ref 65–99)
POTASSIUM: 3.9 mmol/L (ref 3.5–5.1)
SODIUM: 138 mmol/L (ref 135–145)
TOTAL PROTEIN: 6.3 g/dL — AB (ref 6.5–8.1)

## 2016-06-14 LAB — CBC WITH DIFFERENTIAL/PLATELET
BASOS ABS: 0 10*3/uL (ref 0.0–0.1)
BASOS PCT: 1 %
Eosinophils Absolute: 0 10*3/uL (ref 0.0–0.7)
Eosinophils Relative: 1 %
HEMATOCRIT: 35.6 % — AB (ref 36.0–46.0)
HEMOGLOBIN: 12.4 g/dL (ref 12.0–15.0)
LYMPHS PCT: 31 %
Lymphs Abs: 1.2 10*3/uL (ref 0.7–4.0)
MCH: 30.1 pg (ref 26.0–34.0)
MCHC: 34.8 g/dL (ref 30.0–36.0)
MCV: 86.4 fL (ref 78.0–100.0)
MONO ABS: 0.4 10*3/uL (ref 0.1–1.0)
Monocytes Relative: 10 %
NEUTROS ABS: 2.3 10*3/uL (ref 1.7–7.7)
NEUTROS PCT: 57 %
Platelets: 157 10*3/uL (ref 150–400)
RBC: 4.12 MIL/uL (ref 3.87–5.11)
RDW: 13.9 % (ref 11.5–15.5)
WBC: 4 10*3/uL (ref 4.0–10.5)

## 2016-06-14 LAB — BRAIN NATRIURETIC PEPTIDE: B Natriuretic Peptide: 188 pg/mL — ABNORMAL HIGH (ref 0.0–100.0)

## 2016-06-14 LAB — TROPONIN I

## 2016-06-14 MED ORDER — SODIUM CHLORIDE 0.9 % IV BOLUS (SEPSIS): 250.0000 mL | Freq: Once | INTRAVENOUS | Status: DC

## 2016-06-14 MED ORDER — SODIUM CHLORIDE 0.9 % IV SOLN: INTRAVENOUS | Status: DC

## 2016-06-14 NOTE — ED Notes (Signed)
Went in to obtain IV, patient drew back arm and stated "leave me alone." patient able to drink on her own with no difficulty. Dr Rogene Houston aware and verbal order to give patient drink. Gave patient ginger ale x 2 to drink and awaiting meal tray.

## 2016-06-14 NOTE — ED Notes (Signed)
Ordered meal tray for patient as requested and approved by Dr Rogene Houston.

## 2016-06-14 NOTE — ED Notes (Signed)
Family went to vending machine and pt got the hand sanitizer bottle out of her purse and drank one swallow of this.

## 2016-06-14 NOTE — ED Provider Notes (Signed)
[facility] DEPT Provider Note   CSN: [id number] Arrival date & time: 06/14/16  1309     History   Chief Complaint Chief Complaint  Patient presents with  . Facial Swelling    L eye swelling intermittent    HPI Brittney Wall is a 71 y.o. female.  She brought in by family members. Patient has a history of dementia and a history of schizophrenia. Patient is on Alzheimer medication. Patient also has a history of hypertension. Family is concerned about swelling to the face predominate around the left eye that comes and goes for the past few days. In swelling of both ankles which comes and goes. Patient's also had significant weight loss. In patient's appetite at home is not been good.      Past Medical History:  Diagnosis Date  . Dementia   . Hypertension   . Schizophrenia [facility])     Patient Active Problem List   Diagnosis Date Noted  . Chest pain 03/15/2015  . Hypertensive urgency 03/15/2015  . Schizophrenia ([place]) 03/15/2015  . Tobacco abuse 03/15/2015  . Dementia 03/15/2015  . Protein-calorie malnutrition, severe ([place]) 03/15/2015  . Chest pain at rest 03/15/2015    Past Surgical History:  Procedure Laterality Date  . ortho procedure      OB History    No data available       Home Medications    Prior to Admission medications   Medication Sig Start Date End Date Taking? Authorizing Provider  cloNIDine (CATAPRES) 0.1 MG tablet Take 0.1 mg by mouth 3 (three) times daily.     Yes Historical Provider, MD  donepezil (ARICEPT) 5 MG tablet Take 5 mg by mouth at bedtime.  02/16/15  Yes Historical Provider, MD  haloperidol (HALDOL) 5 MG tablet Take 5 mg by mouth at bedtime.  02/16/15  Yes Historical Provider, MD  hydrALAZINE (APRESOLINE) 10 MG tablet Take 10 mg by mouth at bedtime.  06/13/16  Yes Historical Provider, MD  megestrol (MEGACE) 40 MG/ML suspension Take 800 mg by mouth daily. For appetite   Yes Historical Provider, MD  minoxidil (LONITEN) 2.5 MG tablet Take  2 tablets (5 mg total) by mouth 2 (two) times daily. 03/17/15  Yes Donne Hazel, MD  mirtazapine (REMERON) 30 MG tablet Take 30 mg by mouth at bedtime.     Yes Historical Provider, MD  pravastatin (PRAVACHOL) 20 MG tablet Take 20 mg by mouth at bedtime.    Yes Historical Provider, MD  traZODone (DESYREL) 100 MG tablet Take 200 mg by mouth at bedtime as needed for sleep.   Yes Historical Provider, MD    Family History No family history on file.  Social History Social History  Substance Use Topics  . Smoking status: Former Smoker    Packs/day: 0.50    Types: Cigarettes  . Smokeless tobacco: Never Used  . Alcohol use No     Allergies   Patient has no known allergies.   Review of Systems Review of Systems  Unable to perform ROS: Dementia     Physical Exam Updated Vital Signs BP (!) 165/75 (BP Location: Left Arm)   Pulse 82   Resp 20   Ht '5\' 7"'$  (1.702 m)   Wt 40.8 kg   SpO2 99%   BMI 14.10 kg/m   Physical Exam  Constitutional: She appears well-developed and well-nourished. No distress.  HENT:  Head: Normocephalic and atraumatic.  Mouth/Throat: Oropharynx is clear and moist.  No facial swelling  Eyes: Conjunctivae  and EOM are normal. Pupils are equal, round, and reactive to light.  No swelling around the eyes  Neck: Neck supple.  Cardiovascular: Normal rate, regular rhythm and normal heart sounds.   Pulmonary/Chest: Breath sounds normal. No respiratory distress. She has no wheezes.  Abdominal: Soft. Bowel sounds are normal. There is no tenderness.  Musculoskeletal: Normal range of motion. She exhibits no edema.  Skin: Skin is warm.  Nursing note and vitals reviewed.    ED Treatments / Results  Labs (all labs ordered are listed, but only abnormal results are displayed) Labs Reviewed  COMPREHENSIVE METABOLIC PANEL - Abnormal; Notable for the following:       Result Value   Glucose, Bld 117 (*)    Calcium 8.3 (*)    Total Protein 6.3 (*)    Albumin 3.3 (*)      All other components within normal limits  CBC WITH DIFFERENTIAL/PLATELET - Abnormal; Notable for the following:    HCT 35.6 (*)    All other components within normal limits  BRAIN NATRIURETIC PEPTIDE - Abnormal; Notable for the following:    B Natriuretic Peptide 188.0 (*)    All other components within normal limits  TROPONIN I   Results for orders placed or performed during the hospital encounter of 06/14/16  Comprehensive metabolic panel  Result Value Ref Range   Sodium 138 135 - 145 mmol/L   Potassium 3.9 3.5 - 5.1 mmol/L   Chloride 105 101 - 111 mmol/L   CO2 26 22 - 32 mmol/L   Glucose, Bld 117 (H) 65 - 99 mg/dL   BUN 15 6 - 20 mg/dL   Creatinine, Ser 0.67 0.44 - 1.00 mg/dL   Calcium 8.3 (L) 8.9 - 10.3 mg/dL   Total Protein 6.3 (L) 6.5 - 8.1 g/dL   Albumin 3.3 (L) 3.5 - 5.0 g/dL   AST 28 15 - 41 U/L   ALT 19 14 - 54 U/L   Alkaline Phosphatase 53 38 - 126 U/L   Total Bilirubin 0.6 0.3 - 1.2 mg/dL   GFR calc non Af Amer >60 >60 mL/min   GFR calc Af Amer >60 >60 mL/min   Anion gap 7 5 - 15  CBC with Differential/Platelet  Result Value Ref Range   WBC 4.0 4.0 - 10.5 K/uL   RBC 4.12 3.87 - 5.11 MIL/uL   Hemoglobin 12.4 12.0 - 15.0 g/dL   HCT 35.6 (L) 36.0 - 46.0 %   MCV 86.4 78.0 - 100.0 fL   MCH 30.1 26.0 - 34.0 pg   MCHC 34.8 30.0 - 36.0 g/dL   RDW 13.9 11.5 - 15.5 %   Platelets 157 150 - 400 K/uL   Neutrophils Relative % 57 %   Neutro Abs 2.3 1.7 - 7.7 K/uL   Lymphocytes Relative 31 %   Lymphs Abs 1.2 0.7 - 4.0 K/uL   Monocytes Relative 10 %   Monocytes Absolute 0.4 0.1 - 1.0 K/uL   Eosinophils Relative 1 %   Eosinophils Absolute 0.0 0.0 - 0.7 K/uL   Basophils Relative 1 %   Basophils Absolute 0.0 0.0 - 0.1 K/uL  Brain natriuretic peptide  Result Value Ref Range   B Natriuretic Peptide 188.0 (H) 0.0 - 100.0 pg/mL  Troponin I  Result Value Ref Range   Troponin I <0.03 <0.03 ng/mL    EKG  EKG Interpretation None       Radiology Ct Chest Wo  Contrast  Result Date: 06/14/2016 CLINICAL DATA:  Weakness.  Evaluate for pulmonary edema or pneumonia. EXAM: CT CHEST WITHOUT CONTRAST TECHNIQUE: Multidetector CT imaging of the chest was performed following the standard protocol without IV contrast. COMPARISON:  06/14/2016 FINDINGS: According to study nodes: Patient was combative, and unable to follow direction or remain still for exam. Cardiovascular: Diminished exam detail due to lack of IV contrast material and motion artifact. There is mild cardiac enlargement. A small pericardial effusion may be present. Aortic atherosclerosis noted. Calcifications within the RCA, LAD and left circumflex coronary artery noted. Mediastinum/Nodes: The trachea appears patent and is midline. The esophagus is unremarkable. Within the limitations of unenhanced technique and diffuse motion artifact no bulky mediastinal or hilar adenopathy identified. No axillary adenopathy. Lungs/Pleura: Small pleural effusions are noted bilaterally left greater than right. Atelectasis is identified within the left lower lobe overlying the left pleural effusion. Upper Abdomen: No acute abnormality. Musculoskeletal: Age-indeterminate left lateral ninth rib fracture deformity is suspected. IMPRESSION: 1. Significantly diminished exam detail due to motion artifact and lack of IV contrast material. 2. A small pericardial effusion cannot be excluded due to motion artifact and lack of IV contrast material 3. Bilateral pleural effusions left greater right. 4.  Aortic Atherosclerosis (ICD10-I70.0). 5. Multi vessel coronary artery calcification 6. Left lateral ninth rib fracture is suspected but age indeterminate. Electronically Signed   By: Kerby Moors M.D.   On: 06/14/2016 21:14   Dg Chest Port 1 View  Result Date: 06/14/2016 CLINICAL DATA:  71 year old female with a history of weakness EXAM: PORTABLE CHEST 1 VIEW COMPARISON:  03/15/2015 FINDINGS: Interval enlargement of the cardiac silhouette.  Calcifications of the aortic arch. Coarsened interstitial markings with interlobular septal thickening. Blunting of the left costophrenic sulcus. No pneumothorax. No displaced fracture. IMPRESSION: Interval enlargement of the cardiac silhouette, compatible with development of pericardial effusion. Recommend correlation with cardiac echo if there is concern for compromise of cardiac output. Interlobular septal thickening with interstitial opacities suggesting developing pulmonary edema, however, infection cannot be excluded. Blunting of left costophrenic sulcus may reflect a small pleural effusion. Aortic atherosclerosis. Electronically Signed   By: Corrie Mckusick D.O.   On: 06/14/2016 17:09    Procedures Procedures (including critical care time)  Medications Ordered in ED Medications  0.9 %  sodium chloride infusion (not administered)  sodium chloride 0.9 % bolus 250 mL (not administered)     Initial Impression / Assessment and Plan / ED Course  I have reviewed the triage vital signs and the nursing notes.  Pertinent labs & imaging results that were available during my care of the patient were reviewed by me and considered in my medical decision making (see chart for details).   patient brought in by family members for concern for facial swelling to the left eye on and off since Tuesday and overall swelling in the ankle area. No swelling in any of those places currently. No evidence of any allergic conjunctivitis. No tongue or lip swelling. Patient in no distress. Family states sniffing and weight loss over a period of time in patient's appetite has been decreased but she ate very well here. Extensive workup here shows some bilateral pleural effusions without any hypoxia changes. Mild elevation in BNP but no evidence of pulmonary edema. No significant anemia no leukocytosis. Renal function is normal. Oxygenation is been fine. Patient had chest x-ray and also had CT of chest just to rule out any  pulmonary edema or pneumonia. Or whether there was a pericardial effusion. CT without evidence of any significant pericardial effusion.  Patient currently nontoxic no acute distress no significant swelling. Patient can follow-up with primary care doctor. Close follow-up will be important.  In addition patient's troponin was negative.    Final Clinical Impressions(s) / ED Diagnoses   Final diagnoses:  Facial swelling  Pleural effusion    New Prescriptions New Prescriptions   No medications on file     Fredia Sorrow, MD 06/14/16 2133

## 2016-06-14 NOTE — ED Triage Notes (Signed)
Informed of delay and assured will get into room asap

## 2016-06-14 NOTE — ED Notes (Signed)
Gave patient another drink as family requested.

## 2016-06-14 NOTE — ED Notes (Signed)
Per Lorrie at report-pt resistant to staff interventions due to dementia, EMD was notified unable to get IV access and unable to give fluids, oked by EMD

## 2016-06-14 NOTE — ED Triage Notes (Signed)
Swelling top L eye on and off since Tuesday  She is a pt of Dr Berdine Addison  On 2 HTN meds

## 2016-06-14 NOTE — ED Notes (Signed)
Gave patient meal tray. Patient's family member feeding patient at this time.

## 2016-06-14 NOTE — ED Notes (Signed)
Patient's family member requested this nurse to remove items such as O2 tubing and blood pressure cuffs, states "if you don't take it out of here, she will continue to keep trying to get it."

## 2016-06-14 NOTE — Discharge Instructions (Signed)
Workup today without indications for admission. There is evidence of some bilateral pleural effusions. No evidence of any significant fluid around the heart. Close follow-up with primary care doctor would be important. Also evaluation for the weight loss and poor appetite would be beneficial as well. Recommend close follow-up with primary care doctor.

## 2016-06-17 DIAGNOSIS — R7309 Other abnormal glucose: Secondary | ICD-10-CM | POA: Diagnosis not present

## 2016-06-17 DIAGNOSIS — F209 Schizophrenia, unspecified: Secondary | ICD-10-CM | POA: Diagnosis not present

## 2016-06-17 DIAGNOSIS — J449 Chronic obstructive pulmonary disease, unspecified: Secondary | ICD-10-CM | POA: Diagnosis not present

## 2016-06-17 DIAGNOSIS — R634 Abnormal weight loss: Secondary | ICD-10-CM | POA: Diagnosis not present

## 2016-06-17 DIAGNOSIS — I10 Essential (primary) hypertension: Secondary | ICD-10-CM | POA: Diagnosis not present

## 2016-06-17 DIAGNOSIS — I2510 Atherosclerotic heart disease of native coronary artery without angina pectoris: Secondary | ICD-10-CM | POA: Diagnosis not present

## 2016-06-26 DIAGNOSIS — R7309 Other abnormal glucose: Secondary | ICD-10-CM | POA: Diagnosis not present

## 2016-07-15 ENCOUNTER — Ambulatory Visit: Payer: Medicare Other | Admitting: Cardiovascular Disease

## 2016-07-15 ENCOUNTER — Encounter: Payer: Self-pay | Admitting: Cardiovascular Disease

## 2016-07-19 DIAGNOSIS — F209 Schizophrenia, unspecified: Secondary | ICD-10-CM | POA: Diagnosis not present

## 2016-07-29 ENCOUNTER — Encounter: Payer: Medicare Other | Attending: Family Medicine | Admitting: Nutrition

## 2016-07-29 VITALS — Wt 95.0 lb

## 2016-07-29 DIAGNOSIS — F3112 Bipolar disorder, current episode manic without psychotic features, moderate: Secondary | ICD-10-CM

## 2016-07-29 DIAGNOSIS — Z713 Dietary counseling and surveillance: Secondary | ICD-10-CM | POA: Insufficient documentation

## 2016-07-29 DIAGNOSIS — Z681 Body mass index (BMI) 19 or less, adult: Secondary | ICD-10-CM | POA: Insufficient documentation

## 2016-07-29 DIAGNOSIS — F250 Schizoaffective disorder, bipolar type: Secondary | ICD-10-CM

## 2016-07-29 DIAGNOSIS — R636 Underweight: Secondary | ICD-10-CM

## 2016-07-29 DIAGNOSIS — E119 Type 2 diabetes mellitus without complications: Secondary | ICD-10-CM

## 2016-07-29 DIAGNOSIS — R634 Abnormal weight loss: Secondary | ICD-10-CM | POA: Diagnosis not present

## 2016-07-29 DIAGNOSIS — E43 Unspecified severe protein-calorie malnutrition: Secondary | ICD-10-CM

## 2016-07-29 NOTE — Progress Notes (Signed)
Medical Nutrition Therapy:  Appt start time: 6195 end time:  1600.   Assessment:  Primary concerns today: Undeweight, Protein Calorie Malnutrition. Patient is brought in by her daughter, who cares for her. Pt.s daughter states that her mom eats a lot but is still losing weight. Has to watch salty foods due to HTN.  Pt. In a wheelchair. Pt. Is edentulous.. Pt. Slightly combative today and uncooperative, so visit was limited. Pt's daughter notes patient will eat anything that is put in front on her. She has a good appetite. Her daughter notes she walks a lot around the house and doesn't sit still for long periods of time. However, she has limited mobility.       Unsure of weight history. Past records show her weight flucuates around 90-95 lbs for the last year.    A1C 6.7%.  BS are most likely elevated due to drugs for her mental illnesses.  Wt Readings from Last 3 Encounters:  07/29/16 95 lb (43.1 kg)  06/14/16 90 lb (40.8 kg)  06/17/15 95 lb (43.1 kg)   Ht Readings from Last 3 Encounters:  06/14/16 5\' 7"  (1.702 m)  06/17/15 5\' 8"  (1.727 m)  03/15/15 5\' 8"  (1.727 m)   Body mass index is 14.88 kg/m. CMP Latest Ref Rng & Units 06/14/2016 03/17/2015 03/15/2015  Glucose 65 - 99 mg/dL 117(H) 120(H) 167(H)  BUN 6 - 20 mg/dL 15 13 18   Creatinine 0.44 - 1.00 mg/dL 0.67 0.74 0.86  Sodium 135 - 145 mmol/L 138 140 136  Potassium 3.5 - 5.1 mmol/L 3.9 4.6 4.2  Chloride 101 - 111 mmol/L 105 108 105  CO2 22 - 32 mmol/L 26 27 23   Calcium 8.9 - 10.3 mg/dL 8.3(L) 8.7(L) 8.4(L)  Total Protein 6.5 - 8.1 g/dL 6.3(L) 6.0(L) -  Total Bilirubin 0.3 - 1.2 mg/dL 0.6 0.3 -  Alkaline Phos 38 - 126 U/L 53 61 -  AST 15 - 41 U/L 28 19 -  ALT 14 - 54 U/L 19 13(L) -       Pt. Is very thin with bony structure. She has bitemporal wasting, sunken eye sockets, no body fat. Wears depends. No teeth but that doesn't interfere with her appetite according to her daughter.  PMH: HTN, Bipolar and mental disorders.     Pt's  daughter to increase calories with oral supplements and fat, protein, dairy for needed weight gain and improved nutrition.   Preferred Learning Style:   No preference indicated   Learning Readiness:  Not ready   Ready  Change in progress   MEDICATIONS: See list   DIETARY INTAKE:  Pt reported to eat 3 meals per day and snacks between. Will drink Equate brand of ensure when given. Drinks water. Good appteite.  Usual physical activity: ADL  Estimated energy needs: 1600-1800  calories 180 g carbohydrates 120 g protein 44 g fat  Progress Towards Goal(s):  In progress.   Nutritional Diagnosis:  NI-1.4 Inadequate energy intake As related to dementia and bipolar and mental disorders.  As evidenced by BM less than18..    Intervention:  Nutrition and Diabetes education provided on My Plate, CHO counting, meal planning, portion sizes, timing of meals, avoiding snacks between meals unless having a low blood sugar, target ranges for A1C and blood sugars, signs/symptoms and treatment of hyper/hypoglycemia, monitoring blood sugars, taking medications as prescribed, benefits of exercising 30 minutes per day and prevention of complications of DM.   Teaching Method Utilized:  Visual Auditory Hands on  Handouts given during visit include:  HIgh Calorie HIgh Protein Diet  High Calorie High Protein recipes  My Plate  Barriers to learning/adherence to lifestyle change: Mental illness.  Demonstrated degree of understanding via:  Teach Back   Monitoring/Evaluation:  Dietary intake, exercise, meal planning, ways to increase calories, and body weight in 1 month(s). Pt's daughter will call for follow up due to pt. Being combative in office due to unfamiliar atmosphere. Sees a mental health counselor already.

## 2016-07-29 NOTE — Progress Notes (Deleted)
Cardiology Office Note   Date:  07/29/2016   ID:  Brittney Wall, DOB 11/08/1945, MRN [id number]  PCP:  [name], MD  Cardiologist:   Jenkins Rouge, MD   No chief complaint on file.     History of Present Illness: Brittney Wall is a 71 y.o. female who presents for evaluation of edema. Referred by Dr Berdine Addison and [facility] Dr Rogene Houston She has schizophrenia, , Dementia and HTN. Reviewed ER notes from 06/14/16 and family brought patient in mostly for swelling over left eye. Patient was combative with poor quality CT no PE no pericardial effusion. I reviewed her CXR and she Had CE with cephalization and small left pleural effusion. No cardiac history . Family notes some LE edema  BNP only 188 troponin negative ECG no acute changes Albumin low at 3.3  Family also complained that her appetite was poor and she has lost weight   She did have an echo 03/16/15 done for chest pain reviewed EF 60-65% Focal basal hypertrophy moderate LVH  Grade one diastolic Mild MR Estimated PA 32 mmHg  Past Medical History:  Diagnosis Date  . Dementia   . Hypertension   . Schizophrenia [facility])     Past Surgical History:  Procedure Laterality Date  . ortho procedure       Current Outpatient Prescriptions  Medication Sig Dispense Refill  . cloNIDine (CATAPRES) 0.1 MG tablet Take 0.1 mg by mouth 3 (three) times daily.      Marland Kitchen donepezil (ARICEPT) 5 MG tablet Take 5 mg by mouth at bedtime.     . haloperidol (HALDOL) 5 MG tablet Take 5 mg by mouth at bedtime.     . hydrALAZINE (APRESOLINE) 10 MG tablet Take 10 mg by mouth at bedtime.     . megestrol (MEGACE) 40 MG/ML suspension Take 800 mg by mouth daily. For appetite    . minoxidil (LONITEN) 2.5 MG tablet Take 2 tablets (5 mg total) by mouth 2 (two) times daily. 60 tablet 0  . mirtazapine (REMERON) 30 MG tablet Take 30 mg by mouth at bedtime.      . pravastatin (PRAVACHOL) 20 MG tablet Take 20 mg by mouth at bedtime.     . traZODone (DESYREL) 100 MG  tablet Take 200 mg by mouth at bedtime as needed for sleep.     No current facility-administered medications for this visit.     Allergies:   Patient has no known allergies.    Social History:  The patient  reports that she has quit smoking. Her smoking use included Cigarettes. She smoked 0.50 packs per day. She has never used smokeless tobacco. She reports that she does not drink alcohol or use drugs.   Family History:  The patient's family history is not on file.    ROS:  Please see the history of present illness.   Otherwise, review of systems are positive for none.   All other systems are reviewed and negative.    PHYSICAL EXAM: VS:  There were no vitals taken for this visit. , BMI There is no height or weight on file to calculate BMI. Affect appropriate Demented elderly female  HEENT: normal Neck supple with no adenopathy JVP normal no bruits no thyromegaly Lungs clear with no wheezing and good diaphragmatic motion Heart:  S1/S2 no murmur, no rub, gallop or click PMI normal Abdomen: benighn, BS positve, no tenderness, no AAA no bruit.  No HSM or HJR Distal pulses intact with no bruits  Plus one LE  edema Neuro non-focal Skin warm and dry No muscular weakness    EKG:  04/03/15 ST PAC;s LVH with strain    Recent Labs: 06/14/2016: ALT 19; B Natriuretic Peptide 188.0; BUN 15; Creatinine, Ser 0.67; Hemoglobin 12.4; Platelets 157; Potassium 3.9; Sodium 138    Lipid Panel    Component Value Date/Time   CHOL 140 03/16/2015 0242   TRIG 71 03/16/2015 0242   HDL 57 03/16/2015 0242   CHOLHDL 2.5 03/16/2015 0242   VLDL 14 03/16/2015 0242   LDLCALC 69 03/16/2015 0242      Wt Readings from Last 3 Encounters:  06/14/16 40.8 kg (90 lb)  06/17/15 43.1 kg (95 lb)  03/15/15 43.4 kg (95 lb 9.6 oz)      Other studies Reviewed: Additional studies/ records that were reviewed today include: ER notes .06/14/16 CXR, CT, labs echo from 2017    ASSESSMENT AND PLAN:  1.   Edema 2. Dyspne 3. Dementia 4. Choesterol    Current medicines are reviewed at length with the patient today.  The patient does not have concerns regarding medicines.  The following changes have been made:  no change  Labs/ tests ordered today include: *** No orders of the defined types were placed in this encounter.    Disposition:   FU with cardiology PRN      Signed, Jenkins Rouge, MD  07/29/2016 10:17 AM    [address], [place], [place]  [address] Phone: [phone]; Fax: [phone]

## 2016-07-29 NOTE — Patient Instructions (Signed)
Goals 1 Eat 3 meals and 3 high calorie high protein snacks daily. 2. Drink water for hydration 3. Watch salt content of processed foods due to HTN. 4. Consider Ensure Plus or [employer] brand 2-3 times a day in addition to what she eats. 5. User Mrs. Dash and other herbs and spices for flavor. 6. Minced meats of chicken salad, tuna salad and egg salad would be easily chewed and high in nutritional value for her. 7. May add cheese, pb, gravy, healthy oils to foods for increased calories. See handouts. Try some of the high calorie high protein recipes given. Call me in 1 month to discuss eating patterns.

## 2016-07-30 ENCOUNTER — Ambulatory Visit: Payer: Medicare Other | Admitting: Cardiovascular Disease

## 2016-07-30 ENCOUNTER — Encounter: Payer: Self-pay | Admitting: Cardiovascular Disease

## 2016-08-27 ENCOUNTER — Telehealth: Payer: Self-pay | Admitting: Nutrition

## 2016-08-29 NOTE — Telephone Encounter (Signed)
Talked to pt's daughter and rescheduled follow up appt.

## 2016-09-05 ENCOUNTER — Encounter (HOSPITAL_COMMUNITY): Payer: Self-pay | Admitting: Emergency Medicine

## 2016-09-05 ENCOUNTER — Emergency Department (HOSPITAL_COMMUNITY): Payer: Medicare Other

## 2016-09-05 ENCOUNTER — Observation Stay (HOSPITAL_COMMUNITY)
Admission: EM | Admit: 2016-09-05 | Discharge: 2016-09-07 | Disposition: A | Discharge status: Home / Self Care | Payer: Medicare Other | Attending: Internal Medicine | Admitting: Internal Medicine

## 2016-09-05 DIAGNOSIS — R918 Other nonspecific abnormal finding of lung field: Secondary | ICD-10-CM

## 2016-09-05 DIAGNOSIS — K56609 Unspecified intestinal obstruction, unspecified as to partial versus complete obstruction: Secondary | ICD-10-CM

## 2016-09-05 DIAGNOSIS — S32050A Wedge compression fracture of fifth lumbar vertebra, initial encounter for closed fracture: Secondary | ICD-10-CM | POA: Diagnosis present

## 2016-09-05 DIAGNOSIS — Z79899 Other long term (current) drug therapy: Secondary | ICD-10-CM | POA: Diagnosis not present

## 2016-09-05 DIAGNOSIS — K567 Ileus, unspecified: Secondary | ICD-10-CM | POA: Diagnosis present

## 2016-09-05 DIAGNOSIS — Z87891 Personal history of nicotine dependence: Secondary | ICD-10-CM | POA: Insufficient documentation

## 2016-09-05 DIAGNOSIS — R112 Nausea with vomiting, unspecified: Secondary | ICD-10-CM | POA: Diagnosis not present

## 2016-09-05 DIAGNOSIS — R1084 Generalized abdominal pain: Secondary | ICD-10-CM | POA: Diagnosis present

## 2016-09-05 DIAGNOSIS — R1900 Intra-abdominal and pelvic swelling, mass and lump, unspecified site: Secondary | ICD-10-CM | POA: Diagnosis not present

## 2016-09-05 DIAGNOSIS — F0390 Unspecified dementia without behavioral disturbance: Secondary | ICD-10-CM | POA: Diagnosis not present

## 2016-09-05 DIAGNOSIS — E278 Other specified disorders of adrenal gland: Secondary | ICD-10-CM | POA: Diagnosis present

## 2016-09-05 DIAGNOSIS — E43 Unspecified severe protein-calorie malnutrition: Secondary | ICD-10-CM | POA: Diagnosis present

## 2016-09-05 DIAGNOSIS — R9389 Abnormal findings on diagnostic imaging of other specified body structures: Secondary | ICD-10-CM | POA: Diagnosis present

## 2016-09-05 DIAGNOSIS — I10 Essential (primary) hypertension: Secondary | ICD-10-CM | POA: Diagnosis present

## 2016-09-05 DIAGNOSIS — F209 Schizophrenia, unspecified: Secondary | ICD-10-CM | POA: Diagnosis present

## 2016-09-05 DIAGNOSIS — J939 Pneumothorax, unspecified: Secondary | ICD-10-CM

## 2016-09-05 DIAGNOSIS — K56699 Other intestinal obstruction unspecified as to partial versus complete obstruction: Principal | ICD-10-CM | POA: Insufficient documentation

## 2016-09-05 HISTORY — DX: Other specified disorders of adrenal gland: E27.8

## 2016-09-05 HISTORY — DX: Pneumothorax, unspecified: J93.9

## 2016-09-05 HISTORY — DX: Bipolar disorder, unspecified: F31.9

## 2016-09-05 LAB — COMPREHENSIVE METABOLIC PANEL
ALK PHOS: 52 U/L (ref 38–126)
ALT: 12 U/L — AB (ref 14–54)
AST: 32 U/L (ref 15–41)
Albumin: 4.6 g/dL (ref 3.5–5.0)
Anion gap: 8 (ref 5–15)
BILIRUBIN TOTAL: 0.9 mg/dL (ref 0.3–1.2)
BUN: 17 mg/dL (ref 6–20)
CALCIUM: 9.2 mg/dL (ref 8.9–10.3)
CO2: 31 mmol/L (ref 22–32)
CREATININE: 0.73 mg/dL (ref 0.44–1.00)
Chloride: 106 mmol/L (ref 101–111)
GFR calc Af Amer: >60 mL/min (ref >60)
GLUCOSE: 120 mg/dL — AB (ref 65–99)
Potassium: 4.9 mmol/L (ref 3.5–5.1)
Sodium: 145 mmol/L (ref 135–145)
TOTAL PROTEIN: 8.7 g/dL — AB (ref 6.5–8.1)

## 2016-09-05 LAB — CBC
HCT: 46.2 % — ABNORMAL HIGH (ref 36.0–46.0)
Hemoglobin: 15.5 g/dL — ABNORMAL HIGH (ref 12.0–15.0)
MCH: 30.4 pg (ref 26.0–34.0)
MCHC: 33.5 g/dL (ref 30.0–36.0)
MCV: 90.6 fL (ref 78.0–100.0)
PLATELETS: 172 10*3/uL (ref 150–400)
RBC: 5.1 MIL/uL (ref 3.87–5.11)
RDW: 13.8 % (ref 11.5–15.5)
WBC: 10.4 10*3/uL (ref 4.0–10.5)

## 2016-09-05 LAB — LIPASE, BLOOD: Lipase: 26 U/L (ref 11–51)

## 2016-09-05 MED ORDER — SODIUM CHLORIDE 0.9 % IV SOLN
INTRAVENOUS | Status: DC
  Administered 2016-09-05: 23:00:00 via INTRAVENOUS

## 2016-09-05 MED ORDER — LORAZEPAM 2 MG/ML IJ SOLN
0.5000 mg | Freq: Once | INTRAMUSCULAR | Status: AC
  Administered 2016-09-05: 0.5 mg via INTRAVENOUS
  Filled 2016-09-05: qty 1

## 2016-09-05 MED ORDER — ACETAMINOPHEN 650 MG RE SUPP
650.0000 mg | Freq: Four times a day (QID) | RECTAL | Status: DC | PRN
Start: 2016-09-05 — End: 2016-09-07

## 2016-09-05 MED ORDER — CLONIDINE HCL 0.2 MG/24HR TD PTWK
0.2000 mg | MEDICATED_PATCH | TRANSDERMAL | Status: DC
  Administered 2016-09-05: 0.2 mg via TRANSDERMAL
  Filled 2016-09-05: qty 1

## 2016-09-05 MED ORDER — ONDANSETRON HCL 4 MG/2ML IJ SOLN
4.0000 mg | Freq: Four times a day (QID) | INTRAMUSCULAR | Status: DC | PRN
  Administered 2016-09-05: 4 mg via INTRAVENOUS
  Filled 2016-09-05: qty 2

## 2016-09-05 MED ORDER — ONDANSETRON HCL 4 MG PO TABS
4.0000 mg | ORAL_TABLET | Freq: Four times a day (QID) | ORAL | Status: DC | PRN
Start: 2016-09-05 — End: 2016-09-06

## 2016-09-05 MED ORDER — ENOXAPARIN SODIUM 30 MG/0.3ML ~~LOC~~ SOLN
30.0000 mg | SUBCUTANEOUS | Status: DC
  Administered 2016-09-05: 30 mg via SUBCUTANEOUS
  Filled 2016-09-05: qty 0.3

## 2016-09-05 MED ORDER — ONDANSETRON HCL 4 MG/2ML IJ SOLN
4.0000 mg | Freq: Once | INTRAMUSCULAR | Status: AC
  Administered 2016-09-05: 4 mg via INTRAVENOUS
  Filled 2016-09-05: qty 2

## 2016-09-05 MED ORDER — SODIUM CHLORIDE 0.9 % IV BOLUS (SEPSIS)
500.0000 mL | Freq: Once | INTRAVENOUS | Status: AC
  Administered 2016-09-05: 500 mL via INTRAVENOUS

## 2016-09-05 MED ORDER — HYDRALAZINE HCL 20 MG/ML IJ SOLN
10.0000 mg | INTRAMUSCULAR | Status: DC | PRN
  Administered 2016-09-05: 10 mg via INTRAVENOUS
  Filled 2016-09-05: qty 1

## 2016-09-05 MED ORDER — IOPAMIDOL (ISOVUE-300) INJECTION 61%
75.0000 mL | Freq: Once | INTRAVENOUS | Status: AC | PRN
  Administered 2016-09-05: 75 mL via INTRAVENOUS

## 2016-09-05 MED ORDER — ACETAMINOPHEN 325 MG PO TABS: 650.0000 mg | ORAL_TABLET | Freq: Four times a day (QID) | ORAL | Status: DC | PRN

## 2016-09-05 NOTE — H&P (Addendum)
TRH H&P    Patient Demographics:    Brittney Wall, is a 71 y.o. female  MRN: [id number]  DOB - 08/01/1945  Admit Date - 09/05/2016  Referring MD/NP/PA: Julianne Rice  Outpatient Primary MD for the patient is [name], MD  Patient coming from: Home  Chief Complaint  Patient presents with  . Abdominal Pain      HPI:    Brittney Wall  is a 71 y.o. female, With history of dementia, schizophrenia was brought to hospital after patient started having loose stools last night. As per patient's daughter she changed patient's 3 times. There was no blood in the stool noted. This morning patient had 2 episodes of vomiting. She also complained of abdominal pain. No fever chills. No chest pain or shortness of breath. Patient this time is alert and able to answer questions. She denies abdominal pain or chest pain.  In the ED, CT of the abdomen showed developing small bowel obstruction versus ileus. Questionable small right basilar pneumothorax versus bleb. Patient is not having any shortness of breath. She is not requiring oxygen. No chest pain.    Review of systems:      All other systems reviewed and are negative.   With Past History of the following :    Past Medical History:  Diagnosis Date  . Bipolar 1 disorder ([place])   . Dementia   . Hypertension   . Schizophrenia [facility])       Past Surgical History:  Procedure Laterality Date  . ortho procedure        Social History:      Social History  Substance Use Topics  . Smoking status: Former Smoker    Packs/day: 0.50    Types: Cigarettes  . Smokeless tobacco: Never Used  . Alcohol use No       Family History :   Unobtainable as patient has history of dementia   Home Medications:   Prior to Admission medications   Medication Sig Start Date End Date Taking? Authorizing Provider  cloNIDine (CATAPRES) 0.1 MG tablet Take 0.1 mg by mouth  3 (three) times daily.     Yes [provider]  minoxidil (LONITEN) 2.5 MG tablet Take 2 tablets (5 mg total) by mouth 2 (two) times daily. 03/17/15  Yes Donne Hazel, MD  donepezil (ARICEPT) 5 MG tablet Take 5 mg by mouth at bedtime.  02/16/15   [provider]  haloperidol (HALDOL) 5 MG tablet Take 5 mg by mouth at bedtime.  02/16/15   [provider]  hydrALAZINE (APRESOLINE) 10 MG tablet Take 10 mg by mouth at bedtime.  06/13/16   [provider]  LORazepam (ATIVAN) 0.5 MG tablet Take 0.5 mg by mouth at bedtime as needed. 07/19/16   [provider]  megestrol (MEGACE) 40 MG/ML suspension Take 800 mg by mouth daily. For appetite    [provider]  mirtazapine (REMERON) 30 MG tablet Take 30 mg by mouth at bedtime.      [provider]  pravastatin (PRAVACHOL) 20  MG tablet Take 20 mg by mouth at bedtime.     [provider]  traZODone (DESYREL) 100 MG tablet Take 200 mg by mouth at bedtime as needed for sleep.    [provider]     Allergies:    No Known Allergies   Physical Exam:   Vitals  Blood pressure (!) 214/87, pulse 79, temperature 98.7 F (37.1 C), temperature source Oral, resp. rate 18, height 5\' 5"  (1.651 m), weight 43.1 kg (95 lb), SpO2 100 %.  1.  General: Appears in no acute distress  2. Psychiatric: Awake, alert, not oriented to place and time  3. Neurologic: No focal neurological deficits, all cranial nerves intact.Strength 5/5 all 4 extremities, sensation intact all 4 extremities, plantars down going.  4. Eyes :  anicteric sclerae, moist conjunctivae with no lid lag. PERRLA.  5. ENMT:  Oropharynx clear with moist mucous membranes and good dentition  6. Neck:  supple, no cervical lymphadenopathy appriciated, No thyromegaly  7. Respiratory : Normal respiratory effort, good air movement bilaterally,clear to  auscultation bilaterally  8. Cardiovascular : RRR, no gallops, rubs or  murmurs, no leg edema  9. Gastrointestinal:  Abdomen soft, mild distention, non-tender to palpation,no hepatosplenomegaly, no rigidity or guarding       10. Skin:  No cyanosis, normal texture and turgor, no rash, lesions or ulcers  11.Musculoskeletal:  Good muscle tone,  joints appear normal , no effusions,  normal range of motion    Data Review:    CBC  Recent Labs Lab 09/05/16 1643  WBC 10.4  HGB 15.5*  HCT 46.2*  PLT 172  MCV 90.6  MCH 30.4  MCHC 33.5  RDW 13.8   ------------------------------------------------------------------------------------------------------------------  Chemistries   Recent Labs Lab 09/05/16 1643  NA 145  K 4.9  CL 106  CO2 31  GLUCOSE 120*  BUN 17  CREATININE 0.73  CALCIUM 9.2  AST 32  ALT 12*  ALKPHOS 52  BILITOT 0.9   ------------------------------------------------------------------------------------------------------------------  ------------------------------------------------------------------------------------------------------------------ GFR: Estimated Creatinine Clearance: 44.5 mL/min (by C-G formula based on SCr of 0.73 mg/dL). Liver Function Tests:  Recent Labs Lab 09/05/16 1643  AST 32  ALT 12*  ALKPHOS 52  BILITOT 0.9  PROT 8.7*  ALBUMIN 4.6    Recent Labs Lab 09/05/16 1643  LIPASE 26    --------------------------------------------------------------------------------------------------------------- Urine analysis:    Component Value Date/Time   COLORURINE YELLOW 03/12/2013 1350   APPEARANCEUR CLEAR 03/12/2013 1350   LABSPEC >1.030 (H) 03/12/2013 1350   PHURINE 5.5 03/12/2013 1350   GLUCOSEU NEGATIVE 03/12/2013 1350   HGBUR NEGATIVE 03/12/2013 1350   BILIRUBINUR NEGATIVE 03/12/2013 1350   KETONESUR NEGATIVE 03/12/2013 1350   PROTEINUR NEGATIVE 03/12/2013 1350   UROBILINOGEN 0.2 03/12/2013 1350   NITRITE NEGATIVE 03/12/2013 1350   LEUKOCYTESUR NEGATIVE 03/12/2013 1350      Imaging  Results:    Ct Abdomen Pelvis W Contrast  Result Date: 09/05/2016 CLINICAL DATA:  Nausea vomiting and diarrhea. EXAM: CT ABDOMEN AND PELVIS WITH CONTRAST TECHNIQUE: Multidetector CT imaging of the abdomen and pelvis was performed using the standard protocol following bolus administration of intravenous contrast. CONTRAST:  10mL ISOVUE-300 IOPAMIDOL (ISOVUE-300) INJECTION 61% COMPARISON:  Chest radiograph 09/05/2016 FINDINGS: Lower chest: Small right basilar pneumothorax versus incompletely evaluated bullous change in the right lung base. Small left pleural effusion. Hepatobiliary: No focal liver abnormality is seen. No gallstones, gallbladder wall thickening, or biliary dilatation. Pancreas: Unremarkable. No pancreatic ductal dilatation or surrounding inflammatory changes. Spleen: Linear hypoattenuation in  the lateral aspect of the spleen, nonspecific. Adrenals/Urinary Tract: Left adrenal mass measures 2.0 by 1.4 cm. Mild thickening of the right adrenal gland. Normal right kidney. Several too small to be accurately characterized hypoattenuated lesions in the left renal cortex. No evidence of hydronephrosis. Stomach/Bowel: Moderate distension of the stomach. Diffuse moderate distension of small bowel loops throughout the abdomen with maximum diameter of 3.4 Cm. No transitional point is identified. The colon is decompressed and contains normal pockets of gas. Vascular/Lymphatic: Aortic atherosclerosis. No enlarged abdominal or pelvic lymph nodes. Reproductive: Leiomyomatous uterus.  The ovaries are not identified. Other: Small amount of water density fluid throughout the abdomen and pelvis. Musculoskeletal: Age-indeterminate compression deformity of L5 vertebral body with approximately 35% height loss. This finding is new from 03/12/2013, CT of the abdomen. Disc osteophyte complex at L4-L5. IMPRESSION: Small basilar right pneumothorax versus bullous change in the right lung base, incompletely evaluated.  Early/intermittent small bowel obstruction versus ileus. No transitional point identified. Small amount of free fluid in the abdomen. Indeterminate left adrenal mass which measures up to 2 cm. Age-indeterminate compression fracture of L5 vertebral body. These results were called by telephone at the time of interpretation on 09/05/2016 at 7:10 pm to Dr. Julianne Rice , who verbally acknowledged these results. Electronically Signed   By: Fidela Salisbury M.D.   On: 09/05/2016 19:15   Dg Abd Acute W/chest  Result Date: 09/05/2016 CLINICAL DATA:  Nausea and vomiting starting yesterday. EXAM: DG ABDOMEN ACUTE W/ 1V CHEST COMPARISON:  Chest x-ray Jun 14, 2016 FINDINGS: There are multiple dilated small bowel loops. Air is identified in the abdomen. No radiopaque calculi or other significant radiographic abnormality is seen. Heart size and mediastinal contours are within normal limits. Small focal opacity is identified in the left lung base probably due to focal atelectasis. IMPRESSION: Findings consistent with developing small bowel obstruction. Electronically Signed   By: Abelardo Diesel M.D.   On: 09/05/2016 18:16       Assessment & Plan:    Active Problems:   Schizophrenia ([place])   Dementia   Ileus ([place])   1. Ileus versus SBO- CT abdomen pelvis shows early SBO versus ileus. Patient is not vomiting at this time.So will avoid NG tube insertion at this time, considering the fact that patient has underlying history of dementia and schizophrenia. Will keep her nothing by mouth, started on IV normal saline at 75 ML per hour. Repeat abdominal x-ray in a.m. 2. ? Pneumothorax- CT abdomen pelvis shows stable right basilar small pneumothorax versus bullous change. Patient is not having any signs and symptoms of pneumothorax. She denies chest pain or shortness of breath. Not requiring oxygen. Will hold pain chest x-ray PA and lateral in a.m. 3. Hypertension-will hold anti-hypertensive medications. Start Catapres  0.2 mg patch/24 hours. Hydralazine 10 mg IV every 4 hours when necessary for BP more than 160/100. 4. History of dementia/schizophrenia-no behavior  disturbance at this time, hold Aricept, Haldol due to nothing by mouth status as above.    DVT Prophylaxis-   Lovenox   AM Labs Ordered, also please review Full Orders  Family Communication: Admission, patients condition and plan of care including tests being ordered have been discussed with the patient  who indicate understanding and agree with the plan and Code Status.  Code Status:  Full code  Admission status: Observation   Time spent in minutes : 60 min   Mozell Hardacre S M.D on 09/05/2016 at 9:24 PM  Between 7am to 7pm - Pager -  [phone]. After 7pm go to [email or web address] - password [facility]  [facility] - Office  [phone]

## 2016-09-05 NOTE — ED Triage Notes (Signed)
Pt c/o D/N/V.  Pt vomited once in lobby.  Symptoms started yesterday.

## 2016-09-05 NOTE — ED Provider Notes (Signed)
[facility] DEPT Provider Note   CSN: [id number] Arrival date & time: 09/05/16  1233     History   Chief Complaint Chief Complaint  Patient presents with  . Abdominal Pain    HPI DOSHA BROSHEARS is a 71 y.o. female.  HPI Patient with history of dementia. Unable to contribute history. Level V caveat applies. Daughters at bedside. States patient began having multiple episodes of loose stool overnight. Since she changed the patient 3 times. No blood in the stool. Patient then had 2 episodes of vomiting this morning and one more loose stool. Daughter states patient has been complaining of abdominal pain. No fever or chills. No sick contacts. Past Medical History:  Diagnosis Date  . Bipolar 1 disorder ([place])   . Dementia   . Hypertension   . Schizophrenia [facility])     Patient Active Problem List   Diagnosis Date Noted  . Chest pain 03/15/2015  . Hypertensive urgency 03/15/2015  . Schizophrenia ([place]) 03/15/2015  . Tobacco abuse 03/15/2015  . Dementia 03/15/2015  . Protein-calorie malnutrition, severe ([place]) 03/15/2015  . Chest pain at rest 03/15/2015    Past Surgical History:  Procedure Laterality Date  . ortho procedure      OB History    No data available       Home Medications    Prior to Admission medications   Medication Sig Start Date End Date Taking? Authorizing Provider  cloNIDine (CATAPRES) 0.1 MG tablet Take 0.1 mg by mouth 3 (three) times daily.      [provider]  donepezil (ARICEPT) 5 MG tablet Take 5 mg by mouth at bedtime.  02/16/15   [provider]  haloperidol (HALDOL) 5 MG tablet Take 5 mg by mouth at bedtime.  02/16/15   [provider]  hydrALAZINE (APRESOLINE) 10 MG tablet Take 10 mg by mouth at bedtime.  06/13/16   [provider]  megestrol (MEGACE) 40 MG/ML suspension Take 800 mg by mouth daily. For appetite    [provider]  minoxidil (LONITEN) 2.5 MG tablet Take 2 tablets (5 mg total) by mouth 2  (two) times daily. 03/17/15   Donne Hazel, MD  mirtazapine (REMERON) 30 MG tablet Take 30 mg by mouth at bedtime.      [provider]  pravastatin (PRAVACHOL) 20 MG tablet Take 20 mg by mouth at bedtime.     [provider]  traZODone (DESYREL) 100 MG tablet Take 200 mg by mouth at bedtime as needed for sleep.    [provider]    Family History History reviewed. No pertinent family history.  Social History Social History  Substance Use Topics  . Smoking status: Former Smoker    Packs/day: 0.50    Types: Cigarettes  . Smokeless tobacco: Never Used  . Alcohol use No     Allergies   Patient has no known allergies.   Review of Systems Review of Systems  Unable to perform ROS: Dementia  Gastrointestinal: Positive for abdominal pain, diarrhea and vomiting.     Physical Exam Updated Vital Signs BP (!) 214/87   Pulse 79   Temp 98.7 F (37.1 C) (Oral)   Resp 18   Ht 5\' 5"  (1.651 m)   Wt 43.1 kg (95 lb)   SpO2 100%   BMI 15.81 kg/m   Physical Exam  Constitutional: She appears well-developed and well-nourished. No distress.  HENT:  Head: Normocephalic and atraumatic.  Mouth/Throat: Oropharynx is clear and moist. No oropharyngeal  exudate.  Moist mucous membranes  Eyes: Pupils are equal, round, and reactive to light. EOM are normal.  Neck: Normal range of motion. Neck supple.  Cardiovascular: Normal rate and regular rhythm.  Exam reveals no gallop and no friction rub.   No murmur heard. Pulmonary/Chest: Effort normal and breath sounds normal.  Abdominal: Soft. Bowel sounds are normal. She exhibits distension. There is tenderness. There is no rebound and no guarding.  Appears to have mild tenderness to palpation and diffuse distention of the abdomen. No rebound or guarding.  Musculoskeletal: Normal range of motion. She exhibits no edema or tenderness.  Neurological: She is alert.  Moving all extremities without focal deficit.  Skin: Skin  is warm and dry. No rash noted. She is not diaphoretic. No erythema.  Psychiatric: She has a normal mood and affect. Her behavior is normal.  Nursing note and vitals reviewed.    ED Treatments / Results  Labs (all labs ordered are listed, but only abnormal results are displayed) Labs Reviewed  COMPREHENSIVE METABOLIC PANEL - Abnormal; Notable for the following:       Result Value   Glucose, Bld 120 (*)    Total Protein 8.7 (*)    ALT 12 (*)    All other components within normal limits  CBC - Abnormal; Notable for the following:    Hemoglobin 15.5 (*)    HCT 46.2 (*)    All other components within normal limits  LIPASE, BLOOD  URINALYSIS, ROUTINE W REFLEX MICROSCOPIC    EKG  EKG Interpretation None       Radiology Ct Abdomen Pelvis W Contrast  Result Date: 09/05/2016 CLINICAL DATA:  Nausea vomiting and diarrhea. EXAM: CT ABDOMEN AND PELVIS WITH CONTRAST TECHNIQUE: Multidetector CT imaging of the abdomen and pelvis was performed using the standard protocol following bolus administration of intravenous contrast. CONTRAST:  26mL ISOVUE-300 IOPAMIDOL (ISOVUE-300) INJECTION 61% COMPARISON:  Chest radiograph 09/05/2016 FINDINGS: Lower chest: Small right basilar pneumothorax versus incompletely evaluated bullous change in the right lung base. Small left pleural effusion. Hepatobiliary: No focal liver abnormality is seen. No gallstones, gallbladder wall thickening, or biliary dilatation. Pancreas: Unremarkable. No pancreatic ductal dilatation or surrounding inflammatory changes. Spleen: Linear hypoattenuation in the lateral aspect of the spleen, nonspecific. Adrenals/Urinary Tract: Left adrenal mass measures 2.0 by 1.4 cm. Mild thickening of the right adrenal gland. Normal right kidney. Several too small to be accurately characterized hypoattenuated lesions in the left renal cortex. No evidence of hydronephrosis. Stomach/Bowel: Moderate distension of the stomach. Diffuse moderate distension  of small bowel loops throughout the abdomen with maximum diameter of 3.4 Cm. No transitional point is identified. The colon is decompressed and contains normal pockets of gas. Vascular/Lymphatic: Aortic atherosclerosis. No enlarged abdominal or pelvic lymph nodes. Reproductive: Leiomyomatous uterus.  The ovaries are not identified. Other: Small amount of water density fluid throughout the abdomen and pelvis. Musculoskeletal: Age-indeterminate compression deformity of L5 vertebral body with approximately 35% height loss. This finding is new from 03/12/2013, CT of the abdomen. Disc osteophyte complex at L4-L5. IMPRESSION: Small basilar right pneumothorax versus bullous change in the right lung base, incompletely evaluated. Early/intermittent small bowel obstruction versus ileus. No transitional point identified. Small amount of free fluid in the abdomen. Indeterminate left adrenal mass which measures up to 2 cm. Age-indeterminate compression fracture of L5 vertebral body. These results were called by telephone at the time of interpretation on 09/05/2016 at 7:10 pm to Dr. Julianne Rice , who verbally acknowledged these results. Electronically Signed  By: Fidela Salisbury M.D.   On: 09/05/2016 19:15   Dg Abd Acute W/chest  Result Date: 09/05/2016 CLINICAL DATA:  Nausea and vomiting starting yesterday. EXAM: DG ABDOMEN ACUTE W/ 1V CHEST COMPARISON:  Chest x-ray Jun 14, 2016 FINDINGS: There are multiple dilated small bowel loops. Air is identified in the abdomen. No radiopaque calculi or other significant radiographic abnormality is seen. Heart size and mediastinal contours are within normal limits. Small focal opacity is identified in the left lung base probably due to focal atelectasis. IMPRESSION: Findings consistent with developing small bowel obstruction. Electronically Signed   By: Abelardo Diesel M.D.   On: 09/05/2016 18:16    Procedures Procedures (including critical care time)  Medications Ordered in  ED Medications  LORazepam (ATIVAN) injection 0.5 mg (not administered)  sodium chloride 0.9 % bolus 500 mL (500 mLs Intravenous New Bag/Given 09/05/16 1644)  ondansetron (ZOFRAN) injection 4 mg (4 mg Intravenous Given 09/05/16 1644)  iopamidol (ISOVUE-300) 61 % injection 75 mL (75 mLs Intravenous Contrast Given 09/05/16 1834)     Initial Impression / Assessment and Plan / ED Course  I have reviewed the triage vital signs and the nursing notes.  Pertinent labs & imaging results that were available during my care of the patient were reviewed by me and considered in my medical decision making (see chart for details).    Patient with no respiratory distress. Maintaining saturations on room air. CT with evidence of developing small bowel instruction. Questionable small basilar pneumothorax versus bleb. Discussed with hospitalist who will see patient in the emergency department and admit.   Final Clinical Impressions(s) / ED Diagnoses   Final diagnoses:  SBO (small bowel obstruction) ([place])    New Prescriptions New Prescriptions   No medications on file     Julianne Rice, MD 09/05/16 2011

## 2016-09-06 ENCOUNTER — Observation Stay (HOSPITAL_COMMUNITY): Payer: Medicare Other

## 2016-09-06 ENCOUNTER — Encounter (HOSPITAL_COMMUNITY): Payer: Self-pay | Admitting: Internal Medicine

## 2016-09-06 ENCOUNTER — Other Ambulatory Visit: Payer: Self-pay

## 2016-09-06 DIAGNOSIS — S32050A Wedge compression fracture of fifth lumbar vertebra, initial encounter for closed fracture: Secondary | ICD-10-CM | POA: Diagnosis present

## 2016-09-06 DIAGNOSIS — K567 Ileus, unspecified: Secondary | ICD-10-CM | POA: Diagnosis not present

## 2016-09-06 DIAGNOSIS — I10 Essential (primary) hypertension: Secondary | ICD-10-CM | POA: Diagnosis not present

## 2016-09-06 DIAGNOSIS — K56609 Unspecified intestinal obstruction, unspecified as to partial versus complete obstruction: Secondary | ICD-10-CM | POA: Diagnosis not present

## 2016-09-06 DIAGNOSIS — E43 Unspecified severe protein-calorie malnutrition: Secondary | ICD-10-CM | POA: Diagnosis not present

## 2016-09-06 DIAGNOSIS — R938 Abnormal findings on diagnostic imaging of other specified body structures: Secondary | ICD-10-CM

## 2016-09-06 DIAGNOSIS — E278 Other specified disorders of adrenal gland: Secondary | ICD-10-CM

## 2016-09-06 DIAGNOSIS — R9389 Abnormal findings on diagnostic imaging of other specified body structures: Secondary | ICD-10-CM | POA: Diagnosis present

## 2016-09-06 DIAGNOSIS — E279 Disorder of adrenal gland, unspecified: Secondary | ICD-10-CM

## 2016-09-06 HISTORY — DX: Other specified disorders of adrenal gland: E27.8

## 2016-09-06 LAB — COMPREHENSIVE METABOLIC PANEL
ALT: 9 U/L — AB (ref 14–54)
AST: 19 U/L (ref 15–41)
Albumin: 3.4 g/dL — ABNORMAL LOW (ref 3.5–5.0)
Alkaline Phosphatase: 39 U/L (ref 38–126)
Anion gap: 5 (ref 5–15)
BILIRUBIN TOTAL: 0.5 mg/dL (ref 0.3–1.2)
BUN: 19 mg/dL (ref 6–20)
CHLORIDE: 110 mmol/L (ref 101–111)
CO2: 26 mmol/L (ref 22–32)
CREATININE: 0.6 mg/dL (ref 0.44–1.00)
Calcium: 8.2 mg/dL — ABNORMAL LOW (ref 8.9–10.3)
Glucose, Bld: 115 mg/dL — ABNORMAL HIGH (ref 65–99)
Potassium: 3.8 mmol/L (ref 3.5–5.1)
Sodium: 141 mmol/L (ref 135–145)
TOTAL PROTEIN: 6.3 g/dL — AB (ref 6.5–8.1)

## 2016-09-06 LAB — CBC
HEMATOCRIT: 37.9 % (ref 36.0–46.0)
Hemoglobin: 12.9 g/dL (ref 12.0–15.0)
MCH: 30.7 pg (ref 26.0–34.0)
MCHC: 34 g/dL (ref 30.0–36.0)
MCV: 90.2 fL (ref 78.0–100.0)
PLATELETS: 163 10*3/uL (ref 150–400)
RBC: 4.2 MIL/uL (ref 3.87–5.11)
RDW: 13.9 % (ref 11.5–15.5)
WBC: 7.8 10*3/uL (ref 4.0–10.5)

## 2016-09-06 LAB — MRSA PCR SCREENING: MRSA BY PCR: NEGATIVE

## 2016-09-06 MED ORDER — HYDRALAZINE HCL 20 MG/ML IJ SOLN
10.0000 mg | INTRAMUSCULAR | Status: DC
  Administered 2016-09-06 (×3): 10 mg via INTRAVENOUS
  Filled 2016-09-06 (×3): qty 1

## 2016-09-06 MED ORDER — HALOPERIDOL 5 MG PO TABS
5.0000 mg | ORAL_TABLET | Freq: Every day | ORAL | Status: DC
  Administered 2016-09-06: 5 mg via ORAL
  Filled 2016-09-06: qty 1

## 2016-09-06 MED ORDER — MINOXIDIL 2.5 MG PO TABS
5.0000 mg | ORAL_TABLET | Freq: Two times a day (BID) | ORAL | Status: DC
  Administered 2016-09-07: 5 mg via ORAL
  Filled 2016-09-06 (×6): qty 2

## 2016-09-06 MED ORDER — KCL IN DEXTROSE-NACL 20-5-0.9 MEQ/L-%-% IV SOLN
INTRAVENOUS | Status: DC
  Administered 2016-09-06 – 2016-09-07 (×2): via INTRAVENOUS

## 2016-09-06 MED ORDER — HYDRALAZINE HCL 20 MG/ML IJ SOLN: 5.0000 mg | INTRAMUSCULAR | Status: DC | PRN

## 2016-09-06 MED ORDER — DONEPEZIL HCL 5 MG PO TABS: 5.0000 mg | ORAL_TABLET | Freq: Every day | ORAL | Status: DC

## 2016-09-06 MED ORDER — SENNOSIDES-DOCUSATE SODIUM 8.6-50 MG PO TABS
1.0000 | ORAL_TABLET | Freq: Every day | ORAL | Status: DC
  Administered 2016-09-06: 1 via ORAL
  Filled 2016-09-06: qty 1

## 2016-09-06 MED ORDER — LORAZEPAM 2 MG/ML IJ SOLN
0.5000 mg | Freq: Once | INTRAMUSCULAR | Status: AC
  Administered 2016-09-06: 0.5 mg via INTRAVENOUS
  Filled 2016-09-06: qty 1

## 2016-09-06 MED ORDER — ONDANSETRON HCL 4 MG/2ML IJ SOLN
4.0000 mg | Freq: Three times a day (TID) | INTRAMUSCULAR | Status: DC
  Administered 2016-09-06 – 2016-09-07 (×3): 4 mg via INTRAVENOUS
  Filled 2016-09-06 (×3): qty 2

## 2016-09-06 MED ORDER — LORAZEPAM 2 MG/ML IJ SOLN
0.2500 mg | INTRAMUSCULAR | Status: DC | PRN
  Administered 2016-09-06 – 2016-09-07 (×4): 0.5 mg via INTRAVENOUS
  Filled 2016-09-06 (×4): qty 1

## 2016-09-06 MED ORDER — HYDRALAZINE HCL 25 MG PO TABS
25.0000 mg | ORAL_TABLET | Freq: Two times a day (BID) | ORAL | Status: DC
  Administered 2016-09-06 – 2016-09-07 (×2): 25 mg via ORAL
  Filled 2016-09-06 (×2): qty 1

## 2016-09-06 MED ORDER — TRAZODONE HCL 50 MG PO TABS
50.0000 mg | ORAL_TABLET | Freq: Every day | ORAL | Status: DC
  Administered 2016-09-06: 50 mg via ORAL
  Filled 2016-09-06: qty 1

## 2016-09-06 MED ORDER — FAMOTIDINE IN NACL 20-0.9 MG/50ML-% IV SOLN
20.0000 mg | INTRAVENOUS | Status: DC
  Administered 2016-09-06 – 2016-09-07 (×2): 20 mg via INTRAVENOUS
  Filled 2016-09-06 (×2): qty 50

## 2016-09-06 NOTE — Care Management Obs Status (Signed)
[place] NOTIFICATION   Patient Details  Name: KARLISSA ARON MRN: [id number] Date of Birth: 1945-12-29   Medicare Observation Status Notification Given:  Yes    Diontae Route, Chauncey Reading, RN 09/06/2016, 12:55 PM

## 2016-09-06 NOTE — Care Management Note (Signed)
Case Management Note  Patient Details  Name: Brittney Wall MRN: [id number] Date of Birth: 24-Nov-1945  Subjective/Objective:  Adm with ?Ileus vs SBO. Lives with daughter, has dementia and schizophrenia. CM consulted for [facility] needs. Discussed with [name], daughter, she states patient has had Leon in the past with [facility] and is agreeable again.            Action/Plan: [facility] of [facility] notified and will obtain orders from chart. Patient will need [id number] orders.    Expected Discharge Date:      09/08/2016           Expected Discharge Plan:  [place]  In-House Referral:     Discharge planning Services  CM Consult  Post Acute Care Choice:  Home Health Choice offered to:  Adult Children  DME Arranged:    DME Agency:     HH Arranged:  RN, PT [place] Agency:  [place]  Status of Service:  Completed, signed off  If discussed at [place] of Stay Meetings, dates discussed:    Additional Comments:  Jerzy Roepke, Chauncey Reading, RN 09/06/2016, 1:11 PM

## 2016-09-06 NOTE — Plan of Care (Signed)
Problem: Safety: Goal: Ability to remain free from injury will improve Outcome: Progressing Pt high fall risk. Bed in low position, side rails up, call bell within reach, bed alarm activated, and  family at bedside.

## 2016-09-06 NOTE — Progress Notes (Signed)
Patient is confused. Trying to get OOB. Family is at the bedside for support. IV patent. Vital signs are stable. Report given to Leonette Most, RN. Patient transferred to room 303 via bed. Bed is in the lowest position and bed alarm is on.

## 2016-09-06 NOTE — Progress Notes (Signed)
   09/06/16 1726  What Happened  Was fall witnessed? No  Was patient injured? No  Patient found on floor  Found by Staff-comment Lattie Haw RN)  Stated prior activity other (comment)  Follow Up  MD notified yes  Time MD notified 78  Family notified Yes-comment  Time family notified 1743  Additional tests No  Progress note created (see row info) Yes  Adult Fall Risk Assessment  Risk Factor Category (scoring not indicated) High fall risk per protocol (document High fall risk);Fall has occurred during this admission (document High fall risk)  Age 71  Fall History: Fall within 6 months prior to admission 5  Elimination; Bowel and/or Urine Incontinence 2  Elimination; Bowel and/or Urine Urgency/Frequency 2  Medications: includes PCA/Opiates, Anti-convulsants, Anti-hypertensives, Diuretics, Hypnotics, Laxatives, Sedatives, and Psychotropics 3  Patient Care Equipment 1  Mobility-Assistance 2  Mobility-Gait 2  Mobility-Sensory Deficit 0  Altered awareness of immediate physical environment 1  Impulsiveness 2  Lack of understanding of one's physical/cognitive limitations 4  Total Score 26  Patient's Fall Risk High Fall Risk (>13 points)  Adult Fall Risk Interventions  Required Bundle Interventions *See Row Information* High fall risk - low, moderate, and high requirements implemented  Additional Interventions Use of appropriate toileting equipment (bedpan, BSC, etc.)  Screening for Fall Injury Risk  Risk For Fall Injury- See Row Information  None identified  Vitals  Temp 98 F (36.7 C)  Temp Source Oral  BP (!) 170/87  BP Location Left Arm  BP Method Automatic  Patient Position (if appropriate) Sitting  Pulse Rate (!) 109  Pulse Rate Source Dinamap  Resp 18  Oxygen Therapy  SpO2 92 %  Pain Assessment  Pain Assessment PAINAD  PAINAD (Pain Assessment in Advanced Dementia)  Breathing 0  Negative Vocalization 0  Facial Expression 0  Body Language 1  Consolability 0  PAINAD  Score 1

## 2016-09-06 NOTE — Progress Notes (Signed)
Pt agitated and combative. 0.5 Ativan given at 2116 but with no improvement. Sitter has to hold pt hands to keep her from swinging. Dr Tamala Julian paged.

## 2016-09-06 NOTE — Progress Notes (Signed)
PROGRESS NOTE    Brittney Wall  GMW:[id number] DOB: 1945-07-22 DOA: 09/05/2016 PCP: [name], MD    Brief Narrative:  Patient is a 71 year old woman with a history of HTN, dementia, and schizophrenia who presented with abdominal pain, nausea, vomiting, and loose stools. In the ED, she was afebrile and hypertensive with a blood pressure greater than 440 systolically. CT of the abdomen/pelvis revealed small basilar right pneumothorax versus bullous change in the right lung base; early SBO versus ileus; small amount of free fluid in abdomen; left adrenal mass measures up to 2 cm; an age indeterminate compression fracture of L5. She was admitted for further evaluation and management.   Assessment & Plan:   Principal Problem:   Ileus ([place]) Active Problems:   Malignant hypertension   Schizophrenia (HCC)   Dementia with behavioral disturbance   Protein-calorie malnutrition, severe (HCC)   Left adrenal mass (HCC)   Abnormal CT scan   Compression fracture of L5 lumbar vertebra (HCC)   1. Ileus vs. SBO. Patient presented with N/V and loose stools, but with radiographic evidence of SBO versus ileus. -She was started on maintenance IV fluids and supportive treatment. NG tube did not appear to be necessary on admission-agreed. -Patient's abdomen is soft and she has some bowel sounds and minimal abdominal tenderness. -We'll change IV fluids to add dextrose and potassium. -We'll start clear liquids with caution.  CT showing abnormal lung finding CT of the abdomen revealed small basilar right pneumothorax versus bullous change in the right lung base. Patient does not appear to have shortness of breath and she is oxygenating 96-100% on room air. -Two-view chest x-ray ordered for evaluation.  Malignant hypertension. Patient is treated chronically with clonidine and minoxidil and hydralazine. -Her blood pressure was over 200 on admission. -Due to her nothing by mouth status, Catapres patch  was started. -We'll change hydralazine to when necessary to standing dose of 10 mg every 4 hours.  Dementia with behavioral disturbance/schizophrenia. -Pending review by pharmacy, the patient's home medications were recorded as Remeron, trazodone, lorazepam, Haldol, and Aricept. -Medications were withheld on admission due to ileus versus SBO. -Patient has had some agitation and has apparently pulled out for IVs following admission. -We will restart Aricept 5 mg daily and Haldol 5 mg daily at bedtime and trazodone at bedtime.. Will give when necessary IV Ativan.  Incidental finding of left adrenal mass. -Noted. This will need to be monitored radiographically. Will hold on further workup.  DVT prophylaxis: Lovenox Code Status: Full code Family Communication: Discussed with daughter [name] Disposition Plan: Discharge to home in the next 24-48 hours and if clinically appropriate   Consultants:   None  Procedures:   None  Antimicrobials:   None   Subjective: Patient is confused and is trying to take off the hand mittens. Her daughter, [name] is in the room and states that the patient pulled out 3-4 IVs overnight. No reported bowel movement or vomiting.  Objective: Vitals:   09/05/16 2156 09/05/16 2230 09/06/16 0000 09/06/16 0600  BP: (!) 207/99 (!) 218/89 (!) 161/64 (!) 151/71  Pulse: 86     Resp: 17 16    Temp:  99.5 F (37.5 C)  98.7 F (37.1 C)  TempSrc:  Axillary  Axillary  SpO2: 99% 100%  100%  Weight:  41.3 kg (91 lb 0.8 oz)    Height:  5\' 5"  (1.651 m)      Intake/Output Summary (Last 24 hours) at 09/06/16 3474 Last data filed at 09/06/16  0600  Gross per 24 hour  Intake           516.25 ml  Output                0 ml  Net           516.25 ml   Filed Weights   09/05/16 1246 09/05/16 2230  Weight: 43.1 kg (95 lb) 41.3 kg (91 lb 0.8 oz)    Examination:  General exam: Appears Confused and mildly agitated, trying to take off her mittens.  Respiratory system:  Clear clear anteriorly with decreased breath sounds in the bases. Respiratory effort normal. Cardiovascular system: S1 & S2 heard, with soft systolic murmur. No pedal edema. Gastrointestinal system: Abdomen is nondistended, soft and mildly tender in the hypogastrium. No organomegaly or masses felt. Bowel sounds hypoactive but present. Central nervous system: Alert and oriented to her daughter and herself, but not to place, time, and year. Cranial nerves appear to be grossly intact. Extremities: She is moving all of her extremities, in an attempt to take off the hand mittens. There is diffuse muscle atrophy. Skin: No rashes, lesions or ulcers Psychiatry: Judgement and insight are impaired due to dementia with some agitation.    Data Reviewed: I have personally reviewed following labs and imaging studies  CBC:  Recent Labs Lab 09/05/16 1643 09/06/16 0601  WBC 10.4 7.8  HGB 15.5* 12.9  HCT 46.2* 37.9  MCV 90.6 90.2  PLT 172 673   Basic Metabolic Panel:  Recent Labs Lab 09/05/16 1643 09/06/16 0601  NA 145 141  K 4.9 3.8  CL 106 110  CO2 31 26  GLUCOSE 120* 115*  BUN 17 19  CREATININE 0.73 0.60  CALCIUM 9.2 8.2*   GFR: Estimated Creatinine Clearance: 42.7 mL/min (by C-G formula based on SCr of 0.6 mg/dL). Liver Function Tests:  Recent Labs Lab 09/05/16 1643 09/06/16 0601  AST 32 19  ALT 12* 9*  ALKPHOS 52 39  BILITOT 0.9 0.5  PROT 8.7* 6.3*  ALBUMIN 4.6 3.4*    Recent Labs Lab 09/05/16 1643  LIPASE 26   No results for input(s): AMMONIA in the last 168 hours. Coagulation Profile: No results for input(s): INR, PROTIME in the last 168 hours. Cardiac Enzymes: No results for input(s): CKTOTAL, CKMB, CKMBINDEX, TROPONINI in the last 168 hours. BNP (last 3 results) No results for input(s): PROBNP in the last 8760 hours. HbA1C: No results for input(s): HGBA1C in the last 72 hours. CBG: No results for input(s): GLUCAP in the last 168 hours. Lipid Profile: No  results for input(s): CHOL, HDL, LDLCALC, TRIG, CHOLHDL, LDLDIRECT in the last 72 hours. Thyroid Function Tests: No results for input(s): TSH, T4TOTAL, FREET4, T3FREE, THYROIDAB in the last 72 hours. Anemia Panel: No results for input(s): VITAMINB12, FOLATE, FERRITIN, TIBC, IRON, RETICCTPCT in the last 72 hours. Sepsis Labs: No results for input(s): PROCALCITON, LATICACIDVEN in the last 168 hours.  Recent Results (from the past 240 hour(s))  MRSA PCR Screening     Status: None   Collection Time: 09/05/16 10:21 PM  Result Value Ref Range Status   MRSA by PCR NEGATIVE NEGATIVE Final    Comment:        The GeneXpert MRSA Assay (FDA approved for NASAL specimens only), is one component of a comprehensive MRSA colonization surveillance program. It is not intended to diagnose MRSA infection nor to guide or monitor treatment for MRSA infections.          Radiology  Studies: Ct Abdomen Pelvis W Contrast  Result Date: 09/05/2016 CLINICAL DATA:  Nausea vomiting and diarrhea. EXAM: CT ABDOMEN AND PELVIS WITH CONTRAST TECHNIQUE: Multidetector CT imaging of the abdomen and pelvis was performed using the standard protocol following bolus administration of intravenous contrast. CONTRAST:  22mL ISOVUE-300 IOPAMIDOL (ISOVUE-300) INJECTION 61% COMPARISON:  Chest radiograph 09/05/2016 FINDINGS: Lower chest: Small right basilar pneumothorax versus incompletely evaluated bullous change in the right lung base. Small left pleural effusion. Hepatobiliary: No focal liver abnormality is seen. No gallstones, gallbladder wall thickening, or biliary dilatation. Pancreas: Unremarkable. No pancreatic ductal dilatation or surrounding inflammatory changes. Spleen: Linear hypoattenuation in the lateral aspect of the spleen, nonspecific. Adrenals/Urinary Tract: Left adrenal mass measures 2.0 by 1.4 cm. Mild thickening of the right adrenal gland. Normal right kidney. Several too small to be accurately characterized  hypoattenuated lesions in the left renal cortex. No evidence of hydronephrosis. Stomach/Bowel: Moderate distension of the stomach. Diffuse moderate distension of small bowel loops throughout the abdomen with maximum diameter of 3.4 Cm. No transitional point is identified. The colon is decompressed and contains normal pockets of gas. Vascular/Lymphatic: Aortic atherosclerosis. No enlarged abdominal or pelvic lymph nodes. Reproductive: Leiomyomatous uterus.  The ovaries are not identified. Other: Small amount of water density fluid throughout the abdomen and pelvis. Musculoskeletal: Age-indeterminate compression deformity of L5 vertebral body with approximately 35% height loss. This finding is new from 03/12/2013, CT of the abdomen. Disc osteophyte complex at L4-L5. IMPRESSION: Small basilar right pneumothorax versus bullous change in the right lung base, incompletely evaluated. Early/intermittent small bowel obstruction versus ileus. No transitional point identified. Small amount of free fluid in the abdomen. Indeterminate left adrenal mass which measures up to 2 cm. Age-indeterminate compression fracture of L5 vertebral body. These results were called by telephone at the time of interpretation on 09/05/2016 at 7:10 pm to Dr. Julianne Rice , who verbally acknowledged these results. Electronically Signed   By: Fidela Salisbury M.D.   On: 09/05/2016 19:15   Dg Abd Acute W/chest  Result Date: 09/05/2016 CLINICAL DATA:  Nausea and vomiting starting yesterday. EXAM: DG ABDOMEN ACUTE W/ 1V CHEST COMPARISON:  Chest x-ray Jun 14, 2016 FINDINGS: There are multiple dilated small bowel loops. Air is identified in the abdomen. No radiopaque calculi or other significant radiographic abnormality is seen. Heart size and mediastinal contours are within normal limits. Small focal opacity is identified in the left lung base probably due to focal atelectasis. IMPRESSION: Findings consistent with developing small bowel  obstruction. Electronically Signed   By: Abelardo Diesel M.D.   On: 09/05/2016 18:16        Scheduled Meds: . cloNIDine  0.2 mg Transdermal Weekly  . [START ON 09/07/2016] donepezil  5 mg Oral QHS  . enoxaparin (LOVENOX) injection  30 mg Subcutaneous Q24H  . haloperidol  5 mg Oral QHS  . ondansetron (ZOFRAN) IV  4 mg Intravenous Q8H  . traZODone  50 mg Oral QHS   Continuous Infusions: . dextrose 5 % and 0.9 % NaCl with KCl 20 mEq/L    . famotidine (PEPCID) IV       LOS: 0 days    Time spent: 26 minutes    Rexene Alberts, MD [facility] Pager [phone]  If 7PM-7AM, please contact night-coverage [email or web address] Password [email or web address] 09/06/2016, 9:07 AM

## 2016-09-06 NOTE — Clinical Social Work Note (Signed)
CSW consulted for [facility] needs. This is a CM function. LCSW to make CM aware and need will be addressed appropriately.   LCSW signing off.    [place] Hao, Clydene Pugh, LCSW

## 2016-09-06 NOTE — Progress Notes (Signed)
Spoke to daughter who stated that would be returning tonight to stay with her mother.

## 2016-09-07 ENCOUNTER — Observation Stay (HOSPITAL_COMMUNITY): Payer: Medicare Other

## 2016-09-07 ENCOUNTER — Encounter (HOSPITAL_COMMUNITY): Payer: Self-pay | Admitting: Internal Medicine

## 2016-09-07 DIAGNOSIS — K567 Ileus, unspecified: Secondary | ICD-10-CM | POA: Diagnosis not present

## 2016-09-07 DIAGNOSIS — F2089 Other schizophrenia: Secondary | ICD-10-CM

## 2016-09-07 DIAGNOSIS — J90 Pleural effusion, not elsewhere classified: Secondary | ICD-10-CM | POA: Diagnosis not present

## 2016-09-07 DIAGNOSIS — F0391 Unspecified dementia with behavioral disturbance: Secondary | ICD-10-CM

## 2016-09-07 DIAGNOSIS — E279 Disorder of adrenal gland, unspecified: Secondary | ICD-10-CM | POA: Diagnosis not present

## 2016-09-07 DIAGNOSIS — I10 Essential (primary) hypertension: Secondary | ICD-10-CM | POA: Diagnosis not present

## 2016-09-07 DIAGNOSIS — E43 Unspecified severe protein-calorie malnutrition: Secondary | ICD-10-CM | POA: Diagnosis not present

## 2016-09-07 DIAGNOSIS — J939 Pneumothorax, unspecified: Secondary | ICD-10-CM

## 2016-09-07 HISTORY — DX: Pneumothorax, unspecified: J93.9

## 2016-09-07 LAB — URINALYSIS, ROUTINE W REFLEX MICROSCOPIC
Bilirubin Urine: NEGATIVE
Glucose, UA: NEGATIVE mg/dL
Hgb urine dipstick: NEGATIVE
KETONES UR: NEGATIVE mg/dL
LEUKOCYTES UA: NEGATIVE
NITRITE: NEGATIVE
PH: 5 (ref 5.0–8.0)
Protein, ur: NEGATIVE mg/dL
SPECIFIC GRAVITY, URINE: 1.008 (ref 1.005–1.030)

## 2016-09-07 MED ORDER — HYDRALAZINE HCL 10 MG PO TABS: 20.0000 mg | ORAL_TABLET | Freq: Every day | ORAL | 2 refills | Status: DC

## 2016-09-07 MED ORDER — SENNOSIDES-DOCUSATE SODIUM 8.6-50 MG PO TABS: 1.0000 | ORAL_TABLET | Freq: Every day | ORAL | Status: DC

## 2016-09-07 NOTE — Discharge Summary (Signed)
Physician Discharge Summary  Brittney Wall:[id number] DOB: [birth year] DOA: 09/05/2016  PCP: [name], MD  Admit date: 09/05/2016 Discharge date: 09/07/2016  Time spent: Greater than 30 minutes  Recommendations for Outpatient Follow-up:  1. Recommend conservative monitoring of left adrenal mass versus referral for additional workup.  2. Home health registered nurse and physical therapy was ordered at the time of discharge.   Discharge Diagnoses:  1. Ileus. Resolved. 2. Malignant hypertension. 3. Probable tiny right lower lobe pneumothorax, unchanged compared to 06/2016 study. 4. Indeterminate left adrenal mass measures up to 2 cm. 5. Age-indeterminate compression fracture of L5. 6. Dementia with behavioral disturbance. 7. Schizophrenia.  Discharge Condition: Stable  Diet recommendation: Heart healthy  Filed Weights   09/05/16 1246 09/05/16 2230 09/06/16 1300  Weight: 43.1 kg (95 lb) 41.3 kg (91 lb 0.8 oz) 42.8 kg (94 lb 5.7 oz)    History of present illness:  Patient is a 71 year old woman with a history of HTN, dementia, and schizophrenia who presented with abdominal pain, nausea, vomiting, and loose stools. In the ED, she was afebrile and hypertensive with a blood pressure greater than 355 systolically. CT of the abdomen/pelvis revealed small basilar right pneumothorax versus bullous change in the right lung base; early SBO versus ileus; small amount of free fluid in abdomen; left adrenal mass measures up to 2 cm; an age indeterminate compression fracture of L5. She was admitted for further evaluation and management.   Hospital Course:  1. Ileus. Patient presented with N/V and loose stools, but with radiographic evidence of SBO versus ileus. -She was started on maintenance IV fluids and supportive treatment. NG tube was not necessary. -Patient's abdomen became soft and she had active bowel sounds. Clear liquids were tried which she tolerated well. Her diet was advanced  to full liquids which she tolerated well. -Reportedly she had one soft bowel movement. -Her ileus resolved. She was discharged on daily Senokot S.  Tiny right lower lobe pneumothorax. CT of the abdomen revealed small basilar right pneumothorax versus bullous change in the right lung base. Patient did not appear to have shortness of breath and she was oxygenating 96-100% on room air. -Two-view chest x-ray ordered for evaluation, but patient was too agitated. -Portable chest x-ray was ordered for follow-up evaluation prior to discharge. Per the radiologist, the small amount of pleural air seen on the CT of the abdomen 7/26 was too small to visualize radiographically. Of note, it was stable from the 06/14/2016 radiograph and is chronic. -Recommend continued conservative monitoring.  Malignant hypertension. Patient is treated chronically with clonidine and minoxidil and hydralazine. -Her blood pressure was over 200 on admission. -Due to her nothing by mouth status, Catapres patch was started and scheduled IV hydralazine was given. -Once her ileus resolved, her oral medications were restarted. -At the time of discharge, the dose of the hydralazine was increased to 20 mg daily at bedtime. -Further management per her PCP.  Dementia with behavioral disturbance/schizophrenia. -Patient was restarted on her chronic medications once she was no longer nothing by mouth.  Ativan was given IV when necessary for agitation. -A safety sitter was ordered. -She was redirected and was somewhat calm at the time of discharge.  Incidental finding of left adrenal mass. -Noted. This finding may need to be monitored radiographically. Will defer further workup and evaluation to her PCP. However, given her advanced dementia and fragility, watchful waiting/monitoring would be considered appropriate in this patient.  Procedures:  None  Consultations:  None  Discharge  Exam: Vitals:   09/07/16 0028 09/07/16 0633   BP: (!) 184/78 (!) 177/73  Pulse: 81 82  Resp: 19 16  Temp: 97.7 F (36.5 C) 97.9 F (36.6 C)   Oxygen saturation on room air 96%. General exam:  frail elderly woman who is currently calm; no acute distress.   Respiratory system: Clear clear anteriorly with decreased breath sounds in the bases. Respiratory effort normal. Cardiovascular system: S1 & S2 heard, with soft systolic murmur. No pedal edema. Gastrointestinal system: Abdomen is nondistended, soft and mildly tender in the hypogastrium. No organomegaly or masses felt. Bowel sounds hypoactive but present. Extremities: No pedal edema. Mild diffuse muscle atrophy. She moves all of her extremities spontaneously.   Discharge Instructions   Discharge Instructions    Diet - low sodium heart healthy    Complete by:  As directed    Increase activity slowly    Complete by:  As directed      Current Discharge Medication List    START taking these medications   Details  senna-docusate (SENOKOT-S) 8.6-50 MG tablet Take 1 tablet by mouth at bedtime.      CONTINUE these medications which have CHANGED   Details  hydrALAZINE (APRESOLINE) 10 MG tablet Take 2 tablets (20 mg total) by mouth at bedtime. Qty: 60 tablet, Refills: 2      CONTINUE these medications which have NOT CHANGED   Details  cloNIDine (CATAPRES) 0.1 MG tablet Take 0.1 mg by mouth 3 (three) times daily.      haloperidol (HALDOL) 5 MG tablet Take 5 mg by mouth at bedtime.     minoxidil (LONITEN) 2.5 MG tablet Take 2 tablets (5 mg total) by mouth 2 (two) times daily. Qty: 60 tablet, Refills: 0    mirtazapine (REMERON) 30 MG tablet Take 30 mg by mouth at bedtime.      pravastatin (PRAVACHOL) 20 MG tablet Take 20 mg by mouth at bedtime.     traZODone (DESYREL) 100 MG tablet Take 200 mg by mouth at bedtime as needed for sleep.    donepezil (ARICEPT) 5 MG tablet Take 5 mg by mouth at bedtime.     LORazepam (ATIVAN) 0.5 MG tablet Take 0.5 mg by mouth at bedtime  as needed.    megestrol (MEGACE) 40 MG/ML suspension Take 800 mg by mouth daily. For appetite       No Known Allergies    The results of significant diagnostics from this hospitalization (including imaging, microbiology, ancillary and laboratory) are listed below for reference.    Significant Diagnostic Studies: Ct Abdomen Pelvis W Contrast  Result Date: 09/05/2016 CLINICAL DATA:  Nausea vomiting and diarrhea. EXAM: CT ABDOMEN AND PELVIS WITH CONTRAST TECHNIQUE: Multidetector CT imaging of the abdomen and pelvis was performed using the standard protocol following bolus administration of intravenous contrast. CONTRAST:  51mL ISOVUE-300 IOPAMIDOL (ISOVUE-300) INJECTION 61% COMPARISON:  Chest radiograph 09/05/2016 FINDINGS: Lower chest: Small right basilar pneumothorax versus incompletely evaluated bullous change in the right lung base. Small left pleural effusion. Hepatobiliary: No focal liver abnormality is seen. No gallstones, gallbladder wall thickening, or biliary dilatation. Pancreas: Unremarkable. No pancreatic ductal dilatation or surrounding inflammatory changes. Spleen: Linear hypoattenuation in the lateral aspect of the spleen, nonspecific. Adrenals/Urinary Tract: Left adrenal mass measures 2.0 by 1.4 cm. Mild thickening of the right adrenal gland. Normal right kidney. Several too small to be accurately characterized hypoattenuated lesions in the left renal cortex. No evidence of hydronephrosis. Stomach/Bowel: Moderate distension of the stomach. Diffuse moderate distension  of small bowel loops throughout the abdomen with maximum diameter of 3.4 Cm. No transitional point is identified. The colon is decompressed and contains normal pockets of gas. Vascular/Lymphatic: Aortic atherosclerosis. No enlarged abdominal or pelvic lymph nodes. Reproductive: Leiomyomatous uterus.  The ovaries are not identified. Other: Small amount of water density fluid throughout the abdomen and pelvis. Musculoskeletal:  Age-indeterminate compression deformity of L5 vertebral body with approximately 35% height loss. This finding is new from 03/12/2013, CT of the abdomen. Disc osteophyte complex at L4-L5. IMPRESSION: Small basilar right pneumothorax versus bullous change in the right lung base, incompletely evaluated. Early/intermittent small bowel obstruction versus ileus. No transitional point identified. Small amount of free fluid in the abdomen. Indeterminate left adrenal mass which measures up to 2 cm. Age-indeterminate compression fracture of L5 vertebral body. These results were called by telephone at the time of interpretation on 09/05/2016 at 7:10 pm to Dr. Julianne Rice , who verbally acknowledged these results. Electronically Signed   By: Fidela Salisbury M.D.   On: 09/05/2016 19:15   Dg Chest Port 1 View  Result Date: 09/07/2016 CLINICAL DATA:  Abnormal CT. EXAM: PORTABLE CHEST 1 VIEW COMPARISON:  09/06/2016, CT abdomen pelvis 08/06/2016 and CT chest 06/14/2016. FINDINGS: Trachea is midline. Heart is enlarged. There may be mild left lower lobe atelectasis adjacent to an elevated left hemidiaphragm. Tiny left pleural effusion. IMPRESSION: 1. There may be developing left lower lobe atelectasis adjacent to an elevated left hemidiaphragm. 2. Probable tiny bilateral pleural effusions. 3. Small amount of pleural air seen on CT abdomen 09/05/2016 is too small to visualize radiographically. Of note, it is stable from 06/14/2016 and chronic. Electronically Signed   By: Lorin Picket M.D.   On: 09/07/2016 10:48   Dg Abd Acute W/chest  Result Date: 09/06/2016 CLINICAL DATA:  Ileus EXAM: DG ABDOMEN ACUTE W/ 1V CHEST COMPARISON:  CT 09/05/2016 FINDINGS: Cardiomegaly.  Bibasilar atelectasis.  No effusions or edema. Mild diffuse gaseous distention of bowel, improved since prior CT, likely mild residual ileus. No organomegaly or free air. IMPRESSION: Mild residual ileus, improved since prior CT. Cardiomegaly, bibasilar  atelectasis. Electronically Signed   By: Rolm Baptise M.D.   On: 09/06/2016 09:45   Dg Abd Acute W/chest  Result Date: 09/05/2016 CLINICAL DATA:  Nausea and vomiting starting yesterday. EXAM: DG ABDOMEN ACUTE W/ 1V CHEST COMPARISON:  Chest x-ray Jun 14, 2016 FINDINGS: There are multiple dilated small bowel loops. Air is identified in the abdomen. No radiopaque calculi or other significant radiographic abnormality is seen. Heart size and mediastinal contours are within normal limits. Small focal opacity is identified in the left lung base probably due to focal atelectasis. IMPRESSION: Findings consistent with developing small bowel obstruction. Electronically Signed   By: Abelardo Diesel M.D.   On: 09/05/2016 18:16    Microbiology: Recent Results (from the past 240 hour(s))  MRSA PCR Screening     Status: None   Collection Time: 09/05/16 10:21 PM  Result Value Ref Range Status   MRSA by PCR NEGATIVE NEGATIVE Final    Comment:        The GeneXpert MRSA Assay (FDA approved for NASAL specimens only), is one component of a comprehensive MRSA colonization surveillance program. It is not intended to diagnose MRSA infection nor to guide or monitor treatment for MRSA infections.      Labs: Basic Metabolic Panel:  Recent Labs Lab 09/05/16 1643 09/06/16 0601  NA 145 141  K 4.9 3.8  CL 106 110  CO2  31 26  GLUCOSE 120* 115*  BUN 17 19  CREATININE 0.73 0.60  CALCIUM 9.2 8.2*   Liver Function Tests:  Recent Labs Lab 09/05/16 1643 09/06/16 0601  AST 32 19  ALT 12* 9*  ALKPHOS 52 39  BILITOT 0.9 0.5  PROT 8.7* 6.3*  ALBUMIN 4.6 3.4*    Recent Labs Lab 09/05/16 1643  LIPASE 26   No results for input(s): AMMONIA in the last 168 hours. CBC:  Recent Labs Lab 09/05/16 1643 09/06/16 0601  WBC 10.4 7.8  HGB 15.5* 12.9  HCT 46.2* 37.9  MCV 90.6 90.2  PLT 172 163   Cardiac Enzymes: No results for input(s): CKTOTAL, CKMB, CKMBINDEX, TROPONINI in the last 168  hours. BNP: BNP (last 3 results)  Recent Labs  06/14/16 1623  BNP 188.0*    ProBNP (last 3 results) No results for input(s): PROBNP in the last 8760 hours.  CBG: No results for input(s): GLUCAP in the last 168 hours.     Signed:  Blakeley Scheier MD.  [facility] 09/07/2016, 1:34 PM

## 2016-09-07 NOTE — Progress Notes (Signed)
Pt discharged home today per Dr. Caryn Section. Pt's IV site D/C'd and WDL. Pt's VSS. Pt's daughter provided with home medication list, discharge instructions and prescriptions. Verbalized understanding. Pt left floor via WC in stable condition accompanied by NT.

## 2016-09-09 DIAGNOSIS — Z87891 Personal history of nicotine dependence: Secondary | ICD-10-CM | POA: Diagnosis not present

## 2016-09-09 DIAGNOSIS — F0390 Unspecified dementia without behavioral disturbance: Secondary | ICD-10-CM | POA: Diagnosis not present

## 2016-09-09 DIAGNOSIS — F209 Schizophrenia, unspecified: Secondary | ICD-10-CM | POA: Diagnosis not present

## 2016-09-09 DIAGNOSIS — F319 Bipolar disorder, unspecified: Secondary | ICD-10-CM | POA: Diagnosis not present

## 2016-09-09 DIAGNOSIS — S32050D Wedge compression fracture of fifth lumbar vertebra, subsequent encounter for fracture with routine healing: Secondary | ICD-10-CM | POA: Diagnosis not present

## 2016-09-09 DIAGNOSIS — I10 Essential (primary) hypertension: Secondary | ICD-10-CM | POA: Diagnosis not present

## 2016-09-10 DIAGNOSIS — I10 Essential (primary) hypertension: Secondary | ICD-10-CM | POA: Diagnosis not present

## 2016-09-10 DIAGNOSIS — Z87891 Personal history of nicotine dependence: Secondary | ICD-10-CM | POA: Diagnosis not present

## 2016-09-10 DIAGNOSIS — F319 Bipolar disorder, unspecified: Secondary | ICD-10-CM | POA: Diagnosis not present

## 2016-09-10 DIAGNOSIS — F0390 Unspecified dementia without behavioral disturbance: Secondary | ICD-10-CM | POA: Diagnosis not present

## 2016-09-10 DIAGNOSIS — F209 Schizophrenia, unspecified: Secondary | ICD-10-CM | POA: Diagnosis not present

## 2016-09-10 DIAGNOSIS — S32050D Wedge compression fracture of fifth lumbar vertebra, subsequent encounter for fracture with routine healing: Secondary | ICD-10-CM | POA: Diagnosis not present

## 2016-09-12 ENCOUNTER — Ambulatory Visit: Payer: Medicare Other | Admitting: Nutrition

## 2016-09-12 DIAGNOSIS — F209 Schizophrenia, unspecified: Secondary | ICD-10-CM | POA: Diagnosis not present

## 2016-09-12 DIAGNOSIS — I10 Essential (primary) hypertension: Secondary | ICD-10-CM | POA: Diagnosis not present

## 2016-09-12 DIAGNOSIS — S32050D Wedge compression fracture of fifth lumbar vertebra, subsequent encounter for fracture with routine healing: Secondary | ICD-10-CM | POA: Diagnosis not present

## 2016-09-12 DIAGNOSIS — F0390 Unspecified dementia without behavioral disturbance: Secondary | ICD-10-CM | POA: Diagnosis not present

## 2016-09-12 DIAGNOSIS — Z87891 Personal history of nicotine dependence: Secondary | ICD-10-CM | POA: Diagnosis not present

## 2016-09-12 DIAGNOSIS — F319 Bipolar disorder, unspecified: Secondary | ICD-10-CM | POA: Diagnosis not present

## 2016-09-18 DIAGNOSIS — J449 Chronic obstructive pulmonary disease, unspecified: Secondary | ICD-10-CM | POA: Diagnosis not present

## 2016-09-18 DIAGNOSIS — R634 Abnormal weight loss: Secondary | ICD-10-CM | POA: Diagnosis not present

## 2016-09-18 DIAGNOSIS — Z681 Body mass index (BMI) 19 or less, adult: Secondary | ICD-10-CM | POA: Diagnosis not present

## 2016-09-18 DIAGNOSIS — R636 Underweight: Secondary | ICD-10-CM | POA: Diagnosis not present

## 2016-09-19 DIAGNOSIS — S32050D Wedge compression fracture of fifth lumbar vertebra, subsequent encounter for fracture with routine healing: Secondary | ICD-10-CM | POA: Diagnosis not present

## 2016-09-19 DIAGNOSIS — F0390 Unspecified dementia without behavioral disturbance: Secondary | ICD-10-CM | POA: Diagnosis not present

## 2016-09-19 DIAGNOSIS — Z87891 Personal history of nicotine dependence: Secondary | ICD-10-CM | POA: Diagnosis not present

## 2016-09-19 DIAGNOSIS — I10 Essential (primary) hypertension: Secondary | ICD-10-CM | POA: Diagnosis not present

## 2016-09-19 DIAGNOSIS — F209 Schizophrenia, unspecified: Secondary | ICD-10-CM | POA: Diagnosis not present

## 2016-09-19 DIAGNOSIS — F319 Bipolar disorder, unspecified: Secondary | ICD-10-CM | POA: Diagnosis not present

## 2016-10-03 DIAGNOSIS — S32050D Wedge compression fracture of fifth lumbar vertebra, subsequent encounter for fracture with routine healing: Secondary | ICD-10-CM | POA: Diagnosis not present

## 2016-10-03 DIAGNOSIS — F319 Bipolar disorder, unspecified: Secondary | ICD-10-CM | POA: Diagnosis not present

## 2016-10-03 DIAGNOSIS — F209 Schizophrenia, unspecified: Secondary | ICD-10-CM | POA: Diagnosis not present

## 2016-10-03 DIAGNOSIS — I10 Essential (primary) hypertension: Secondary | ICD-10-CM | POA: Diagnosis not present

## 2016-10-03 DIAGNOSIS — Z87891 Personal history of nicotine dependence: Secondary | ICD-10-CM | POA: Diagnosis not present

## 2016-10-03 DIAGNOSIS — F0390 Unspecified dementia without behavioral disturbance: Secondary | ICD-10-CM | POA: Diagnosis not present

## 2016-10-16 DIAGNOSIS — S32050D Wedge compression fracture of fifth lumbar vertebra, subsequent encounter for fracture with routine healing: Secondary | ICD-10-CM | POA: Diagnosis not present

## 2016-10-16 DIAGNOSIS — F0390 Unspecified dementia without behavioral disturbance: Secondary | ICD-10-CM | POA: Diagnosis not present

## 2016-10-16 DIAGNOSIS — I10 Essential (primary) hypertension: Secondary | ICD-10-CM | POA: Diagnosis not present

## 2016-10-16 DIAGNOSIS — F319 Bipolar disorder, unspecified: Secondary | ICD-10-CM | POA: Diagnosis not present

## 2016-10-16 DIAGNOSIS — Z87891 Personal history of nicotine dependence: Secondary | ICD-10-CM | POA: Diagnosis not present

## 2016-10-16 DIAGNOSIS — F209 Schizophrenia, unspecified: Secondary | ICD-10-CM | POA: Diagnosis not present

## 2016-10-30 DIAGNOSIS — Z87891 Personal history of nicotine dependence: Secondary | ICD-10-CM | POA: Diagnosis not present

## 2016-10-30 DIAGNOSIS — F319 Bipolar disorder, unspecified: Secondary | ICD-10-CM | POA: Diagnosis not present

## 2016-10-30 DIAGNOSIS — S32050D Wedge compression fracture of fifth lumbar vertebra, subsequent encounter for fracture with routine healing: Secondary | ICD-10-CM | POA: Diagnosis not present

## 2016-10-30 DIAGNOSIS — F209 Schizophrenia, unspecified: Secondary | ICD-10-CM | POA: Diagnosis not present

## 2016-10-30 DIAGNOSIS — F0390 Unspecified dementia without behavioral disturbance: Secondary | ICD-10-CM | POA: Diagnosis not present

## 2016-10-30 DIAGNOSIS — I10 Essential (primary) hypertension: Secondary | ICD-10-CM | POA: Diagnosis not present

## 2016-12-23 ENCOUNTER — Inpatient Hospital Stay (HOSPITAL_COMMUNITY)
Admission: EM | Admit: 2016-12-23 | Discharge: 2016-12-28 | DRG: 329 | Disposition: A | Discharge status: Home Health | Payer: Medicare Other | Attending: Family Medicine | Admitting: Family Medicine

## 2016-12-23 ENCOUNTER — Inpatient Hospital Stay (HOSPITAL_COMMUNITY): Payer: Medicare Other | Admitting: Anesthesiology

## 2016-12-23 ENCOUNTER — Emergency Department (HOSPITAL_COMMUNITY): Payer: Medicare Other

## 2016-12-23 ENCOUNTER — Inpatient Hospital Stay (HOSPITAL_COMMUNITY): Payer: Medicare Other

## 2016-12-23 ENCOUNTER — Encounter (HOSPITAL_COMMUNITY): Payer: Self-pay | Admitting: *Deleted

## 2016-12-23 ENCOUNTER — Other Ambulatory Visit: Payer: Self-pay

## 2016-12-23 ENCOUNTER — Encounter (HOSPITAL_COMMUNITY)
Admission: EM | Disposition: A | Discharge status: Home Health | Payer: Self-pay | Source: Home / Self Care | Attending: Internal Medicine

## 2016-12-23 DIAGNOSIS — R54 Age-related physical debility: Secondary | ICD-10-CM | POA: Diagnosis present

## 2016-12-23 DIAGNOSIS — E43 Unspecified severe protein-calorie malnutrition: Secondary | ICD-10-CM | POA: Diagnosis present

## 2016-12-23 DIAGNOSIS — Z681 Body mass index (BMI) 19 or less, adult: Secondary | ICD-10-CM | POA: Diagnosis not present

## 2016-12-23 DIAGNOSIS — D72829 Elevated white blood cell count, unspecified: Secondary | ICD-10-CM | POA: Diagnosis present

## 2016-12-23 DIAGNOSIS — R102 Pelvic and perineal pain: Secondary | ICD-10-CM | POA: Diagnosis present

## 2016-12-23 DIAGNOSIS — K56609 Unspecified intestinal obstruction, unspecified as to partial versus complete obstruction: Secondary | ICD-10-CM | POA: Diagnosis not present

## 2016-12-23 DIAGNOSIS — I160 Hypertensive urgency: Secondary | ICD-10-CM | POA: Diagnosis present

## 2016-12-23 DIAGNOSIS — Z01811 Encounter for preprocedural respiratory examination: Secondary | ICD-10-CM

## 2016-12-23 DIAGNOSIS — E785 Hyperlipidemia, unspecified: Secondary | ICD-10-CM | POA: Diagnosis present

## 2016-12-23 DIAGNOSIS — K458 Other specified abdominal hernia without obstruction or gangrene: Secondary | ICD-10-CM | POA: Diagnosis not present

## 2016-12-23 DIAGNOSIS — F0391 Unspecified dementia with behavioral disturbance: Secondary | ICD-10-CM | POA: Diagnosis present

## 2016-12-23 DIAGNOSIS — Z87891 Personal history of nicotine dependence: Secondary | ICD-10-CM

## 2016-12-23 DIAGNOSIS — I10 Essential (primary) hypertension: Secondary | ICD-10-CM | POA: Diagnosis present

## 2016-12-23 DIAGNOSIS — K559 Vascular disorder of intestine, unspecified: Secondary | ICD-10-CM

## 2016-12-23 DIAGNOSIS — K56699 Other intestinal obstruction unspecified as to partial versus complete obstruction: Secondary | ICD-10-CM | POA: Diagnosis not present

## 2016-12-23 DIAGNOSIS — E876 Hypokalemia: Secondary | ICD-10-CM

## 2016-12-23 DIAGNOSIS — F209 Schizophrenia, unspecified: Secondary | ICD-10-CM | POA: Diagnosis present

## 2016-12-23 DIAGNOSIS — F2089 Other schizophrenia: Secondary | ICD-10-CM | POA: Diagnosis not present

## 2016-12-23 DIAGNOSIS — R918 Other nonspecific abnormal finding of lung field: Secondary | ICD-10-CM | POA: Diagnosis not present

## 2016-12-23 DIAGNOSIS — F0390 Unspecified dementia without behavioral disturbance: Secondary | ICD-10-CM | POA: Diagnosis present

## 2016-12-23 DIAGNOSIS — R64 Cachexia: Secondary | ICD-10-CM | POA: Diagnosis present

## 2016-12-23 DIAGNOSIS — F319 Bipolar disorder, unspecified: Secondary | ICD-10-CM | POA: Diagnosis present

## 2016-12-23 DIAGNOSIS — Z01818 Encounter for other preprocedural examination: Secondary | ICD-10-CM

## 2016-12-23 DIAGNOSIS — K450 Other specified abdominal hernia with obstruction, without gangrene: Secondary | ICD-10-CM | POA: Diagnosis present

## 2016-12-23 DIAGNOSIS — Z79899 Other long term (current) drug therapy: Secondary | ICD-10-CM | POA: Diagnosis not present

## 2016-12-23 DIAGNOSIS — N281 Cyst of kidney, acquired: Secondary | ICD-10-CM | POA: Diagnosis not present

## 2016-12-23 HISTORY — PX: BOWEL RESECTION: SHX1257

## 2016-12-23 HISTORY — PX: LAPAROTOMY: SHX154

## 2016-12-23 LAB — CBC WITH DIFFERENTIAL/PLATELET
BASOS ABS: 0 10*3/uL (ref 0.0–0.1)
Basophils Relative: 0 %
Eosinophils Absolute: 0 10*3/uL (ref 0.0–0.7)
Eosinophils Relative: 0 %
HEMATOCRIT: 39.2 % (ref 36.0–46.0)
HEMOGLOBIN: 13.5 g/dL (ref 12.0–15.0)
LYMPHS PCT: 10 %
Lymphs Abs: 0.7 10*3/uL (ref 0.7–4.0)
MCH: 30.4 pg (ref 26.0–34.0)
MCHC: 34.4 g/dL (ref 30.0–36.0)
MCV: 88.3 fL (ref 78.0–100.0)
MONO ABS: 0.5 10*3/uL (ref 0.1–1.0)
Monocytes Relative: 7 %
NEUTROS ABS: 5.8 10*3/uL (ref 1.7–7.7)
NEUTROS PCT: 83 %
Platelets: 238 10*3/uL (ref 150–400)
RBC: 4.44 MIL/uL (ref 3.87–5.11)
RDW: 13.5 % (ref 11.5–15.5)
WBC: 7 10*3/uL (ref 4.0–10.5)

## 2016-12-23 LAB — COMPREHENSIVE METABOLIC PANEL
ALT: 12 U/L — AB (ref 14–54)
AST: 23 U/L (ref 15–41)
Albumin: 4 g/dL (ref 3.5–5.0)
Alkaline Phosphatase: 53 U/L (ref 38–126)
Anion gap: 7 (ref 5–15)
BILIRUBIN TOTAL: 0.6 mg/dL (ref 0.3–1.2)
BUN: 17 mg/dL (ref 6–20)
CO2: 26 mmol/L (ref 22–32)
CREATININE: 0.56 mg/dL (ref 0.44–1.00)
Calcium: 9.2 mg/dL (ref 8.9–10.3)
Chloride: 107 mmol/L (ref 101–111)
GFR calc Af Amer: >60 mL/min (ref >60)
Glucose, Bld: 115 mg/dL — ABNORMAL HIGH (ref 65–99)
Potassium: 3.4 mmol/L — ABNORMAL LOW (ref 3.5–5.1)
Sodium: 140 mmol/L (ref 135–145)
TOTAL PROTEIN: 7.3 g/dL (ref 6.5–8.1)

## 2016-12-23 LAB — RAPID URINE DRUG SCREEN, HOSP PERFORMED
Amphetamines: NOT DETECTED
Barbiturates: NOT DETECTED
Benzodiazepines: NOT DETECTED
COCAINE: NOT DETECTED
OPIATES: NOT DETECTED
TETRAHYDROCANNABINOL: NOT DETECTED

## 2016-12-23 LAB — URINALYSIS, ROUTINE W REFLEX MICROSCOPIC
Bilirubin Urine: NEGATIVE
GLUCOSE, UA: NEGATIVE mg/dL
Hgb urine dipstick: NEGATIVE
Ketones, ur: NEGATIVE mg/dL
Leukocytes, UA: NEGATIVE
Nitrite: NEGATIVE
PROTEIN: 100 mg/dL — AB
Specific Gravity, Urine: 1.014 (ref 1.005–1.030)
pH: 7 (ref 5.0–8.0)

## 2016-12-23 LAB — LIPASE, BLOOD: LIPASE: 28 U/L (ref 11–51)

## 2016-12-23 IMAGING — CR DG CHEST 1V PORT
1 series · 1 of 1 positions shown · non-contrast
Comparison: 12/01/2013

CLINICAL DATA: Pt c/o of mid/Lt sided, non radiating CP today.
Denies any other symptoms at this time. Patient is unable to answer
questions. Combative.

EXAM:
PORTABLE CHEST 1 VIEW

[ap]
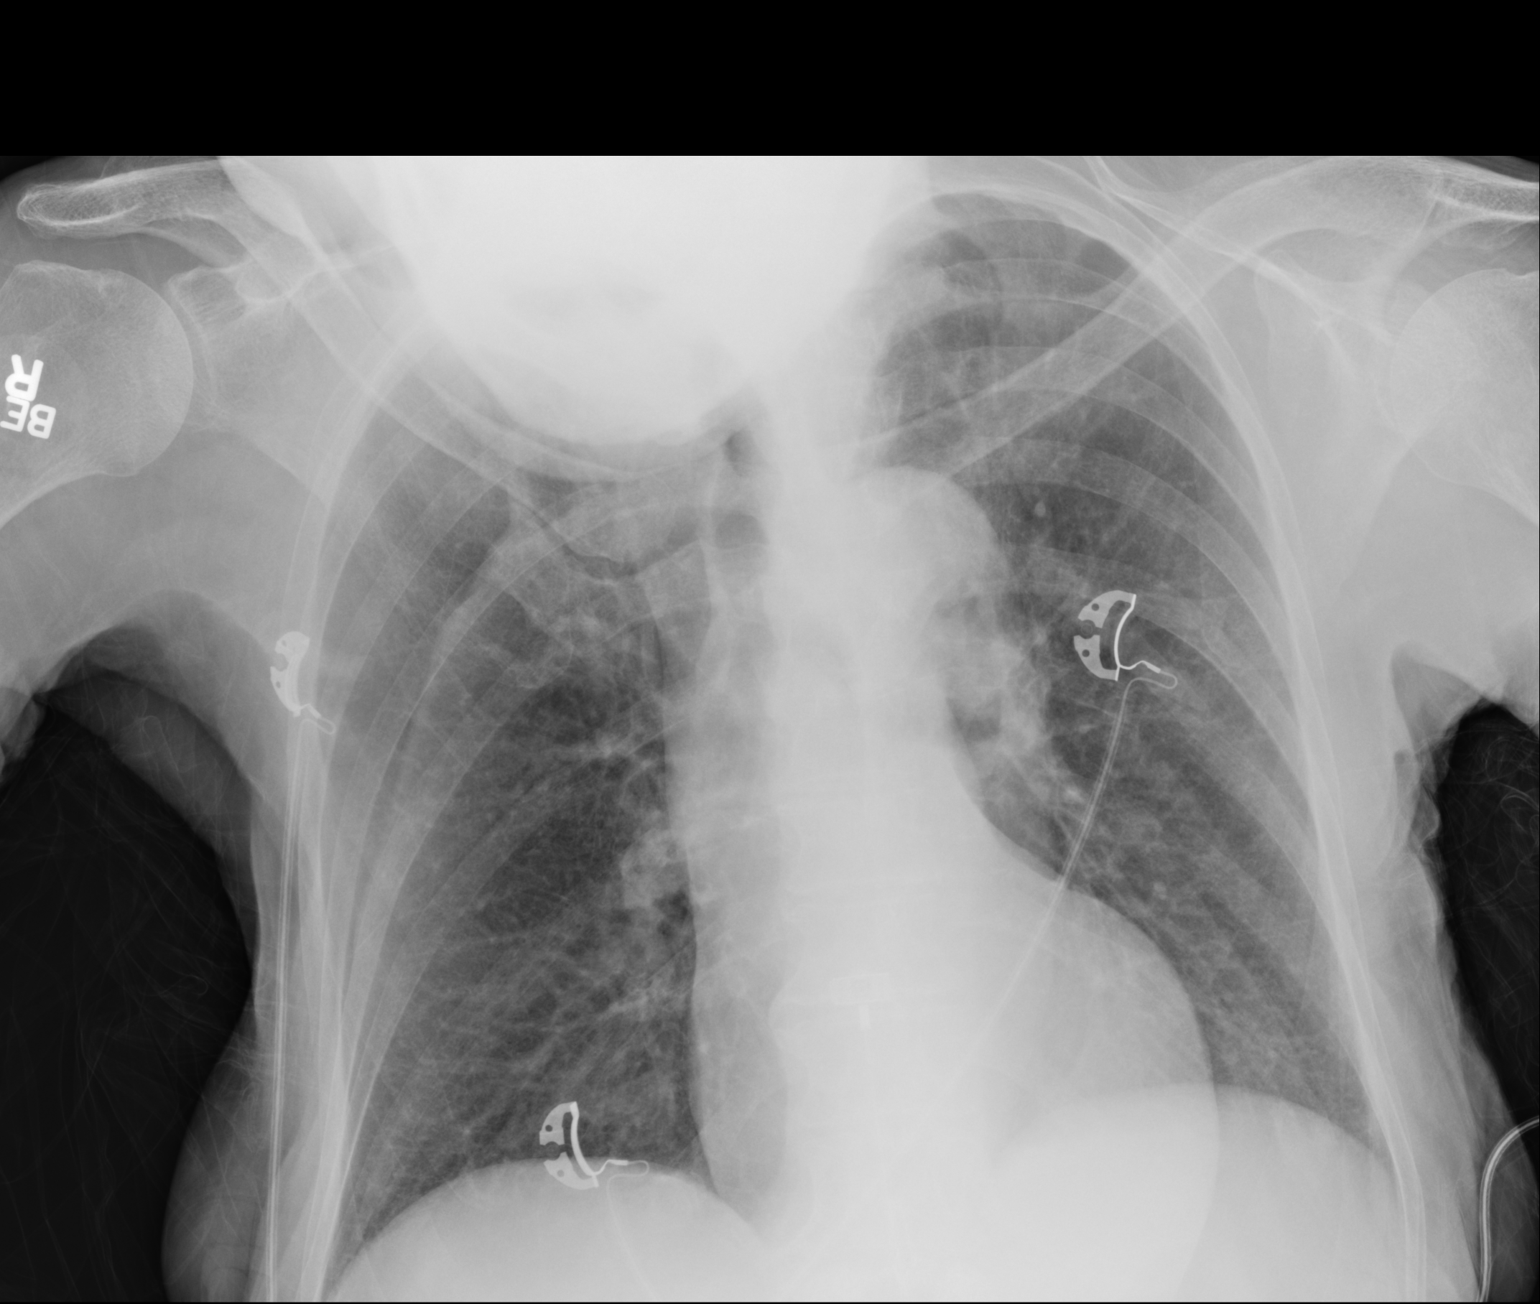

[1 of 1 positions shown; findings below may reference images not displayed]

FINDINGS: Heart size accentuated by the technique. Lungs are clear. The
patient's chin obscures the upper portion of the chest. No evidence
for pulmonary edema.
IMPRESSION: No evidence for acute cardiopulmonary abnormality.

## 2016-12-23 SURGERY — LAPAROTOMY, EXPLORATORY
Anesthesia: General | Site: Abdomen

## 2016-12-23 MED ORDER — SUCCINYLCHOLINE CHLORIDE 200 MG/10ML IV SOSY
PREFILLED_SYRINGE | INTRAVENOUS | Status: DC | PRN
  Administered 2016-12-23: 100 mg via INTRAVENOUS

## 2016-12-23 MED ORDER — LIDOCAINE HCL (PF) 1 % IJ SOLN
INTRAMUSCULAR | Status: AC
  Filled 2016-12-23: qty 5

## 2016-12-23 MED ORDER — LACTATED RINGERS IV SOLN
INTRAVENOUS | Status: DC
  Administered 2016-12-23 (×2): via INTRAVENOUS

## 2016-12-23 MED ORDER — METOPROLOL TARTRATE 5 MG/5ML IV SOLN
2.5000 mg | Freq: Four times a day (QID) | INTRAVENOUS | Status: DC
  Administered 2016-12-23 – 2016-12-24 (×4): 2.5 mg via INTRAVENOUS
  Filled 2016-12-23 (×4): qty 5

## 2016-12-23 MED ORDER — HYDRALAZINE HCL 20 MG/ML IJ SOLN
INTRAMUSCULAR | Status: DC | PRN
  Administered 2016-12-23: 4 mg via INTRAVENOUS

## 2016-12-23 MED ORDER — HYDRALAZINE HCL 20 MG/ML IJ SOLN
INTRAMUSCULAR | Status: AC
  Filled 2016-12-23: qty 1

## 2016-12-23 MED ORDER — CHLORHEXIDINE GLUCONATE CLOTH 2 % EX PADS: 6.0000 | MEDICATED_PAD | Freq: Once | CUTANEOUS | Status: DC

## 2016-12-23 MED ORDER — FENTANYL CITRATE (PF) 250 MCG/5ML IJ SOLN
INTRAMUSCULAR | Status: AC
  Filled 2016-12-23: qty 5

## 2016-12-23 MED ORDER — ONDANSETRON HCL 4 MG/2ML IJ SOLN: 4.0000 mg | Freq: Four times a day (QID) | INTRAMUSCULAR | Status: DC | PRN

## 2016-12-23 MED ORDER — CEFOTETAN DISODIUM-DEXTROSE 2-2.08 GM-%(50ML) IV SOLR
2.0000 g | INTRAVENOUS | Status: AC
  Administered 2016-12-23: 2 g via INTRAVENOUS
  Filled 2016-12-23: qty 50

## 2016-12-23 MED ORDER — SUCCINYLCHOLINE CHLORIDE 20 MG/ML IJ SOLN
INTRAMUSCULAR | Status: AC
  Filled 2016-12-23: qty 1

## 2016-12-23 MED ORDER — HALOPERIDOL LACTATE 5 MG/ML IJ SOLN
5.0000 mg | Freq: Once | INTRAMUSCULAR | Status: AC
  Administered 2016-12-23: 5 mg via INTRAVENOUS
  Filled 2016-12-23: qty 1

## 2016-12-23 MED ORDER — LIDOCAINE HCL (CARDIAC) 20 MG/ML IV SOLN
INTRAVENOUS | Status: DC | PRN
  Administered 2016-12-23: 40 mg via INTRAVENOUS

## 2016-12-23 MED ORDER — ROCURONIUM BROMIDE 50 MG/5ML IV SOLN
INTRAVENOUS | Status: AC
  Filled 2016-12-23: qty 1

## 2016-12-23 MED ORDER — BUPIVACAINE LIPOSOME 1.3 % IJ SUSP
INTRAMUSCULAR | Status: DC | PRN
  Administered 2016-12-23: 20 mL

## 2016-12-23 MED ORDER — PHENYLEPHRINE HCL 10 MG/ML IJ SOLN
INTRAMUSCULAR | Status: DC | PRN
  Administered 2016-12-23 (×3): 80 ug via INTRAVENOUS

## 2016-12-23 MED ORDER — NEOSTIGMINE METHYLSULFATE 10 MG/10ML IV SOLN
INTRAVENOUS | Status: DC | PRN
  Administered 2016-12-23: 3 mg via INTRAVENOUS

## 2016-12-23 MED ORDER — IOPAMIDOL (ISOVUE-300) INJECTION 61%
100.0000 mL | Freq: Once | INTRAVENOUS | Status: AC | PRN
  Administered 2016-12-23: 100 mL via INTRAVENOUS

## 2016-12-23 MED ORDER — LORAZEPAM 2 MG/ML IJ SOLN
1.0000 mg | Freq: Once | INTRAMUSCULAR | Status: AC
  Administered 2016-12-23: 1 mg via INTRAVENOUS
  Filled 2016-12-23: qty 1

## 2016-12-23 MED ORDER — HYDRALAZINE HCL 20 MG/ML IJ SOLN
10.0000 mg | Freq: Four times a day (QID) | INTRAMUSCULAR | Status: DC | PRN
  Administered 2016-12-23 (×2): 2 mg via INTRAVENOUS
  Administered 2016-12-24: 10 mg via INTRAVENOUS
  Filled 2016-12-23: qty 1

## 2016-12-23 MED ORDER — GLYCOPYRROLATE 0.2 MG/ML IJ SOLN
INTRAMUSCULAR | Status: DC | PRN
  Administered 2016-12-23: .5 mg via INTRAVENOUS

## 2016-12-23 MED ORDER — EPHEDRINE SULFATE 50 MG/ML IJ SOLN
INTRAMUSCULAR | Status: DC | PRN
  Administered 2016-12-23: 10 mg via INTRAVENOUS

## 2016-12-23 MED ORDER — ENOXAPARIN SODIUM 30 MG/0.3ML ~~LOC~~ SOLN
30.0000 mg | SUBCUTANEOUS | Status: DC
  Administered 2016-12-24 – 2016-12-27 (×4): 30 mg via SUBCUTANEOUS
  Filled 2016-12-23 (×3): qty 0.3

## 2016-12-23 MED ORDER — ETOMIDATE 2 MG/ML IV SOLN
INTRAVENOUS | Status: DC | PRN
  Administered 2016-12-23: 10 mg via INTRAVENOUS

## 2016-12-23 MED ORDER — PROPOFOL 10 MG/ML IV BOLUS
INTRAVENOUS | Status: AC
  Filled 2016-12-23: qty 20

## 2016-12-23 MED ORDER — FENTANYL CITRATE (PF) 100 MCG/2ML IJ SOLN: 25.0000 ug | INTRAMUSCULAR | Status: DC | PRN

## 2016-12-23 MED ORDER — MIDAZOLAM HCL 2 MG/2ML IJ SOLN: 0.5000 mg | Freq: Once | INTRAMUSCULAR | Status: DC

## 2016-12-23 MED ORDER — SODIUM CHLORIDE 0.9 % IV SOLN
INTRAVENOUS | Status: DC
Start: 2016-12-23 — End: 2016-12-28
  Administered 2016-12-23 – 2016-12-27 (×10): via INTRAVENOUS

## 2016-12-23 MED ORDER — ONDANSETRON HCL 4 MG PO TABS
4.0000 mg | ORAL_TABLET | Freq: Four times a day (QID) | ORAL | Status: DC | PRN
  Filled 2016-12-23: qty 1

## 2016-12-23 MED ORDER — POTASSIUM CHLORIDE 10 MEQ/100ML IV SOLN
10.0000 meq | INTRAVENOUS | Status: DC
  Administered 2016-12-23: 10 meq via INTRAVENOUS

## 2016-12-23 MED ORDER — ARTIFICIAL TEARS OPHTHALMIC OINT
TOPICAL_OINTMENT | OPHTHALMIC | Status: AC
  Filled 2016-12-23: qty 3.5

## 2016-12-23 MED ORDER — PHENYLEPHRINE 40 MCG/ML (10ML) SYRINGE FOR IV PUSH (FOR BLOOD PRESSURE SUPPORT)
PREFILLED_SYRINGE | INTRAVENOUS | Status: AC
  Filled 2016-12-23: qty 10

## 2016-12-23 MED ORDER — ACETAMINOPHEN 650 MG RE SUPP
650.0000 mg | Freq: Four times a day (QID) | RECTAL | Status: DC | PRN
  Filled 2016-12-23: qty 1

## 2016-12-23 MED ORDER — POVIDONE-IODINE 10 % EX OINT
TOPICAL_OINTMENT | CUTANEOUS | Status: AC
  Filled 2016-12-23: qty 1

## 2016-12-23 MED ORDER — LIDOCAINE HCL (PF) 1 % IJ SOLN
INTRAMUSCULAR | Status: AC
  Filled 2016-12-23: qty 10

## 2016-12-23 MED ORDER — ETOMIDATE 2 MG/ML IV SOLN
INTRAVENOUS | Status: AC
  Filled 2016-12-23: qty 10

## 2016-12-23 MED ORDER — POTASSIUM CHLORIDE 10 MEQ/100ML IV SOLN
10.0000 meq | INTRAVENOUS | Status: AC
  Administered 2016-12-23: 10 meq via INTRAVENOUS
  Filled 2016-12-23 (×2): qty 100

## 2016-12-23 MED ORDER — POTASSIUM CHLORIDE IN NACL 20-0.9 MEQ/L-% IV SOLN
INTRAVENOUS | Status: DC
  Filled 2016-12-23 (×3): qty 1000

## 2016-12-23 MED ORDER — HALOPERIDOL LACTATE 5 MG/ML IJ SOLN
5.0000 mg | Freq: Four times a day (QID) | INTRAMUSCULAR | Status: DC | PRN
  Administered 2016-12-24 – 2016-12-26 (×4): 5 mg via INTRAVENOUS
  Filled 2016-12-23 (×5): qty 1

## 2016-12-23 MED ORDER — HALOPERIDOL LACTATE 5 MG/ML IJ SOLN
5.0000 mg | Freq: Every day | INTRAMUSCULAR | Status: DC
  Administered 2016-12-23 – 2016-12-27 (×4): 5 mg via INTRAVENOUS
  Filled 2016-12-23 (×5): qty 1

## 2016-12-23 MED ORDER — IOPAMIDOL (ISOVUE-300) INJECTION 61%
INTRAVENOUS | Status: AC
  Administered 2016-12-23: 30 mL
  Filled 2016-12-23: qty 30

## 2016-12-23 MED ORDER — SODIUM CHLORIDE 0.9 % IR SOLN
Status: DC | PRN
  Administered 2016-12-23: 2000 mL

## 2016-12-23 MED ORDER — FENTANYL CITRATE (PF) 100 MCG/2ML IJ SOLN
INTRAMUSCULAR | Status: DC | PRN
  Administered 2016-12-23: 25 ug via INTRAVENOUS
  Administered 2016-12-23: 50 ug via INTRAVENOUS
  Administered 2016-12-23: 25 ug via INTRAVENOUS
  Administered 2016-12-23: 50 ug via INTRAVENOUS

## 2016-12-23 MED ORDER — BUPIVACAINE LIPOSOME 1.3 % IJ SUSP
INTRAMUSCULAR | Status: AC
  Filled 2016-12-23: qty 20

## 2016-12-23 MED ORDER — CLONIDINE HCL 0.1 MG/24HR TD PTWK
0.1000 mg | MEDICATED_PATCH | TRANSDERMAL | Status: DC
  Administered 2016-12-23: 0.1 mg via TRANSDERMAL
  Filled 2016-12-23: qty 1

## 2016-12-23 MED ORDER — MORPHINE SULFATE (PF) 2 MG/ML IV SOLN
2.0000 mg | INTRAVENOUS | Status: DC | PRN
  Administered 2016-12-23 – 2016-12-24 (×7): 2 mg via INTRAVENOUS
  Filled 2016-12-23 (×8): qty 1

## 2016-12-23 MED ORDER — ACETAMINOPHEN 325 MG PO TABS: 650.0000 mg | ORAL_TABLET | Freq: Four times a day (QID) | ORAL | Status: DC | PRN

## 2016-12-23 MED ORDER — LORAZEPAM 1 MG PO TABS
1.0000 mg | ORAL_TABLET | Freq: Once | ORAL | Status: AC
  Administered 2016-12-23: 1 mg via ORAL
  Filled 2016-12-23: qty 1

## 2016-12-23 MED ORDER — GLYCOPYRROLATE 0.2 MG/ML IJ SOLN
INTRAMUSCULAR | Status: AC
  Filled 2016-12-23: qty 2

## 2016-12-23 MED ORDER — EPHEDRINE SULFATE 50 MG/ML IJ SOLN
INTRAMUSCULAR | Status: AC
  Filled 2016-12-23: qty 1

## 2016-12-23 MED ORDER — ROCURONIUM BROMIDE 100 MG/10ML IV SOLN
INTRAVENOUS | Status: DC | PRN
  Administered 2016-12-23: 25 mg via INTRAVENOUS
  Administered 2016-12-23: 5 mg via INTRAVENOUS
  Administered 2016-12-23: 10 mg via INTRAVENOUS

## 2016-12-23 MED ORDER — SODIUM CHLORIDE 0.9 % IJ SOLN
INTRAMUSCULAR | Status: AC
  Filled 2016-12-23: qty 10

## 2016-12-23 SURGICAL SUPPLY — 43 items
BAG HAMPER (MISCELLANEOUS) ×4 IMPLANT
CELLS DAT CNTRL 66122 CELL SVR (MISCELLANEOUS) ×2 IMPLANT
CHLORAPREP W/TINT 26ML (MISCELLANEOUS) ×4 IMPLANT
CLOTH BEACON ORANGE TIMEOUT ST (SAFETY) ×4 IMPLANT
COVER LIGHT HANDLE STERIS (MISCELLANEOUS) ×8 IMPLANT
DRAPE WARM FLUID 44X44 (DRAPE) ×4 IMPLANT
DRSG OPSITE POSTOP 4X10 (GAUZE/BANDAGES/DRESSINGS) ×4 IMPLANT
ELECT REM PT RETURN 9FT ADLT (ELECTROSURGICAL) ×4
ELECTRODE REM PT RTRN 9FT ADLT (ELECTROSURGICAL) ×2 IMPLANT
GLOVE BIO SURGEON STRL SZ 6.5 (GLOVE) ×5 IMPLANT
GLOVE BIO SURGEONS STRL SZ 6.5 (GLOVE) ×3
GLOVE BIOGEL PI IND STRL 6.5 (GLOVE) ×2 IMPLANT
GLOVE BIOGEL PI IND STRL 7.0 (GLOVE) ×8 IMPLANT
GLOVE BIOGEL PI INDICATOR 6.5 (GLOVE) ×8
GLOVE BIOGEL PI INDICATOR 7.0 (GLOVE) ×8
GLOVE SURG SS PI 7.5 STRL IVOR (GLOVE) ×6 IMPLANT
GOWN STRL REUS W/TWL LRG LVL3 (GOWN DISPOSABLE) ×18 IMPLANT
HANDLE SUCTION POOLE (INSTRUMENTS) ×2 IMPLANT
INST SET MAJOR GENERAL (KITS) ×4 IMPLANT
KIT ROOM TURNOVER APOR (KITS) ×4 IMPLANT
LIGASURE IMPACT 36 18CM CVD LR (INSTRUMENTS) ×4 IMPLANT
MANIFOLD NEPTUNE II (INSTRUMENTS) ×4 IMPLANT
NDL HYPO 18GX1.5 BLUNT FILL (NEEDLE) ×1 IMPLANT
NEEDLE HYPO 18GX1.5 BLUNT FILL (NEEDLE) ×4 IMPLANT
NS IRRIG 1000ML POUR BTL (IV SOLUTION) ×8 IMPLANT
PACK ABDOMINAL MAJOR (CUSTOM PROCEDURE TRAY) ×4 IMPLANT
PAD ARMBOARD 7.5X6 YLW CONV (MISCELLANEOUS) ×4 IMPLANT
RELOAD LINEAR CUT PROX 55 BLUE (ENDOMECHANICALS) ×8 IMPLANT
RELOAD STAPLE 55 3.8 BLU REG (ENDOMECHANICALS) IMPLANT
RETRACTOR WND ALEXIS 18 MED (MISCELLANEOUS) ×2 IMPLANT
RTRCTR WOUND ALEXIS 18CM MED (MISCELLANEOUS) ×4
SET BASIN LINEN APH (SET/KITS/TRAYS/PACK) ×4 IMPLANT
SPONGE LAP 18X18 X RAY DECT (DISPOSABLE) ×4 IMPLANT
STAPLER GUN LINEAR PROX 60 (STAPLE) ×3 IMPLANT
STAPLER PROXIMATE 55 BLUE (STAPLE) ×3 IMPLANT
STAPLER VISISTAT (STAPLE) ×4 IMPLANT
SUCTION POOLE HANDLE (INSTRUMENTS) ×4
SUT PDS AB CT VIOLET #0 27IN (SUTURE) ×4 IMPLANT
SUT SILK 2 0 (SUTURE) ×4
SUT SILK 2-0 18XBRD TIE 12 (SUTURE) ×2 IMPLANT
SUT SILK 3 0 SH CR/8 (SUTURE) ×2 IMPLANT
SYR 20CC LL (SYRINGE) ×4 IMPLANT
TRAY FOLEY CATH SILVER 16FR (SET/KITS/TRAYS/PACK) ×4 IMPLANT

## 2016-12-23 NOTE — H&P (Signed)
History and Physical  Brittney Wall AXK:[id number] DOB: May 12, 1945 DOA: 12/23/2016   PCP: [name], MD   Patient coming from: Home  Chief Complaint: N/V abd pain  HPI:  Brittney Wall is a 71 y.o. female with medical history of hypertension, dementia, bipolar disorder, schizophrenia, and hyperlipidemia presenting with 1 day history of lower abdominal pain with associated nausea and vomiting x2.  Due to the patient's dementia, she is unable to provide any history.  All of history is obtained from review of the chart and the patient's daughter at the bedside.  There is no reports of fevers, chills, headache, chest pain, shortness breath, diarrhea, hematochezia, melena, hematemesis.  The patient has not been started on any new medications.  The patient was also complaining of some vaginal pain.  There is no vaginal discharge or bleeding her daughter examined her perineum, she stated it was unremarkable.  Because of her abdominal pain and vomiting, the patient was brought to the emergency department for further evaluation  The patient was afebrile hemodynamically stable saturating 100% on room air.  BMP, LFTs, CBC were unremarkable in the emergency department.  CT of the abdomen and pelvis revealed a small focal obturator hernia on the right involving the mid to distal ileum with distal decompression suggestive of SBO secondary to obturator hernia.  General surgery was consulted.  Assessment/Plan: Small bowel obstruction -N.p.o. -General surgery consult -IV fluids -As needed antiemetics -Pain control -Attempt to convert essential medications to IV  Hypertensive urgency -The patient did not receive all her antihypertensive medications on December 22, 2016 -Daughter notes that the patient vomited soon after receiving her evening meds -IV Lopressor -Hydralazine as needed SBP >180 -Place clonidine patch as the patient is on home clonidine dose 0.1 mg 3 times daily  Dementia with  behavioral disturbance -Haldol as needed agitation -Restart Aricept when able to tolerate po  Schizophrenia -Continue Haldol at bedtime--convert to IV  Hypokalemia -Replete -Check magnesium  Severe protein calorie malnutrition -Start supplements when able to take po        Past Medical History:  Diagnosis Date  . Bipolar 1 disorder ([place])   . Dementia   . Hypertension   . Left adrenal mass ([place]) 09/06/2016  . Pneumothorax on right 09/07/2016  . Schizophrenia [facility])    Past Surgical History:  Procedure Laterality Date  . ortho procedure     Social History:  reports that she has quit smoking. Her smoking use included cigarettes. She smoked 0.50 packs per day. she has never used smokeless tobacco. She reports that she does not drink alcohol or use drugs.   History reviewed. No pertinent family history.   No Known Allergies   Prior to Admission medications   Medication Sig Start Date End Date Taking? Authorizing Provider  cloNIDine (CATAPRES) 0.1 MG tablet Take 0.1 mg by mouth 3 (three) times daily.      [provider]  donepezil (ARICEPT) 5 MG tablet Take 5 mg by mouth at bedtime.  02/16/15   [provider]  haloperidol (HALDOL) 5 MG tablet Take 5 mg by mouth at bedtime.  02/16/15   [provider]  hydrALAZINE (APRESOLINE) 10 MG tablet Take 2 tablets (20 mg total) by mouth at bedtime. 09/07/16   Rexene Alberts, MD  LORazepam (ATIVAN) 0.5 MG tablet Take 0.5 mg by mouth at bedtime as needed. 07/19/16   [provider]  megestrol (MEGACE) 40 MG/ML suspension Take 800 mg by mouth daily.  For appetite    [provider]  minoxidil (LONITEN) 2.5 MG tablet Take 2 tablets (5 mg total) by mouth 2 (two) times daily. 03/17/15   Donne Hazel, MD  mirtazapine (REMERON) 30 MG tablet Take 30 mg by mouth at bedtime.      [provider]  pravastatin (PRAVACHOL) 20 MG tablet Take 20 mg by mouth at bedtime.     [provider]    senna-docusate (SENOKOT-S) 8.6-50 MG tablet Take 1 tablet by mouth at bedtime. 09/07/16   Rexene Alberts, MD  traZODone (DESYREL) 100 MG tablet Take 200 mg by mouth at bedtime as needed for sleep.    [provider]    Review of Systems:  Unobtainable secondary to patient's mental status  Physical Exam: Vitals:   12/23/16 0201 12/23/16 0203 12/23/16 0347  BP:  (!) 244/95 (!) 178/87  Pulse:  65 72  Resp:  16 18  Temp:  97.7 F (36.5 C)   TempSrc:  Oral   SpO2:  100% 96%  Weight: 43.1 kg (95 lb)    Height: 5\' 6"  (1.676 m)     General:  Alert and awake, NAD, nontoxic, pleasant/cooperative Head/Eye: No conjunctival hemorrhage, no icterus, [place]/AT, No nystagmus ENT:  No icterus,  No thrush, good dentition, no pharyngeal exudate Neck:  No masses, no lymphadenpathy, no bruits CV:  RRR, no rub, no gallop, no S3 Lung:  CTAB, good air movement, no wheeze, no rhonchi Abdomen: soft/lower abd pain, +BS, nondistended, no peritoneal signs Ext: No cyanosis, No rashes, No petechiae, No lymphangitis, No edema Buttock, vagina--no bleeding externally, no skin breakdown  Labs on Admission:  Basic Metabolic Panel: Recent Labs  Lab 12/23/16 0330  NA 140  K 3.4*  CL 107  CO2 26  GLUCOSE 115*  BUN 17  CREATININE 0.56  CALCIUM 9.2   Liver Function Tests: Recent Labs  Lab 12/23/16 0330  AST 23  ALT 12*  ALKPHOS 53  BILITOT 0.6  PROT 7.3  ALBUMIN 4.0   Recent Labs  Lab 12/23/16 0330  LIPASE 28   No results for input(s): AMMONIA in the last 168 hours. CBC: Recent Labs  Lab 12/23/16 0330  WBC 7.0  NEUTROABS 5.8  HGB 13.5  HCT 39.2  MCV 88.3  PLT 238   Coagulation Profile: No results for input(s): INR, PROTIME in the last 168 hours. Cardiac Enzymes: No results for input(s): CKTOTAL, CKMB, CKMBINDEX, TROPONINI in the last 168 hours. BNP: Invalid input(s): POCBNP CBG: No results for input(s): GLUCAP in the last 168 hours. Urine analysis:    Component Value  Date/Time   COLORURINE YELLOW 12/23/2016 0231   APPEARANCEUR CLEAR 12/23/2016 0231   LABSPEC 1.014 12/23/2016 0231   PHURINE 7.0 12/23/2016 0231   GLUCOSEU NEGATIVE 12/23/2016 0231   HGBUR NEGATIVE 12/23/2016 0231   BILIRUBINUR NEGATIVE 12/23/2016 0231   KETONESUR NEGATIVE 12/23/2016 0231   PROTEINUR 100 (A) 12/23/2016 0231   UROBILINOGEN 0.2 03/12/2013 1350   NITRITE NEGATIVE 12/23/2016 0231   LEUKOCYTESUR NEGATIVE 12/23/2016 0231   Sepsis Labs: @LABRCNTIP (procalcitonin:4,lacticidven:4) )No results found for this or any previous visit (from the past 240 hour(s)).   Radiological Exams on Admission: Ct Abdomen Pelvis W Contrast  Result Date: 12/23/2016 CLINICAL DATA:  Acute onset of sharp intermittent right groin and pelvic pain. Nausea and vomiting. EXAM: CT ABDOMEN AND PELVIS WITH CONTRAST TECHNIQUE: Multidetector CT imaging of the abdomen and pelvis was performed using the standard protocol following bolus administration of intravenous contrast. CONTRAST:  178mL ISOVUE-300 IOPAMIDOL (ISOVUE-300) INJECTION 61% COMPARISON:  CT of the abdomen and pelvis performed 09/05/2016 FINDINGS: Lower chest: The visualized lung bases are grossly clear. Mild cardiomegaly is noted. Diffuse coronary artery calcifications are seen. Trace pericardial fluid likely remains within normal limits. Hepatobiliary: The liver is unremarkable in appearance. The gallbladder is unremarkable in appearance. The common bile duct remains normal in caliber. Pancreas: The pancreas is within normal limits. Spleen: The spleen is unremarkable in appearance. Adrenals/Urinary Tract: A 2.0 cm left adrenal nodule is again noted. Small left renal cysts are noted. The kidneys are otherwise grossly unremarkable. There is no evidence of hydronephrosis. No renal or ureteral stones are identified. No significant perinephric stranding is seen. There is suggestion of vague adjacent retroperitoneal edema, which may reflect mild anasarca.  Stomach/Bowel: There is distention of small bowel loops to 2.8 cm in maximal diameter, with free fluid noted at the lower abdomen and pelvis. There appears to be a small focal obturator hernia on the right, involving mid to distal ileum, with distal decompression of small bowel loops. This reflects small bowel obstruction secondary to the obturator hernia. There is residual air within portions of the colon. The colon is grossly unremarkable in appearance, though difficult to fully assess due to surrounding bowel loops. Vascular/Lymphatic: Diffuse calcification is seen along the abdominal aorta and its branches. The abdominal aorta is otherwise grossly unremarkable. The inferior vena cava is grossly unremarkable. No retroperitoneal lymphadenopathy is seen. No pelvic sidewall lymphadenopathy is identified. Reproductive: The bladder is mildly distended and grossly unremarkable. Uterine fibroids are noted. No suspicious adnexal masses are seen. Other: No additional soft tissue abnormalities are seen. Musculoskeletal: No acute osseous abnormalities are identified. There is stable chronic compression deformity of vertebral body L5. The visualized musculature is unremarkable in appearance. IMPRESSION: 1. Small bowel obstruction noted secondary to a small focal right-sided obturator hernia involving mid to distal ileum, with distal decompression of small bowel loops. Associated distention of more proximal small bowel loops, with free fluid at the lower abdomen and pelvis. 2.  Aortic Atherosclerosis (ICD10-I70.0). 3. Small left renal cysts. 4. 2.0 cm left adrenal nodule again noted. 5. Mild cardiomegaly.  Diffuse coronary artery calcifications. 6. Uterine fibroids noted. These results were called by telephone at the time of interpretation on 12/23/2016 at 6:12 am to Dr. Ezequiel Essex, who verbally acknowledged these results. Electronically Signed   By: Garald Balding M.D.   On: 12/23/2016 06:13    EKG: Independently  reviewed. pending    Time spent:60 minutes Code Status:   FULL Family Communication:  Daughter updated at bedside Disposition Plan: expect 2-3 day hospitalization Consults called: general surgery DVT Prophylaxis: [place] Lovenox  Haidan Nhan, DO  [facility] Pager [phone]  If 7PM-7AM, please contact night-coverage [email or web address] Password [facility] 12/23/2016, 7:35 AM

## 2016-12-23 NOTE — Op Note (Signed)
[facility] Operative Note  12/23/16  Preoperative Diagnosis: Obturator hernia with obstruction   Postoperative Diagnosis: Obturator hernia with obstruction and ischemia of small bowel    Procedure(s) Performed: Exploratory laparotomy, reduction of hernia, resection of small bowel and primary side to side stapled anastomosis    Surgeon: Lanell Matar. Constance Haw, MD   Assistants: Aviva Signs, MD   Anesthesia: General endotracheal   Anesthesiologist: Lerry Liner, MD    Specimens: Small bowel    Estimated Blood Loss: 25 cc    Blood Replacement: None    Complications: None   Wound Class: Clean contaminated (small bowel resection)    Operative Indications: Ms. Riker is a 38 frail patient with dementia, Bipolar, Schizophrenia who presented with acute onset vaginal pain and nausea/vomiting. She was found to have an obturator hernia on CT scan.  Given these findings, the patient's daughter was informed of the risk and benefits of surgery and need for exploration with possible bowel resection and repair of hernia if possible.  She understood the risk and benefits including bleeding, infection, recurrence, need for prolonged hospital course and possible need for ICU and need for SNF pending patient's progression during the hospital stay.  She understood and opted to proceed.   Findings: Antimesenteric border of ileum with ischemic changes, no perforation, small caliber bowel, otherwise healthy bowel, hernia defect appreciated in the obturator canal but very small defect, unable to close     Procedure: The patient was taken to the operating room and placed supine. General endotracheal anesthesia was induced. Intravenous antibiotics were administered per protocol.  A nasogastric tube had already been placed preoperatively given the bowel obstruction. A foley catheter was placed for decompression of the bladder. The abdomen was prepared and draped in the usual sterile fashion.     A timeout was performed including the patient, correct procedure, precautions, and needed equipment.  A lower midline incision was made and deepened through the fascia with electrocautery. Metzenbaum scissors were used to enter the peritoneum.  Clear ascitic fluid was encountered and dilated small bowel.  The bowel was easily eviscerated out of the pelvis, revealing a short segment (< 2cm) of antimesenteric ischemia and serosal tear of the bowel.  The area was in a location of small caliber bowel and given the ischemia, it was felt that a resection would be the best option to prevent future stricture of this area. There was no signs of perforation or contamination.  Approximately 3 cm of small bowel was resection with linear cutting staple loads.  The mesentery was ligated with a Ligasure device.  Babcocks were used to align the bowel, and a side to side small bowel anastomosis on the antimesenteric boarder was performed with linear cutting stapler.  The staple line was hemostatic.  Minimal spillage of small bowel contents occurred.  The common channel was closed with a TIA bowel load, and the mucosa was cauterized on the staple line and the mesenteric boarder of the staple line was oversewn with a 3-0 silk to control bleeding. The mesenteric defect was closed with a figure of eight 3-0 silk suture.  A 3-0 silk suture was placed at the crotch of the staple line on the small bowel.   The lumen was larger 2 than fingerbreadths. The remaining bowel appeared normal and viable.    Attention was again turned to the pelvis, and identification of the hernia defect.  The defect was felt in the region of the obturator canal, but was extreme small, and  peritoneum was intact without any obvious invagination into the obturator foramen.  Her inguinal regions bilaterally were investigated and there was laxity of the canal but no obvious hernia.  The inguinal vessels were followed and no obvious femoral hernia was present.   Given these findings and the CT findings, it is believed the patient had a very small richter's type hernia at the obturator canal on the right.  The defect was extremely small and could not be closed safely and had intact peritoneum overlying the area, so closure was not attempted.     The NG tube was confirmed to be in good position.  The bowel was placed back into the abdomen in proper alignment.    Final inspection revealed acceptable hemostasis. All counts were correct at the end of the case. The patient was awakened from anesthesia and extubate without complication.  The patient went to the PACU in stable condition.    Curlene Labrum, MD [facility] [address] [place],  [address] [phone] (office)

## 2016-12-23 NOTE — ED Triage Notes (Signed)
Pt c/o right groin/vaginal pain and has vomited x 2 pta

## 2016-12-23 NOTE — Anesthesia Preprocedure Evaluation (Addendum)
Anesthesia Evaluation  Patient identified by MRN, date of birth, ID band Patient confused  General Assessment Comment:Ox0, responds slowly to commands   Reviewed: Allergy & Precautions, NPO status , Patient's Chart, lab work & pertinent test results  Airway Mallampati: III  TM Distance: <3 FB   Mouth opening: Limited Mouth Opening  Dental  (+) Edentulous Upper, Edentulous Lower   Pulmonary former smoker,    + rhonchi        Cardiovascular hypertension, Pt. on medications  Rhythm:Regular Rate:Normal     Neuro/Psych PSYCHIATRIC DISORDERS Bipolar Disorder Schizophrenia    GI/Hepatic Neg liver ROS, Hernia with SBO   Endo/Other    Renal/GU negative Renal ROS     Musculoskeletal   Abdominal   Peds  Hematology negative hematology ROS (+)   Anesthesia Other Findings   Reproductive/Obstetrics                            Anesthesia Physical Anesthesia Plan  ASA: IV and emergent  Anesthesia Plan: General   Post-op Pain Management:    Induction: Intravenous, Rapid sequence and Cricoid pressure planned  PONV Risk Score and Plan:   Airway Management Planned: Oral ETT  Additional Equipment:   Intra-op Plan:   Post-operative Plan: Extubation in OR  Informed Consent: I have reviewed the patients History and Physical, chart, labs and discussed the procedure including the risks, benefits and alternatives for the proposed anesthesia with the patient or authorized representative who has indicated his/her understanding and acceptance.     Plan Discussed with:   Anesthesia Plan Comments: (amidate induction.)        Anesthesia Quick Evaluation

## 2016-12-23 NOTE — Anesthesia Postprocedure Evaluation (Signed)
Anesthesia Post Note  Patient: Brittney Wall  Procedure(s) Performed: EXPLORATORY LAPAROTOMY (N/A ) SMALL BOWEL RESECTION (Abdomen)  Patient location during evaluation: PACU Anesthesia Type: General Level of consciousness: obtunded/minimal responses Pain management: pain level controlled Vital Signs Assessment: post-procedure vital signs reviewed and stable Respiratory status: spontaneous breathing and patient connected to nasal cannula oxygen Cardiovascular status: stable Postop Assessment: no apparent nausea or vomiting Anesthetic complications: no     Last Vitals:  Vitals:   12/23/16 1425 12/23/16 1430  BP:  (!) 159/77  Pulse: 98 95  Resp: 16 12  Temp:    SpO2: 98% 95%    Last Pain:  Vitals:   12/23/16 1105  TempSrc: Oral  PainSc:                  Astoria Condon

## 2016-12-23 NOTE — Progress Notes (Signed)
NG tube inserted into left nare without difficulty.  Auscultated sounds in the stomach and Chest X ray completed to confirm placement.  300 cc of brown stomach contents removed via continuous suction.

## 2016-12-23 NOTE — Anesthesia Procedure Notes (Signed)
Procedure Name: Intubation Date/Time: 12/23/2016 11:32 AM Performed by: Ollen Bowl, CRNA Pre-anesthesia Checklist: Patient identified, Patient being monitored, Timeout performed, Emergency Drugs available and Suction available Patient Re-evaluated:Patient Re-evaluated prior to induction Oxygen Delivery Method: Circle system utilized Preoxygenation: Pre-oxygenation with 100% oxygen Induction Type: IV induction, Rapid sequence and Cricoid Pressure applied Ventilation: Mask ventilation without difficulty Laryngoscope Size: 3 and Glidescope (Lopro S3) Grade View: Grade I Tube type: Oral Tube size: 7.0 mm Number of attempts: 1 Airway Equipment and Method: Stylet Placement Confirmation: ETT inserted through vocal cords under direct vision,  positive ETCO2 and breath sounds checked- equal and bilateral Secured at: 21 cm Tube secured with: Tape Dental Injury: Teeth and Oropharynx as per pre-operative assessment

## 2016-12-23 NOTE — Progress Notes (Signed)
[facility]   NO major issues. Resting. BP (!) 156/72   Pulse 96   Temp 98.8 F (37.1 C)   Resp 16   Ht 5\' 6"  (1.676 m)   Wt 95 lb (43.1 kg)   SpO2 95%   BMI 15.33 kg/m  NAD normal work breathing Binder in place Abd soft, appropriately tender   NPO, NG Labs in AM Monitor   Curlene Labrum, MD [facility] [place], [place] [address] [phone] (office)

## 2016-12-23 NOTE — ED Provider Notes (Signed)
[name] EMERGENCY DEPARTMENT Provider Note   CSN: [id number] Arrival date & time: 12/23/16  0144     History   Chief Complaint Chief Complaint  Patient presents with  . Emesis    HPI Brittney Wall is a 71 y.o. female.  Level 5 caveat for schizophrenia and dementia.  Patient presents with daughter.  Developed nausea and vomiting tonight x2.  Also has been complaining of right-sided groin and vaginal pain for the past day.  Denies any falls or trauma.  Daughter examined the patient and did not find anything on exam.  No pain with urination or hematuria.  No vaginal bleeding or discharge.  She is not sexually active.  No previous abdominal surgeries.  Normal bowel movement today.  Has been eating and drinking normally prior to vomiting tonight.   The history is provided by the patient and a relative.  Emesis   Pertinent negatives include no abdominal pain, no arthralgias, no cough, no diarrhea, no fever, no headaches and no myalgias.    Past Medical History:  Diagnosis Date  . Bipolar 1 disorder ([place])   . Dementia   . Hypertension   . Left adrenal mass ([place]) 09/06/2016  . Pneumothorax on right 09/07/2016  . Schizophrenia [facility])     Patient Active Problem List   Diagnosis Date Noted  . Pneumothorax on right 09/07/2016  . 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 Abnormal CT scan 09/06/2016  . Compression fracture of L5 lumbar vertebra ([place]) 09/06/2016  . Malignant hypertension 09/06/2016  . Ileus ([place]) 09/05/2016  . Chest pain 03/15/2015  . Hypertensive urgency 03/15/2015  . Schizophrenia ([place]) 03/15/2015  . Tobacco abuse 03/15/2015  . Dementia with behavioral disturbance 03/15/2015  . Protein-calorie malnutrition, severe ([place]) 03/15/2015  . Chest pain at rest 03/15/2015    Past Surgical History:  Procedure Laterality Date  . ortho procedure      OB History    No data available       Home Medications    Prior to Admission medications   Medication Sig  Start Date End Date Taking? Authorizing Provider  cloNIDine (CATAPRES) 0.1 MG tablet Take 0.1 mg by mouth 3 (three) times daily.      [provider]  donepezil (ARICEPT) 5 MG tablet Take 5 mg by mouth at bedtime.  02/16/15   [provider]  haloperidol (HALDOL) 5 MG tablet Take 5 mg by mouth at bedtime.  02/16/15   [provider]  hydrALAZINE (APRESOLINE) 10 MG tablet Take 2 tablets (20 mg total) by mouth at bedtime. 09/07/16   Rexene Alberts, MD  LORazepam (ATIVAN) 0.5 MG tablet Take 0.5 mg by mouth at bedtime as needed. 07/19/16   [provider]  megestrol (MEGACE) 40 MG/ML suspension Take 800 mg by mouth daily. For appetite    [provider]  minoxidil (LONITEN) 2.5 MG tablet Take 2 tablets (5 mg total) by mouth 2 (two) times daily. 03/17/15   Donne Hazel, MD  mirtazapine (REMERON) 30 MG tablet Take 30 mg by mouth at bedtime.      [provider]  pravastatin (PRAVACHOL) 20 MG tablet Take 20 mg by mouth at bedtime.     [provider]  senna-docusate (SENOKOT-S) 8.6-50 MG tablet Take 1 tablet by mouth at bedtime. 09/07/16   Rexene Alberts, MD  traZODone (DESYREL) 100 MG tablet Take 200 mg by mouth at bedtime as needed for sleep.    [provider]  Family History History reviewed. No pertinent family history.  Social History Social History   Tobacco Use  . Smoking status: Former Smoker    Packs/day: 0.50    Types: Cigarettes  . Smokeless tobacco: Never Used  Substance Use Topics  . Alcohol use: No  . Drug use: No     Allergies   Patient has no known allergies.   Review of Systems Review of Systems  Constitutional: Negative for activity change, appetite change and fever.  Respiratory: Negative for cough, chest tightness and shortness of breath.   Gastrointestinal: Positive for nausea and vomiting. Negative for abdominal pain and diarrhea.  Genitourinary: Positive for vaginal pain. Negative for dysuria,  hematuria, vaginal bleeding and vaginal discharge.  Musculoskeletal: Negative for arthralgias, back pain and myalgias.  Skin: Negative for rash.  Neurological: Negative for dizziness, weakness and headaches.    all other systems are negative except as noted in the HPI and PMH.    Physical Exam Updated Vital Signs BP (!) 244/95 (BP Location: Right Arm)   Pulse 65   Temp 97.7 F (36.5 C) (Oral)   Resp 16   Ht 5\' 6"  (1.676 m)   Wt 43.1 kg (95 lb)   SpO2 100%   BMI 15.33 kg/m   Physical Exam  Constitutional: She is oriented to person, place, and time. She appears well-developed and well-nourished. No distress.  HENT:  Head: Normocephalic and atraumatic.  Mouth/Throat: Oropharynx is clear and moist. No oropharyngeal exudate.  Eyes: Conjunctivae and EOM are normal. Pupils are equal, round, and reactive to light.  Neck: Normal range of motion. Neck supple.  No meningismus.  Cardiovascular: Normal rate, regular rhythm, normal heart sounds and intact distal pulses.  No murmur heard. Pulmonary/Chest: Effort normal and breath sounds normal. No respiratory distress.  Abdominal: Soft. There is no tenderness. There is no rebound and no guarding.  Genitourinary:  Genitourinary Comments: Chaperone present.  Normal external genitalia.  Patient points to right labia majora as source of pain.  This is normal on inspection.  There is no erythema, edema or fluctuance.  It Is nontender to palpation.  Musculoskeletal: Normal range of motion. She exhibits no edema or tenderness.  Neurological: She is alert and oriented to person, place, and time. No cranial nerve deficit. She exhibits normal muscle tone. Coordination normal.   5/5 strength throughout. CN 2-12 intact.Equal grip strength.   Skin: Skin is warm.  Psychiatric: She has a normal mood and affect. Her behavior is normal.  Nursing note and vitals reviewed.    ED Treatments / Results  Labs (all labs ordered are listed, but only abnormal  results are displayed) Labs Reviewed  URINALYSIS, ROUTINE W REFLEX MICROSCOPIC - Abnormal; Notable for the following components:      Result Value   Protein, ur 100 (*)    Bacteria, UA RARE (*)    Squamous Epithelial / LPF 0-5 (*)    All other components within normal limits  COMPREHENSIVE METABOLIC PANEL - Abnormal; Notable for the following components:   Potassium 3.4 (*)    Glucose, Bld 115 (*)    ALT 12 (*)    All other components within normal limits  URINE CULTURE  CBC WITH DIFFERENTIAL/PLATELET  LIPASE, BLOOD    EKG  EKG Interpretation None       Radiology Ct Abdomen Pelvis W Contrast  Result Date: 12/23/2016 CLINICAL DATA:  Acute onset of sharp intermittent right groin and pelvic pain. Nausea and vomiting. EXAM: CT ABDOMEN AND PELVIS  WITH CONTRAST TECHNIQUE: Multidetector CT imaging of the abdomen and pelvis was performed using the standard protocol following bolus administration of intravenous contrast. CONTRAST:  126mL ISOVUE-300 IOPAMIDOL (ISOVUE-300) INJECTION 61% COMPARISON:  CT of the abdomen and pelvis performed 09/05/2016 FINDINGS: Lower chest: The visualized lung bases are grossly clear. Mild cardiomegaly is noted. Diffuse coronary artery calcifications are seen. Trace pericardial fluid likely remains within normal limits. Hepatobiliary: The liver is unremarkable in appearance. The gallbladder is unremarkable in appearance. The common bile duct remains normal in caliber. Pancreas: The pancreas is within normal limits. Spleen: The spleen is unremarkable in appearance. Adrenals/Urinary Tract: A 2.0 cm left adrenal nodule is again noted. Small left renal cysts are noted. The kidneys are otherwise grossly unremarkable. There is no evidence of hydronephrosis. No renal or ureteral stones are identified. No significant perinephric stranding is seen. There is suggestion of vague adjacent retroperitoneal edema, which may reflect mild anasarca. Stomach/Bowel: There is distention  of small bowel loops to 2.8 cm in maximal diameter, with free fluid noted at the lower abdomen and pelvis. There appears to be a small focal obturator hernia on the right, involving mid to distal ileum, with distal decompression of small bowel loops. This reflects small bowel obstruction secondary to the obturator hernia. There is residual air within portions of the colon. The colon is grossly unremarkable in appearance, though difficult to fully assess due to surrounding bowel loops. Vascular/Lymphatic: Diffuse calcification is seen along the abdominal aorta and its branches. The abdominal aorta is otherwise grossly unremarkable. The inferior vena cava is grossly unremarkable. No retroperitoneal lymphadenopathy is seen. No pelvic sidewall lymphadenopathy is identified. Reproductive: The bladder is mildly distended and grossly unremarkable. Uterine fibroids are noted. No suspicious adnexal masses are seen. Other: No additional soft tissue abnormalities are seen. Musculoskeletal: No acute osseous abnormalities are identified. There is stable chronic compression deformity of vertebral body L5. The visualized musculature is unremarkable in appearance. IMPRESSION: 1. Small bowel obstruction noted secondary to a small focal right-sided obturator hernia involving mid to distal ileum, with distal decompression of small bowel loops. Associated distention of more proximal small bowel loops, with free fluid at the lower abdomen and pelvis. 2.  Aortic Atherosclerosis (ICD10-I70.0). 3. Small left renal cysts. 4. 2.0 cm left adrenal nodule again noted. 5. Mild cardiomegaly.  Diffuse coronary artery calcifications. 6. Uterine fibroids noted. These results were called by telephone at the time of interpretation on 12/23/2016 at 6:12 am to Dr. Ezequiel Essex, who verbally acknowledged these results. Electronically Signed   By: Garald Balding M.D.   On: 12/23/2016 06:13    Procedures Procedures (including critical care  time)  Medications Ordered in ED Medications - No data to display   Initial Impression / Assessment and Plan / ED Course  I have reviewed the triage vital signs and the nursing notes.  Pertinent labs & imaging results that were available during my care of the patient were reviewed by me and considered in my medical decision making (see chart for details).    Patient presents with vaginal pain and nausea and vomiting.  Her vagina appears normal on inspection.  It is not tender to palpation.  Urinalysis is negative.  CT scan is concerning for bowel obstruction caused by obturator hernia.  Patient's abdomen is soft and nontender.  She is given IV fluids and kept n.p.o.  Discussed with Dr. Constance Haw of general surgery who will evaluate patient and recommends medical admission.  Will attempt NG tube decompression which may  be difficult given patient's behavioral disturbances and agitation.  Admission to hospital service discussed with Dr. Dena Billet  Final Clinical Impressions(s) / ED Diagnoses   Final diagnoses:  Small bowel obstruction [facility])  Obturator hernia    ED Discharge Orders    None       Ezequiel Essex, MD 12/23/16 [phone]

## 2016-12-23 NOTE — Progress Notes (Signed)
Patient at this time is unable to use the incentive spirometer. She currently;y is ery confused and has an NG tube down her nose. I explained to the nurse and daughter present that we will monitor her and come back to work with her once the NG tube has been removed.

## 2016-12-23 NOTE — Transfer of Care (Signed)
Immediate Anesthesia Transfer of Care Note  Patient: Brittney Wall  Procedure(s) Performed: EXPLORATORY LAPAROTOMY (N/A ) SMALL BOWEL RESECTION (Abdomen)  Patient Location: PACU  Anesthesia Type:General  Level of Consciousness: sedated  Airway & Oxygen Therapy: Patient Spontanous Breathing and Patient connected to face mask oxygen  Post-op Assessment: Report given to RN  Post vital signs: Reviewed and stable  Last Vitals:  Vitals:   12/23/16 1105 12/23/16 1303  BP: (!) 210/106   Pulse:  (P) 94  Resp:    Temp: 36.6 C (P) 37.1 C  SpO2:  (P) 100%    Last Pain:  Vitals:   12/23/16 1105  TempSrc: Oral  PainSc:       Patients Stated Pain Goal: (Unable to assess due to mentation) (83/50/75 7322)  Complications: No apparent anesthesia complications

## 2016-12-23 NOTE — Consult Note (Signed)
[facility] Consult  Reason for Consult: Obturator hernia with obstruction  Referring Physician:  Dr. Wyvonnia Dusky  Chief Complaint    Emesis      Brittney Wall is a 71 y.o. female.  HPI: Brittney Wall is a pleasant but demented patient that came into the ED with complaints of vaginal pain and nausea/vomiting overnight. She has had a possible ileus/ SBO obstruction in the past 08/2016 that was managed conservatively, and no other reports of bowel obstruction or hernia.  She started having this pain last night, and it has progressed. In addition, she has vomited several times.  Her daughter is in the room, and reports no fevers, chills or SOB.  She lives with her daughter and the patient's sister on a rotating basis.     Past Medical History:  Diagnosis Date  . Bipolar 1 disorder ([place])   . Dementia   . Hypertension   . Left adrenal mass ([place]) 09/06/2016  . Pneumothorax on right 09/07/2016  . Schizophrenia [facility])     Past Surgical History:  Procedure Laterality Date  . ortho procedure      History reviewed. No pertinent family history.  Social History   Tobacco Use  . Smoking status: Former Smoker    Packs/day: 0.50    Types: Cigarettes  . Smokeless tobacco: Never Used  Substance Use Topics  . Alcohol use: No  . Drug use: No    Medications:  I have reviewed the patient's current medications. Prior to Admission:  Medications Prior to Admission  Medication Sig Dispense Refill Last Dose  . cloNIDine (CATAPRES) 0.1 MG tablet Take 0.1 mg by mouth 3 (three) times daily.     09/05/2016 at 1030  . donepezil (ARICEPT) 5 MG tablet Take 5 mg by mouth at bedtime.    06/13/2016 at Unknown time  . haloperidol (HALDOL) 5 MG tablet Take 5 mg by mouth at bedtime.    09/05/2016 at Unknown time  . hydrALAZINE (APRESOLINE) 10 MG tablet Take 2 tablets (20 mg total) by mouth at bedtime. 60 tablet 2   . LORazepam (ATIVAN) 0.5 MG tablet Take 0.5 mg by mouth at bedtime as needed.   Not  Taking at Unknown time  . megestrol (MEGACE) 40 MG/ML suspension Take 800 mg by mouth daily. For appetite   Not Taking at Unknown time  . minoxidil (LONITEN) 2.5 MG tablet Take 2 tablets (5 mg total) by mouth 2 (two) times daily. 60 tablet 0 09/05/2016 at 1030  . mirtazapine (REMERON) 30 MG tablet Take 30 mg by mouth at bedtime.     09/05/2016 at Unknown time  . pravastatin (PRAVACHOL) 20 MG tablet Take 20 mg by mouth at bedtime.    09/05/2016 at Unknown time  . senna-docusate (SENOKOT-S) 8.6-50 MG tablet Take 1 tablet by mouth at bedtime.     . traZODone (DESYREL) 100 MG tablet Take 200 mg by mouth at bedtime as needed for sleep.   09/05/2016 at Unknown time   Scheduled:  Continuous: . sodium chloride     Allergies: No Known Allergies   ROS:  A comprehensive review of systems was negative except for: Gastrointestinal: positive for abdominal pain, nausea, vomiting and vaginal pain  Blood pressure (!) 178/87, pulse 72, temperature 97.7 F (36.5 C), temperature source Oral, resp. rate 18, height _0  (1.676 m), weight 95 lb (43.1 kg), SpO2 96 %. Physical Exam  Constitutional: She appears malnourished. She appears cachectic. No distress.  HENT:  Head: Normocephalic.  Cardiovascular: Normal rate and regular rhythm.  Pulmonary/Chest: Effort normal and breath sounds normal.  Abdominal: Soft. She exhibits no distension. There is tenderness. There is no rebound and no guarding.  Musculoskeletal: She exhibits no edema.  Neurological: She is alert.  Skin: Skin is warm and dry.  Psychiatric: Affect normal. She exhibits normal recent memory and normal remote memory.  Vitals reviewed.   Results: Results for orders placed or performed during the hospital encounter of 12/23/16 (from the past 48 hour(s))  Urinalysis, Routine w reflex microscopic     Status: Abnormal   Collection Time: 12/23/16  2:31 AM  Result Value Ref Range   Color, Urine YELLOW YELLOW   APPearance CLEAR CLEAR   Specific  Gravity, Urine 1.014 1.005 - 1.030   pH 7.0 5.0 - 8.0   Glucose, UA NEGATIVE NEGATIVE mg/dL   Hgb urine dipstick NEGATIVE NEGATIVE   Bilirubin Urine NEGATIVE NEGATIVE   Ketones, ur NEGATIVE NEGATIVE mg/dL   Protein, ur 100 (A) NEGATIVE mg/dL   Nitrite NEGATIVE NEGATIVE   Leukocytes, UA NEGATIVE NEGATIVE   RBC / HPF 0-5 0 - 5 RBC/hpf   WBC, UA 6-30 0 - 5 WBC/hpf   Bacteria, UA RARE (A) NONE SEEN   Squamous Epithelial / LPF 0-5 (A) NONE SEEN   Mucus PRESENT   CBC with Differential/Platelet     Status: None   Collection Time: 12/23/16  3:30 AM  Result Value Ref Range   WBC 7.0 4.0 - 10.5 K/uL   RBC 4.44 3.87 - 5.11 MIL/uL   Hemoglobin 13.5 12.0 - 15.0 g/dL   HCT 39.2 36.0 - 46.0 %   MCV 88.3 78.0 - 100.0 fL   MCH 30.4 26.0 - 34.0 pg   MCHC 34.4 30.0 - 36.0 g/dL   RDW 13.5 11.5 - 15.5 %   Platelets 238 150 - 400 K/uL   Neutrophils Relative % 83 %   Neutro Abs 5.8 1.7 - 7.7 K/uL   Lymphocytes Relative 10 %   Lymphs Abs 0.7 0.7 - 4.0 K/uL   Monocytes Relative 7 %   Monocytes Absolute 0.5 0.1 - 1.0 K/uL   Eosinophils Relative 0 %   Eosinophils Absolute 0.0 0.0 - 0.7 K/uL   Basophils Relative 0 %   Basophils Absolute 0.0 0.0 - 0.1 K/uL  Comprehensive metabolic panel     Status: Abnormal   Collection Time: 12/23/16  3:30 AM  Result Value Ref Range   Sodium 140 135 - 145 mmol/L   Potassium 3.4 (L) 3.5 - 5.1 mmol/L   Chloride 107 101 - 111 mmol/L   CO2 26 22 - 32 mmol/L   Glucose, Bld 115 (H) 65 - 99 mg/dL   BUN 17 6 - 20 mg/dL   Creatinine, Ser 0.56 0.44 - 1.00 mg/dL   Calcium 9.2 8.9 - 10.3 mg/dL   Total Protein 7.3 6.5 - 8.1 g/dL   Albumin 4.0 3.5 - 5.0 g/dL   AST 23 15 - 41 U/L   ALT 12 (L) 14 - 54 U/L   Alkaline Phosphatase 53 38 - 126 U/L   Total Bilirubin 0.6 0.3 - 1.2 mg/dL   GFR calc non Af Amer >60 >60 mL/min   GFR calc Af Amer >60 >60 mL/min    Comment: (NOTE) The eGFR has been calculated using the CKD EPI equation. This calculation has not been validated in  all clinical situations. eGFR's persistently <60 mL/min signify possible Chronic Kidney Disease.    Anion gap 7  5 - 15  Lipase, blood     Status: None   Collection Time: 12/23/16  3:30 AM  Result Value Ref Range   Lipase 28 11 - 51 U/L   Personally reviewed CT from 12/2016 and from 08/2016 - obturator hernia identified, dilated loops of bowel and decompressed bowel, no signs of ischemia, 08/2016 do not see the hernia at that time   Ct Abdomen Pelvis W Contrast  Result Date: 12/23/2016 CLINICAL DATA:  Acute onset of sharp intermittent right groin and pelvic pain. Nausea and vomiting. EXAM: CT ABDOMEN AND PELVIS WITH CONTRAST TECHNIQUE: Multidetector CT imaging of the abdomen and pelvis was performed using the standard protocol following bolus administration of intravenous contrast. CONTRAST:  136m ISOVUE-300 IOPAMIDOL (ISOVUE-300) INJECTION 61% COMPARISON:  CT of the abdomen and pelvis performed 09/05/2016 FINDINGS: Lower chest: The visualized lung bases are grossly clear. Mild cardiomegaly is noted. Diffuse coronary artery calcifications are seen. Trace pericardial fluid likely remains within normal limits. Hepatobiliary: The liver is unremarkable in appearance. The gallbladder is unremarkable in appearance. The common bile duct remains normal in caliber. Pancreas: The pancreas is within normal limits. Spleen: The spleen is unremarkable in appearance. Adrenals/Urinary Tract: A 2.0 cm left adrenal nodule is again noted. Small left renal cysts are noted. The kidneys are otherwise grossly unremarkable. There is no evidence of hydronephrosis. No renal or ureteral stones are identified. No significant perinephric stranding is seen. There is suggestion of vague adjacent retroperitoneal edema, which may reflect mild anasarca. Stomach/Bowel: There is distention of small bowel loops to 2.8 cm in maximal diameter, with free fluid noted at the lower abdomen and pelvis. There appears to be a small focal obturator  hernia on the right, involving mid to distal ileum, with distal decompression of small bowel loops. This reflects small bowel obstruction secondary to the obturator hernia. There is residual air within portions of the colon. The colon is grossly unremarkable in appearance, though difficult to fully assess due to surrounding bowel loops. Vascular/Lymphatic: Diffuse calcification is seen along the abdominal aorta and its branches. The abdominal aorta is otherwise grossly unremarkable. The inferior vena cava is grossly unremarkable. No retroperitoneal lymphadenopathy is seen. No pelvic sidewall lymphadenopathy is identified. Reproductive: The bladder is mildly distended and grossly unremarkable. Uterine fibroids are noted. No suspicious adnexal masses are seen. Other: No additional soft tissue abnormalities are seen. Musculoskeletal: No acute osseous abnormalities are identified. There is stable chronic compression deformity of vertebral body L5. The visualized musculature is unremarkable in appearance. IMPRESSION: 1. Small bowel obstruction noted secondary to a small focal right-sided obturator hernia involving mid to distal ileum, with distal decompression of small bowel loops. Associated distention of more proximal small bowel loops, with free fluid at the lower abdomen and pelvis. 2.  Aortic Atherosclerosis (ICD10-I70.0). 3. Small left renal cysts. 4. 2.0 cm left adrenal nodule again noted. 5. Mild cardiomegaly.  Diffuse coronary artery calcifications. 6. Uterine fibroids noted. These results were called by telephone at the time of interpretation on 12/23/2016 at 6:12 am to Dr. SEzequiel Essex who verbally acknowledged these results. Electronically Signed   By: JGarald BaldingM.D.   On: 12/23/2016 06:13     Assessment & Plan:  BAAREN KROGis a 71y.o. female with a bowel obstruction related to an obturator hernia that was found on CT this AM. She is having nausea/ vomiting and significant pain, and per  her daughter is not quite herself.  She otherwise has been hemodynamically stable with  high blood pressure, and her labs are within normal limits at this time.   -OR for exploration and reduction of hernia, possible bowel resection and possible hernia repair -EKG ordered and CXR ordered, ED has ordered NG tube    All questions were answered to the satisfaction of the daughter.  The risk and benefits of exploration with possible bowel resection and hernia repair were discussed including but not limited to bleeding, infection, need for bowel resection, and risk of a long post operative course with potential need for ICU stay and SNF if patient does not do well post operatively.   After careful consideration, MIKITA LESMEISTER daughter has agreed to proceed.    Virl Cagey 12/23/2016, 9:43 AM

## 2016-12-24 LAB — BASIC METABOLIC PANEL
Anion gap: 10 (ref 5–15)
BUN: 16 mg/dL (ref 6–20)
CALCIUM: 8.4 mg/dL — AB (ref 8.9–10.3)
CO2: 28 mmol/L (ref 22–32)
CREATININE: 0.71 mg/dL (ref 0.44–1.00)
Chloride: 102 mmol/L (ref 101–111)
GFR calc Af Amer: >60 mL/min (ref >60)
GLUCOSE: 95 mg/dL (ref 65–99)
Potassium: 4 mmol/L (ref 3.5–5.1)
Sodium: 140 mmol/L (ref 135–145)

## 2016-12-24 LAB — CBC
HCT: 44.4 % (ref 36.0–46.0)
HEMOGLOBIN: 15 g/dL (ref 12.0–15.0)
MCH: 30.7 pg (ref 26.0–34.0)
MCHC: 33.8 g/dL (ref 30.0–36.0)
MCV: 91 fL (ref 78.0–100.0)
Platelets: 211 10*3/uL (ref 150–400)
RBC: 4.88 MIL/uL (ref 3.87–5.11)
RDW: 13.9 % (ref 11.5–15.5)
WBC: 11 10*3/uL — ABNORMAL HIGH (ref 4.0–10.5)

## 2016-12-24 LAB — MAGNESIUM: Magnesium: 1.7 mg/dL (ref 1.7–2.4)

## 2016-12-24 LAB — HEMOGLOBIN A1C
HEMOGLOBIN A1C: 5.6 % (ref 4.8–5.6)
MEAN PLASMA GLUCOSE: 114.02 mg/dL

## 2016-12-24 LAB — PHOSPHORUS: Phosphorus: 3.3 mg/dL (ref 2.5–4.6)

## 2016-12-24 MED ORDER — TRAZODONE HCL 50 MG PO TABS
200.0000 mg | ORAL_TABLET | Freq: Every evening | ORAL | Status: DC | PRN
  Administered 2016-12-24 – 2016-12-27 (×4): 200 mg via ORAL
  Filled 2016-12-24 (×4): qty 4

## 2016-12-24 MED ORDER — HALOPERIDOL LACTATE 5 MG/ML IJ SOLN
5.0000 mg | Freq: Once | INTRAMUSCULAR | Status: AC
  Administered 2016-12-24: 5 mg via INTRAMUSCULAR
  Filled 2016-12-24: qty 1

## 2016-12-24 MED ORDER — HYDRALAZINE HCL 10 MG PO TABS
20.0000 mg | ORAL_TABLET | Freq: Every day | ORAL | Status: DC
  Administered 2016-12-24 – 2016-12-26 (×3): 20 mg via ORAL
  Filled 2016-12-24 (×4): qty 2

## 2016-12-24 MED ORDER — MAGNESIUM SULFATE 2 GM/50ML IV SOLN
2.0000 g | Freq: Once | INTRAVENOUS | Status: AC
  Administered 2016-12-24: 2 g via INTRAVENOUS
  Filled 2016-12-24: qty 50

## 2016-12-24 MED ORDER — HYDRALAZINE HCL 20 MG/ML IJ SOLN
10.0000 mg | Freq: Four times a day (QID) | INTRAMUSCULAR | Status: DC | PRN
  Administered 2016-12-27: 10 mg via INTRAVENOUS
  Filled 2016-12-24 (×2): qty 1

## 2016-12-24 MED ORDER — MINOXIDIL 2.5 MG PO TABS
5.0000 mg | ORAL_TABLET | Freq: Two times a day (BID) | ORAL | Status: DC
  Administered 2016-12-24 – 2016-12-28 (×9): 5 mg via ORAL
  Filled 2016-12-24 (×15): qty 2

## 2016-12-24 MED ORDER — LABETALOL HCL 5 MG/ML IV SOLN: 5.0000 mg | INTRAVENOUS | Status: DC | PRN

## 2016-12-24 MED ORDER — MIRTAZAPINE 15 MG PO TABS
30.0000 mg | ORAL_TABLET | Freq: Every day | ORAL | Status: DC
  Administered 2016-12-24 – 2016-12-27 (×4): 30 mg via ORAL
  Filled 2016-12-24 (×4): qty 2

## 2016-12-24 MED ORDER — OXYCODONE HCL 5 MG PO TABS
5.0000 mg | ORAL_TABLET | Freq: Four times a day (QID) | ORAL | Status: DC | PRN
  Administered 2016-12-25 – 2016-12-27 (×3): 5 mg via ORAL
  Filled 2016-12-24 (×3): qty 1

## 2016-12-24 NOTE — Anesthesia Postprocedure Evaluation (Signed)
Anesthesia Post Note  Patient: Brittney Wall  Procedure(s) Performed: EXPLORATORY LAPAROTOMY (N/A ) SMALL BOWEL RESECTION (Abdomen)  Patient location during evaluation: Nursing Unit Anesthesia Type: General Level of consciousness: patient cooperative Pain management: pain level controlled Vital Signs Assessment: post-procedure vital signs reviewed and stable Respiratory status: spontaneous breathing Cardiovascular status: stable Postop Assessment: no apparent nausea or vomiting Anesthetic complications: no     Last Vitals:  Vitals:   12/24/16 0730 12/24/16 1249  BP: (!) 162/71 (!) 173/73  Pulse: (!) 103   Resp: 16   Temp: 36.8 C   SpO2:      Last Pain:  Vitals:   12/24/16 0730  TempSrc: Oral  PainSc:                  Drucie Opitz

## 2016-12-24 NOTE — Addendum Note (Signed)
Addendum  created 12/24/16 1405 by Vista Deck, CRNA   Sign clinical note

## 2016-12-24 NOTE — Progress Notes (Signed)
Dr Curlene Labrum of general surgery also notified that patient had pulled out NG-TUBE, MD'S response was " I will be up to see the patient soon". Vital signs stable.

## 2016-12-24 NOTE — Progress Notes (Signed)
Pts BP 189/69 upon morning rounds, Pt given Hydralazine 10mg  per PRN order. Will continue to monitor pt

## 2016-12-24 NOTE — Progress Notes (Signed)
Patient pulled out NG -tube this am, Dr Maylene Roes notified. Family at bedside. Will continue to monitor patient.
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
## 2016-12-24 NOTE — Progress Notes (Signed)
PROGRESS NOTE    Brittney Wall  ZOX:[id number] DOB: Oct 17, 1945 DOA: 12/23/2016 PCP: [name], MD     Brief Narrative:  Brittney Wall is a 71 y.o. female with medical history of hypertension, dementia, bipolar disorder, schizophrenia, and hyperlipidemia presenting with 1 day history of lower abdominal pain with associated nausea and vomiting x2.  Due to the patient's dementia, she is unable to provide any history.  All of history is obtained from review of the chart and the patient's daughter at the bedside.  There is no reports of fevers, chills, headache, chest pain, shortness breath, diarrhea, hematochezia, melena, hematemesis.  The patient has not been started on any new medications.  The patient was also complaining of some vaginal pain.  There is no vaginal discharge or bleeding her daughter examined her perineum, she stated it was unremarkable.  Because of her abdominal pain and vomiting, the patient was brought to the emergency department for further evaluation. The patient was afebrile hemodynamically stable saturating 100% on room air.  BMP, LFTs, CBC were unremarkable in the emergency department.  CT of the abdomen and pelvis revealed a small focal obturator hernia on the right involving the mid to distal ileum with distal decompression suggestive of SBO secondary to obturator hernia.  General surgery was consulted.  Patient subsequently underwent exploratory laparotomy with reduction of hernia and small bowel resection on 11/12.  Assessment & Plan:   Active Problems:   Hypertensive urgency   Schizophrenia (HCC)   Dementia with behavioral disturbance   Protein-calorie malnutrition, severe (HCC)   Small bowel obstruction (HCC)   Hypokalemia   Ischemic bowel disease (HCC)  Small bowel obstruction with obturator hernia -S/p ex lap, reduction of hernia, resection of small bowel and anastomosis with Dr. Constance Haw on 11/12  -NGT removed by patient this morning, leave out for  now -NPO with ice chips, sips with meds only -General surgery following, discussed with Dr. Constance Haw this morning   Hypertensive urgency -Resume home meds including hydralazine, minoxidil -IV hydralazine, labetalol prn  -Catapres patch placed on admission due to NPO status. Continue   Dementia with behavioral disturbance -Haldol as needed agitation  Schizophrenia -Continue Haldol at bedtime--convert to IV  Severe protein calorie malnutrition -Start supplements when able to take po    DVT prophylaxis: lovenox Code Status: full Family Communication: family at bedside Disposition Plan: pending improvement    Consultants:   General Surgery   Procedures:   S/p ex lap, reduction of hernia, resection of small bowel and anastomosis with Dr. Constance Haw on 11/12   Antimicrobials:  Anti-infectives (From admission, onward)   Start     Dose/Rate Route Frequency Ordered Stop   12/23/16 1030  cefoTEtan in Dextrose 5% (CEFOTAN) IVPB 2 g     2 g Intravenous On call to O.R. 12/23/16 0949 12/23/16 1134       Subjective: Patient with dementia, unable to verbalize. Per RN, patient removed NGT this morning.   Objective: Vitals:   12/23/16 2330 12/24/16 0330 12/24/16 0730 12/24/16 1249  BP: (!) 165/74 (!) 189/69 (!) 162/71 (!) 173/73  Pulse: (!) 102 92 (!) 103   Resp: 16 16 16    Temp: 98.2 F (36.8 C) 99.4 F (37.4 C) 98.2 F (36.8 C)   TempSrc: Oral Oral Oral   SpO2: 98% 98%    Weight:      Height:        Intake/Output Summary (Last 24 hours) at 12/24/2016 1353 Last data filed at 12/24/2016 0900  Gross per 24 hour  Intake 1075 ml  Output 1210 ml  Net -135 ml   Filed Weights   12/23/16 0201  Weight: 43.1 kg (95 lb)    Examination:  General exam: Appears calm  Respiratory system: Clear to auscultation. Respiratory effort normal. Cardiovascular system: S1 & S2 heard, tachycardic rate, regular rhythm. No JVD, murmurs, rubs, gallops or clicks. No pedal  edema. Gastrointestinal system: Abdomen is nondistended, abdominal binder in place Central nervous system: Alert, confused, remains in mittens Extremities: Symmetric Skin: No rashes, lesions or ulcers Psychiatry: +Dementia, confused   Data Reviewed: I have personally reviewed following labs and imaging studies  CBC: Recent Labs  Lab 12/23/16 0330 12/24/16 0457  WBC 7.0 11.0*  NEUTROABS 5.8  --   HGB 13.5 15.0  HCT 39.2 44.4  MCV 88.3 91.0  PLT 238 229   Basic Metabolic Panel: Recent Labs  Lab 12/23/16 0330 12/24/16 0457  NA 140 140  K 3.4* 4.0  CL 107 102  CO2 26 28  GLUCOSE 115* 95  BUN 17 16  CREATININE 0.56 0.71  CALCIUM 9.2 8.4*  MG  --  1.7  PHOS  --  3.3   GFR: Estimated Creatinine Clearance: 44.5 mL/min (by C-G formula based on SCr of 0.71 mg/dL). Liver Function Tests: Recent Labs  Lab 12/23/16 0330  AST 23  ALT 12*  ALKPHOS 53  BILITOT 0.6  PROT 7.3  ALBUMIN 4.0   Recent Labs  Lab 12/23/16 0330  LIPASE 28   No results for input(s): AMMONIA in the last 168 hours. Coagulation Profile: No results for input(s): INR, PROTIME in the last 168 hours. Cardiac Enzymes: No results for input(s): CKTOTAL, CKMB, CKMBINDEX, TROPONINI in the last 168 hours. BNP (last 3 results) No results for input(s): PROBNP in the last 8760 hours. HbA1C: Recent Labs    12/24/16 0457  HGBA1C 5.6   CBG: No results for input(s): GLUCAP in the last 168 hours. Lipid Profile: No results for input(s): CHOL, HDL, LDLCALC, TRIG, CHOLHDL, LDLDIRECT in the last 72 hours. Thyroid Function Tests: No results for input(s): TSH, T4TOTAL, FREET4, T3FREE, THYROIDAB in the last 72 hours. Anemia Panel: No results for input(s): VITAMINB12, FOLATE, FERRITIN, TIBC, IRON, RETICCTPCT in the last 72 hours. Sepsis Labs: No results for input(s): PROCALCITON, LATICACIDVEN in the last 168 hours.  No results found for this or any previous visit (from the past 240 hour(s)).     Radiology  Studies: Ct Abdomen Pelvis W Contrast  Result Date: 12/23/2016 CLINICAL DATA:  Acute onset of sharp intermittent right groin and pelvic pain. Nausea and vomiting. EXAM: CT ABDOMEN AND PELVIS WITH CONTRAST TECHNIQUE: Multidetector CT imaging of the abdomen and pelvis was performed using the standard protocol following bolus administration of intravenous contrast. CONTRAST:  146mL ISOVUE-300 IOPAMIDOL (ISOVUE-300) INJECTION 61% COMPARISON:  CT of the abdomen and pelvis performed 09/05/2016 FINDINGS: Lower chest: The visualized lung bases are grossly clear. Mild cardiomegaly is noted. Diffuse coronary artery calcifications are seen. Trace pericardial fluid likely remains within normal limits. Hepatobiliary: The liver is unremarkable in appearance. The gallbladder is unremarkable in appearance. The common bile duct remains normal in caliber. Pancreas: The pancreas is within normal limits. Spleen: The spleen is unremarkable in appearance. Adrenals/Urinary Tract: A 2.0 cm left adrenal nodule is again noted. Small left renal cysts are noted. The kidneys are otherwise grossly unremarkable. There is no evidence of hydronephrosis. No renal or ureteral stones are identified. No significant perinephric stranding is seen. There is  suggestion of vague adjacent retroperitoneal edema, which may reflect mild anasarca. Stomach/Bowel: There is distention of small bowel loops to 2.8 cm in maximal diameter, with free fluid noted at the lower abdomen and pelvis. There appears to be a small focal obturator hernia on the right, involving mid to distal ileum, with distal decompression of small bowel loops. This reflects small bowel obstruction secondary to the obturator hernia. There is residual air within portions of the colon. The colon is grossly unremarkable in appearance, though difficult to fully assess due to surrounding bowel loops. Vascular/Lymphatic: Diffuse calcification is seen along the abdominal aorta and its branches. The  abdominal aorta is otherwise grossly unremarkable. The inferior vena cava is grossly unremarkable. No retroperitoneal lymphadenopathy is seen. No pelvic sidewall lymphadenopathy is identified. Reproductive: The bladder is mildly distended and grossly unremarkable. Uterine fibroids are noted. No suspicious adnexal masses are seen. Other: No additional soft tissue abnormalities are seen. Musculoskeletal: No acute osseous abnormalities are identified. There is stable chronic compression deformity of vertebral body L5. The visualized musculature is unremarkable in appearance. IMPRESSION: 1. Small bowel obstruction noted secondary to a small focal right-sided obturator hernia involving mid to distal ileum, with distal decompression of small bowel loops. Associated distention of more proximal small bowel loops, with free fluid at the lower abdomen and pelvis. 2.  Aortic Atherosclerosis (ICD10-I70.0). 3. Small left renal cysts. 4. 2.0 cm left adrenal nodule again noted. 5. Mild cardiomegaly.  Diffuse coronary artery calcifications. 6. Uterine fibroids noted. These results were called by telephone at the time of interpretation on 12/23/2016 at 6:12 am to Dr. Ezequiel Essex, who verbally acknowledged these results. Electronically Signed   By: Garald Balding M.D.   On: 12/23/2016 06:13   Dg Chest Port 1 View  Result Date: 12/23/2016 CLINICAL DATA:  Preop for small bowel obstruction. EXAM: PORTABLE CHEST 1 VIEW COMPARISON:  09/07/2016. FINDINGS: The heart size and mediastinal contours are within normal limits. Both lungs are clear. The visualized skeletal structures are unremarkable. Nasogastric tube is in the stomach. Compared with priors, aeration is improved. IMPRESSION: No active disease. Electronically Signed   By: Staci Righter M.D.   On: 12/23/2016 11:03      Scheduled Meds: . cloNIDine  0.1 mg Transdermal Weekly  . enoxaparin (LOVENOX) injection  30 mg Subcutaneous Q24H  . haloperidol lactate  5 mg  Intravenous QHS  . hydrALAZINE  20 mg Oral QHS  . minoxidil  5 mg Oral BID  . mirtazapine  30 mg Oral QHS   Continuous Infusions: . sodium chloride 125 mL/hr at 12/24/16 0557     LOS: 1 day    Time spent: 40 minutes   Dessa Phi, DO [facility] [email or web address] Password [email or web address] 12/24/2016, 1:53 PM
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
## 2016-12-24 NOTE — Progress Notes (Signed)
[facility] Progress Note  1 Day Post-Op  Subjective: No major issues. NG out this AM. Otherwise intermittently complaining of pain. Daughter worried she may need sitter if she leaves.   Objective: Vital signs in last 24 hours: Temp:  [98.2 F (36.8 C)-99.4 F (37.4 C)] 98.2 F (36.8 C) (11/13 0730) Pulse Rate:  [92-103] 103 (11/13 0730) Resp:  [14-16] 16 (11/13 0730) BP: (118-189)/(68-79) 173/73 (11/13 1249) SpO2:  [94 %-98 %] 98 % (11/13 0330) Last BM Date: 12/22/16  Intake/Output from previous day: 11/12 0701 - 11/13 0700 In: 2975 [I.V.:2975] Out: 2035 [Urine:1500; Emesis/NG output:10; Blood:25] Intake/Output this shift: No intake/output data recorded.  General appearance: alert, cooperative, cachectic and no distress Resp: normal work breathing GI: soft, nondistended, dressing in place, appropriate pain Extremities: extremities normal, atraumatic, no cyanosis or edema  Lab Results:  Recent Labs    12/23/16 0330 12/24/16 0457  WBC 7.0 11.0*  HGB 13.5 15.0  HCT 39.2 44.4  PLT 238 211   BMET Recent Labs    12/23/16 0330 12/24/16 0457  NA 140 140  K 3.4* 4.0  CL 107 102  CO2 26 28  GLUCOSE 115* 95  BUN 17 16  CREATININE 0.56 0.71  CALCIUM 9.2 8.4*   PT/INR No results for input(s): LABPROT, INR in the last 72 hours.  Studies/Results: Ct Abdomen Pelvis W Contrast  Result Date: 12/23/2016 CLINICAL DATA:  Acute onset of sharp intermittent right groin and pelvic pain. Nausea and vomiting. EXAM: CT ABDOMEN AND PELVIS WITH CONTRAST TECHNIQUE: Multidetector CT imaging of the abdomen and pelvis was performed using the standard protocol following bolus administration of intravenous contrast. CONTRAST:  163mL ISOVUE-300 IOPAMIDOL (ISOVUE-300) INJECTION 61% COMPARISON:  CT of the abdomen and pelvis performed 09/05/2016 FINDINGS: Lower chest: The visualized lung bases are grossly clear. Mild cardiomegaly is noted. Diffuse coronary artery  calcifications are seen. Trace pericardial fluid likely remains within normal limits. Hepatobiliary: The liver is unremarkable in appearance. The gallbladder is unremarkable in appearance. The common bile duct remains normal in caliber. Pancreas: The pancreas is within normal limits. Spleen: The spleen is unremarkable in appearance. Adrenals/Urinary Tract: A 2.0 cm left adrenal nodule is again noted. Small left renal cysts are noted. The kidneys are otherwise grossly unremarkable. There is no evidence of hydronephrosis. No renal or ureteral stones are identified. No significant perinephric stranding is seen. There is suggestion of vague adjacent retroperitoneal edema, which may reflect mild anasarca. Stomach/Bowel: There is distention of small bowel loops to 2.8 cm in maximal diameter, with free fluid noted at the lower abdomen and pelvis. There appears to be a small focal obturator hernia on the right, involving mid to distal ileum, with distal decompression of small bowel loops. This reflects small bowel obstruction secondary to the obturator hernia. There is residual air within portions of the colon. The colon is grossly unremarkable in appearance, though difficult to fully assess due to surrounding bowel loops. Vascular/Lymphatic: Diffuse calcification is seen along the abdominal aorta and its branches. The abdominal aorta is otherwise grossly unremarkable. The inferior vena cava is grossly unremarkable. No retroperitoneal lymphadenopathy is seen. No pelvic sidewall lymphadenopathy is identified. Reproductive: The bladder is mildly distended and grossly unremarkable. Uterine fibroids are noted. No suspicious adnexal masses are seen. Other: No additional soft tissue abnormalities are seen. Musculoskeletal: No acute osseous abnormalities are identified. There is stable chronic compression deformity of vertebral body L5. The visualized musculature is unremarkable in appearance. IMPRESSION: 1. Small bowel  obstruction noted  secondary to a small focal right-sided obturator hernia involving mid to distal ileum, with distal decompression of small bowel loops. Associated distention of more proximal small bowel loops, with free fluid at the lower abdomen and pelvis. 2.  Aortic Atherosclerosis (ICD10-I70.0). 3. Small left renal cysts. 4. 2.0 cm left adrenal nodule again noted. 5. Mild cardiomegaly.  Diffuse coronary artery calcifications. 6. Uterine fibroids noted. These results were called by telephone at the time of interpretation on 12/23/2016 at 6:12 am to Dr. Ezequiel Essex, who verbally acknowledged these results. Electronically Signed   By: Garald Balding M.D.   On: 12/23/2016 06:13   Dg Chest Port 1 View  Result Date: 12/23/2016 CLINICAL DATA:  Preop for small bowel obstruction. EXAM: PORTABLE CHEST 1 VIEW COMPARISON:  09/07/2016. FINDINGS: The heart size and mediastinal contours are within normal limits. Both lungs are clear. The visualized skeletal structures are unremarkable. Nasogastric tube is in the stomach. Compared with priors, aeration is improved. IMPRESSION: No active disease. Electronically Signed   By: Staci Righter M.D.   On: 12/23/2016 11:03    Anti-infectives: Anti-infectives (From admission, onward)   Start     Dose/Rate Route Frequency Ordered Stop   12/23/16 1030  cefoTEtan in Dextrose 5% (CEFOTAN) IVPB 2 g     2 g Intravenous On call to O.R. 12/23/16 0949 12/23/16 1134      Assessment/Plan: Ms. Sneed is a 71 yo with dementia, bipolar, schizophrenia who presented with an obturator hernia causing a bowel obstruction.  She is POD 1 s/p Ex lap, SBR for ischemic bowel.  She is doing fair. -PRN for pain -Can have oral meds and ice chips, 30cc every 2 hours  -Otherwise NPO but can leave out NG tube, if nausea or vomiting will need replaced by RN  -Can remove foley when medicine feels appropriate  -Labs in AM  Discussed with Dr. Maylene Roes.   Will follow.    LOS: 1 day     Virl Cagey 12/24/2016

## 2016-12-25 ENCOUNTER — Encounter (HOSPITAL_COMMUNITY): Payer: Self-pay | Admitting: General Surgery

## 2016-12-25 LAB — CBC
HEMATOCRIT: 37.1 % (ref 36.0–46.0)
HEMOGLOBIN: 12.2 g/dL (ref 12.0–15.0)
MCH: 30.1 pg (ref 26.0–34.0)
MCHC: 32.9 g/dL (ref 30.0–36.0)
MCV: 91.6 fL (ref 78.0–100.0)
Platelets: 191 10*3/uL (ref 150–400)
RBC: 4.05 MIL/uL (ref 3.87–5.11)
RDW: 13.9 % (ref 11.5–15.5)
WBC: 8.5 10*3/uL (ref 4.0–10.5)

## 2016-12-25 LAB — BASIC METABOLIC PANEL
Anion gap: 7 (ref 5–15)
BUN: 20 mg/dL (ref 6–20)
CHLORIDE: 105 mmol/L (ref 101–111)
CO2: 25 mmol/L (ref 22–32)
Calcium: 8.1 mg/dL — ABNORMAL LOW (ref 8.9–10.3)
Creatinine, Ser: 0.6 mg/dL (ref 0.44–1.00)
GFR calc Af Amer: >60 mL/min (ref >60)
GFR calc non Af Amer: >60 mL/min (ref >60)
Glucose, Bld: 86 mg/dL (ref 65–99)
POTASSIUM: 3.5 mmol/L (ref 3.5–5.1)
SODIUM: 137 mmol/L (ref 135–145)

## 2016-12-25 MED ORDER — LORAZEPAM 2 MG/ML IJ SOLN
1.0000 mg | Freq: Once | INTRAMUSCULAR | Status: AC
  Administered 2016-12-25: 1 mg via INTRAVENOUS
  Filled 2016-12-25: qty 1

## 2016-12-25 NOTE — Progress Notes (Signed)
After pt urinated, she kept stating she felt like she needed to pee. Pt bladder scanned with 103mL urine in bladder. Will continue to monitor pt

## 2016-12-25 NOTE — Progress Notes (Signed)
Pts daughter called Rn in room to ask if she was not in room, would pt continue to get tangled up, take oxygen and monitor off? RN explained to daughter that RN and NT make hourly rounds and would straighten up the pt when needed.  Daughter stated pt is not sleeping and the "medication she was given is not working." Dr. Maudie Mercury paged to see if a one time dose of Ativan could be given. Waiting for call back/orders. Will continue to monitor pt

## 2016-12-25 NOTE — Progress Notes (Signed)
PROGRESS NOTE    Brittney Wall  SMO:[id number] DOB: 04/17/1945 DOA: 12/23/2016 PCP: [name], MD     Brief Narrative:  71 year old woman admitted to the hospital on 11/12 due to nausea and vomiting.  She has a history of dementia.  She was found to have small bowel obstruction secondary to hernia.  Was seen in consultation by general surgery and she subsequently underwent laparotomy with reduction of hernia and small bowel resection on 11/12.   Assessment & Plan:   Active Problems:   Hypertensive urgency   Schizophrenia ([place])   Dementia with behavioral disturbance   Protein-calorie malnutrition, severe (HCC)   Small bowel obstruction (HCC)   Hypokalemia   Ischemic bowel disease (HCC)   Small bowel obstruction -Status post ex lap, reduction of hernia, resection of small bowel due to ischemia and anastomosis with Dr. Constance Haw on 11/12. -Surgery on board, advancing diet to clears today.  Still no evidence of bowel function.  Hypertensive urgency -BP is improving with reinitiation of medications including hydralazine, minoxidil, is on IV as needed hydralazine as well as a clonidine patch.  Dementia with behavioral disturbance -Been receiving Haldol as needed.  Severe protein caloric malnutrition -Hopefully now that diet is being advanced can have increased oral intake.   DVT prophylaxis: Lovenox Code Status: Full code Family Communication: Patient only Disposition Plan: Pending return of bowel function  Consultants:   Surgery  Procedures:   Laparotomy with bowel resection and hernia repair in 11/12 by Dr. Constance Haw  Antimicrobials:  Anti-infectives (From admission, onward)   Start     Dose/Rate Route Frequency Ordered Stop   12/23/16 1030  cefoTEtan in Dextrose 5% (CEFOTAN) IVPB 2 g     2 g Intravenous On call to O.R. 12/23/16 0949 12/23/16 1134       Subjective: Lying in bed, confused  Objective: Vitals:   12/25/16 0611 12/25/16 0801 12/25/16 1029  12/25/16 1400  BP: (!) 164/58   (!) 173/49  Pulse: 95   97  Resp: 18   16  Temp: (!) 97.5 F (36.4 C)   98.1 F (36.7 C)  TempSrc: Axillary   Axillary  SpO2: 99% 98% 97% 97%  Weight:      Height:        Intake/Output Summary (Last 24 hours) at 12/25/2016 1720 Last data filed at 12/25/2016 0900 Gross per 24 hour  Intake 3187.5 ml  Output 450 ml  Net 2737.5 ml   Filed Weights   12/23/16 0201  Weight: 43.1 kg (95 lb)    Examination:  General exam: Awake, confused Respiratory system: Clear to auscultation. Respiratory effort normal. Cardiovascular system:RRR. No murmurs, rubs, gallops. Gastrointestinal system: Abdomen is nondistended, is apparently nontender, hypoactive bowel sounds at the  Central nervous system: Awake, confused Extremities: No C/C/E, +pedal pulses Skin: No rashes, lesions or ulcers Psychiatry: Unable to assess given dementia    Data Reviewed: I have personally reviewed following labs and imaging studies  CBC: Recent Labs  Lab 12/23/16 0330 12/24/16 0457 12/25/16 0421  WBC 7.0 11.0* 8.5  NEUTROABS 5.8  --   --   HGB 13.5 15.0 12.2  HCT 39.2 44.4 37.1  MCV 88.3 91.0 91.6  PLT 238 211 920   Basic Metabolic Panel: Recent Labs  Lab 12/23/16 0330 12/24/16 0457 12/25/16 0421  NA 140 140 137  K 3.4* 4.0 3.5  CL 107 102 105  CO2 26 28 25   GLUCOSE 115* 95 86  BUN 17 16 20   CREATININE  0.56 0.71 0.60  CALCIUM 9.2 8.4* 8.1*  MG  --  1.7  --   PHOS  --  3.3  --    GFR: Estimated Creatinine Clearance: 44.5 mL/min (by C-G formula based on SCr of 0.6 mg/dL). Liver Function Tests: Recent Labs  Lab 12/23/16 0330  AST 23  ALT 12*  ALKPHOS 53  BILITOT 0.6  PROT 7.3  ALBUMIN 4.0   Recent Labs  Lab 12/23/16 0330  LIPASE 28   No results for input(s): AMMONIA in the last 168 hours. Coagulation Profile: No results for input(s): INR, PROTIME in the last 168 hours. Cardiac Enzymes: No results for input(s): CKTOTAL, CKMB, CKMBINDEX,  TROPONINI in the last 168 hours. BNP (last 3 results) No results for input(s): PROBNP in the last 8760 hours. HbA1C: Recent Labs    12/24/16 0457  HGBA1C 5.6   CBG: No results for input(s): GLUCAP in the last 168 hours. Lipid Profile: No results for input(s): CHOL, HDL, LDLCALC, TRIG, CHOLHDL, LDLDIRECT in the last 72 hours. Thyroid Function Tests: No results for input(s): TSH, T4TOTAL, FREET4, T3FREE, THYROIDAB in the last 72 hours. Anemia Panel: No results for input(s): VITAMINB12, FOLATE, FERRITIN, TIBC, IRON, RETICCTPCT in the last 72 hours. Urine analysis:    Component Value Date/Time   COLORURINE YELLOW 12/23/2016 0231   APPEARANCEUR CLEAR 12/23/2016 0231   LABSPEC 1.014 12/23/2016 0231   PHURINE 7.0 12/23/2016 0231   GLUCOSEU NEGATIVE 12/23/2016 0231   HGBUR NEGATIVE 12/23/2016 0231   BILIRUBINUR NEGATIVE 12/23/2016 0231   KETONESUR NEGATIVE 12/23/2016 0231   PROTEINUR 100 (A) 12/23/2016 0231   UROBILINOGEN 0.2 03/12/2013 1350   NITRITE NEGATIVE 12/23/2016 0231   LEUKOCYTESUR NEGATIVE 12/23/2016 0231   Sepsis Labs: @LABRCNTIP (procalcitonin:4,lacticidven:4)  )No results found for this or any previous visit (from the past 240 hour(s)).       Radiology Studies: No results found.      Scheduled Meds: . cloNIDine  0.1 mg Transdermal Weekly  . enoxaparin (LOVENOX) injection  30 mg Subcutaneous Q24H  . haloperidol lactate  5 mg Intravenous QHS  . hydrALAZINE  20 mg Oral QHS  . minoxidil  5 mg Oral BID  . mirtazapine  30 mg Oral QHS   Continuous Infusions: . sodium chloride 125 mL/hr at 12/25/16 1144     LOS: 2 days    Time spent: 25 minutes. Greater than 50% of this time was spent in direct contact with the patient coordinating care.     Lelon Frohlich, MD [facility] Pager [phone]  If 7PM-7AM, please contact night-coverage [email or web address] Password [email or web address] 12/25/2016, 5:20 PM

## 2016-12-25 NOTE — Progress Notes (Signed)
[facility] Progress Note  2 Days Post-Op  Subjective: Doing fair. Lost IV last night. Had to have it replaced but required haldol and ativan to get this replaced. Confused in the room.   Objective: Vital signs in last 24 hours: Temp:  [97.5 F (36.4 C)-99.1 F (37.3 C)] 97.5 F (36.4 C) (11/14 0611) Pulse Rate:  [95-103] 95 (11/14 0611) Resp:  [16-18] 18 (11/14 0611) BP: (164-191)/(58-98) 164/58 (11/14 0611) SpO2:  [97 %-99 %] 97 % (11/14 1029) Last BM Date: 12/22/16  Intake/Output from previous day: 11/13 0701 - 11/14 0700 In: 3187.5 [P.O.:100; I.V.:3087.5] Out: 450 [Urine:450] Intake/Output this shift: No intake/output data recorded.  General appearance: alert, cachectic and mild distress Resp: normal work breathing GI: soft, appropriately tender, staples c/d/i with no erythema or drainage, nondistended Extremities: extremities normal, atraumatic, no cyanosis or edema  Lab Results:  Recent Labs    12/24/16 0457 12/25/16 0421  WBC 11.0* 8.5  HGB 15.0 12.2  HCT 44.4 37.1  PLT 211 191   BMET Recent Labs    12/24/16 0457 12/25/16 0421  NA 140 137  K 4.0 3.5  CL 102 105  CO2 28 25  GLUCOSE 95 86  BUN 16 20  CREATININE 0.71 0.60  CALCIUM 8.4* 8.1*    Anti-infectives: Anti-infectives (From admission, onward)   Start     Dose/Rate Route Frequency Ordered Stop   12/23/16 1030  cefoTEtan in Dextrose 5% (CEFOTAN) IVPB 2 g     2 g Intravenous On call to O.R. 12/23/16 0949 12/23/16 1134      Assessment/Plan: [name] POD 2 s/p Ex lap, SBR for ischemic bowel.  She is doing fair. Pathology with ischemic bowel.  -PRN for pain -Clear liquids   -Patient likes to ambulate per the daughter, might help her be more cooperative, OOB today     LOS: 2 days    Virl Cagey 12/25/2016

## 2016-12-25 NOTE — Progress Notes (Signed)
After paging Dr. Maudie Mercury, He ordered Ativan 1mg  IV, given. Pt and daughter educated on medication. Will continue to monitor pt

## 2016-12-26 NOTE — Progress Notes (Signed)
[facility] Progress Note  3 Days Post-Op  Subjective: Sitter with patient. Family not at bedside. No major complaints.   Objective: Vital signs in last 24 hours: Temp:  [98.1 F (36.7 C)-98.3 F (36.8 C)] 98.3 F (36.8 C) (11/15 0700) Pulse Rate:  [97-105] 101 (11/15 0700) Resp:  [16-17] 16 (11/15 0700) BP: (136-183)/(44-94) 183/94 (11/15 0700) SpO2:  [89 %-97 %] 95 % (11/15 0841) Last BM Date: 12/22/16  Intake/Output from previous day: 11/14 0701 - 11/15 0700 In: 1683.3 [I.V.:1683.3] Out: -  Intake/Output this shift: No intake/output data recorded.  General appearance: alert, cachectic, delirious and no distress Resp: normal work breathing GI: soft, nondistended, appropriate tender, staples c/d/i with no erythema or drainage Extremities: extremities normal, atraumatic, no cyanosis or edema  Lab Results:  Recent Labs    12/24/16 0457 12/25/16 0421  WBC 11.0* 8.5  HGB 15.0 12.2  HCT 44.4 37.1  PLT 211 191   BMET Recent Labs    12/24/16 0457 12/25/16 0421  NA 140 137  K 4.0 3.5  CL 102 105  CO2 28 25  GLUCOSE 95 86  BUN 16 20  CREATININE 0.71 0.60  CALCIUM 8.4* 8.1*    Anti-infectives: Anti-infectives (From admission, onward)   Start     Dose/Rate Route Frequency Ordered Stop   12/23/16 1030  cefoTEtan in Dextrose 5% (CEFOTAN) IVPB 2 g     2 g Intravenous On call to O.R. 12/23/16 0949 12/23/16 1134      Assessment/Plan: [name] POD 3 s/p Ex lap, SBR for ischemic bowel. She is doing fair. -PRN for pain -Clear liquids to continue until having bowel function/ flatus or BM -Updated Daughter, Brittney Wall by phone     LOS: 3 days    Virl Cagey 12/26/2016

## 2016-12-26 NOTE — Progress Notes (Signed)
PROGRESS NOTE    Brittney Wall  RCV:[id number] DOB: [birth year] DOA: 12/23/2016 PCP: [name], MD     Brief Narrative:  71 year old woman admitted to the hospital on 11/12 due to nausea and vomiting.  She has a history of dementia.  She was found to have small bowel obstruction secondary to hernia.  Was seen in consultation by general surgery and she subsequently underwent laparotomy with reduction of hernia and small bowel resection on 11/12.   Assessment & Plan:   Active Problems:   Hypertensive urgency   Schizophrenia ([place])   Dementia with behavioral disturbance   Protein-calorie malnutrition, severe (HCC)   Small bowel obstruction (HCC)   Hypokalemia   Ischemic bowel disease (HCC)   Small bowel obstruction -Status post ex lap, reduction of hernia, resection of small bowel due to ischemia and anastomosis with Dr. Constance Haw on 11/12. -Surgery on board, advancing diet to clears today.  Still no evidence of bowel function.  Hypertensive urgency -BP is improving with reinitiation of medications including hydralazine, minoxidil, is on IV as needed hydralazine as well as a clonidine patch.  Dementia with behavioral disturbance -Been receiving Haldol as needed.  Severe protein caloric malnutrition -Hopefully now that diet is being advanced can have increased oral intake.   DVT prophylaxis: Lovenox Code Status: Full code Family Communication: Patient only Disposition Plan: Pending return of bowel function  Consultants:   Surgery  Procedures:   Laparotomy with bowel resection and hernia repair in 11/12 by Dr. Constance Haw  Antimicrobials:  Anti-infectives (From admission, onward)   Start     Dose/Rate Route Frequency Ordered Stop   12/23/16 1030  cefoTEtan in Dextrose 5% (CEFOTAN) IVPB 2 g     2 g Intravenous On call to O.R. 12/23/16 0949 12/23/16 1134       Subjective: Lying in bed, confused, sitter at bedside  Objective: Vitals:   12/25/16 2100 12/26/16  0700 12/26/16 0841 12/26/16 1432  BP: (!) 136/44 (!) 183/94  (!) 195/73  Pulse: (!) 105 (!) 101  99  Resp: 17 16  18   Temp: 98.2 F (36.8 C) 98.3 F (36.8 C)  98.7 F (37.1 C)  TempSrc: Axillary Oral    SpO2: 95% 95% 95% 98%  Weight:      Height:        Intake/Output Summary (Last 24 hours) at 12/26/2016 1549 Last data filed at 12/26/2016 0933 Gross per 24 hour  Intake 1923.33 ml  Output -  Net 1923.33 ml   Filed Weights   12/23/16 0201  Weight: 43.1 kg (95 lb)    Examination:  General exam: Awake, confused Respiratory system: Clear to auscultation. Respiratory effort normal. Cardiovascular system:RRR. No murmurs, rubs, gallops. Gastrointestinal system: Abdomen is nondistended, is apparently nontender, hypoactive bowel sounds at the  Central nervous system: Awake, confused Extremities: No C/C/E, +pedal pulses Skin: No rashes, lesions or ulcers Psychiatry: Unable to assess given dementia    Data Reviewed: I have personally reviewed following labs and imaging studies  CBC: Recent Labs  Lab 12/23/16 0330 12/24/16 0457 12/25/16 0421  WBC 7.0 11.0* 8.5  NEUTROABS 5.8  --   --   HGB 13.5 15.0 12.2  HCT 39.2 44.4 37.1  MCV 88.3 91.0 91.6  PLT 238 211 102   Basic Metabolic Panel: Recent Labs  Lab 12/23/16 0330 12/24/16 0457 12/25/16 0421  NA 140 140 137  K 3.4* 4.0 3.5  CL 107 102 105  CO2 26 28 25   GLUCOSE 115* 95 86  BUN 17 16 20   CREATININE 0.56 0.71 0.60  CALCIUM 9.2 8.4* 8.1*  MG  --  1.7  --   PHOS  --  3.3  --    GFR: Estimated Creatinine Clearance: 44.5 mL/min (by C-G formula based on SCr of 0.6 mg/dL). Liver Function Tests: Recent Labs  Lab 12/23/16 0330  AST 23  ALT 12*  ALKPHOS 53  BILITOT 0.6  PROT 7.3  ALBUMIN 4.0   Recent Labs  Lab 12/23/16 0330  LIPASE 28   No results for input(s): AMMONIA in the last 168 hours. Coagulation Profile: No results for input(s): INR, PROTIME in the last 168 hours. Cardiac Enzymes: No  results for input(s): CKTOTAL, CKMB, CKMBINDEX, TROPONINI in the last 168 hours. BNP (last 3 results) No results for input(s): PROBNP in the last 8760 hours. HbA1C: Recent Labs    12/24/16 0457  HGBA1C 5.6   CBG: No results for input(s): GLUCAP in the last 168 hours. Lipid Profile: No results for input(s): CHOL, HDL, LDLCALC, TRIG, CHOLHDL, LDLDIRECT in the last 72 hours. Thyroid Function Tests: No results for input(s): TSH, T4TOTAL, FREET4, T3FREE, THYROIDAB in the last 72 hours. Anemia Panel: No results for input(s): VITAMINB12, FOLATE, FERRITIN, TIBC, IRON, RETICCTPCT in the last 72 hours. Urine analysis:    Component Value Date/Time   COLORURINE YELLOW 12/23/2016 0231   APPEARANCEUR CLEAR 12/23/2016 0231   LABSPEC 1.014 12/23/2016 0231   PHURINE 7.0 12/23/2016 0231   GLUCOSEU NEGATIVE 12/23/2016 0231   HGBUR NEGATIVE 12/23/2016 0231   BILIRUBINUR NEGATIVE 12/23/2016 0231   KETONESUR NEGATIVE 12/23/2016 0231   PROTEINUR 100 (A) 12/23/2016 0231   UROBILINOGEN 0.2 03/12/2013 1350   NITRITE NEGATIVE 12/23/2016 0231   LEUKOCYTESUR NEGATIVE 12/23/2016 0231   Sepsis Labs: @LABRCNTIP (procalcitonin:4,lacticidven:4)  )No results found for this or any previous visit (from the past 240 hour(s)).       Radiology Studies: No results found.      Scheduled Meds: . cloNIDine  0.1 mg Transdermal Weekly  . enoxaparin (LOVENOX) injection  30 mg Subcutaneous Q24H  . haloperidol lactate  5 mg Intravenous QHS  . hydrALAZINE  20 mg Oral QHS  . minoxidil  5 mg Oral BID  . mirtazapine  30 mg Oral QHS   Continuous Infusions: . sodium chloride 125 mL/hr at 12/26/16 1154     LOS: 3 days    Time spent: 25 minutes. Greater than 50% of this time was spent in direct contact with the patient coordinating care.     Lelon Frohlich, MD [facility] Pager [phone]  If 7PM-7AM, please contact night-coverage [email or web address] Password [email or web address] 12/26/2016, 3:49 PM

## 2016-12-26 NOTE — Progress Notes (Signed)
Patient passed gas as reported by NT/sitter.

## 2016-12-27 MED ORDER — CLONIDINE HCL 0.2 MG/24HR TD PTWK: 0.2000 mg | MEDICATED_PATCH | TRANSDERMAL | Status: DC

## 2016-12-27 NOTE — Care Management Important Message (Signed)
Important Message  Patient Details  Name: Brittney Wall MRN: [id number] Date of Birth: 07/15/1945   Medicare Important Message Given:  Yes    Aunica Dauphinee, Chauncey Reading, RN 12/27/2016, 10:17 AM

## 2016-12-27 NOTE — Evaluation (Signed)
Physical Therapy Evaluation Patient Details Name: Brittney Wall MRN: [id number] DOB: 11-24-1945 Today's Date: 12/27/2016   History of Present Illness  Brittney Wall is a 71 y.o. female with medical history of hypertension, dementia, bipolar disorder, schizophrenia, and hyperlipidemia presenting with 1 day history of lower abdominal pain with associated nausea and vomiting x2.  Due to the patient's dementia, she is unable to provide any history.  All of history is obtained from review of the chart and the patient's daughter at the bedside.  There is no reports of fevers, chills, headache, chest pain, shortness breath, diarrhea, hematochezia, melena, hematemesis.  The patient has not been started on any new medications.  The patient was also complaining of some vaginal pain.  There is no vaginal discharge or bleeding her daughter examined her perineum, she stated it was unremarkable.  Because of her abdominal pain and vomiting, the patient was brought to the emergency department for further evaluation    Clinical Impression  Patient's sister present and stated patient will not be able to use RW due to dementia and usually walks by herself at home, recommended we hold her hands for gait training.  Patient limited for gait secondary to fatigue and has tendency to lose balance once fatigued with near fall when returning back to bedside.  Patient's sister states the family shoulder be able to take care of her in her current functional state.  Patient will benefit from continued physical therapy in hospital and recommended venue below to increase strength, balance, endurance for safe ADLs and gait.    Follow Up Recommendations Home health PT;Supervision/Assistance - 24 hour    Equipment Recommendations  None recommended by PT    Recommendations for Other Services       Precautions / Restrictions Precautions Precautions: Fall Restrictions Weight Bearing Restrictions: No      Mobility  Bed  Mobility Overal bed mobility: Needs Assistance Bed Mobility: Supine to Sit;Sit to Supine     Supine to sit: Min assist Sit to supine: Min assist   General bed mobility comments: uses siderail to sit up  Transfers Overall transfer level: Needs assistance Equipment used: 2 person hand held assist Transfers: Sit to/from Stand           General transfer comment: Patient demonstrates poor return attempting to use RW, required 2 person assist  Ambulation/Gait Ambulation/Gait assistance: Mod assist;+2 safety/equipment Ambulation Distance (Feet): 50 Feet Assistive device: 2 person hand held assist Gait Pattern/deviations: Decreased step length - left;Decreased step length - right;Decreased stride length;Trunk flexed   Gait velocity interpretation: Below normal speed for age/gender General Gait Details: required 2 person assist mostly for safety due to patient's lack of awareness for safety, demonstrates labored cadence with flexed trunk, unable to stand fully upright possible due to weakness after surgery  Stairs            Wheelchair Mobility    Modified Rankin (Stroke Patients Only)       Balance Overall balance assessment: Needs assistance Sitting-balance support: No upper extremity supported;Feet unsupported Sitting balance-Leahy Scale: Good     Standing balance support: Bilateral upper extremity supported;During functional activity Standing balance-Leahy Scale: Fair Standing balance comment: supported with hand held assist                             Pertinent Vitals/Pain Pain Assessment: Faces Faces Pain Scale: No hurt    Home Living Family/patient expects to be discharged  to:: Private residence Living Arrangements: Children Available Help at Discharge: Family(her sister and daughter ) Type of Home: House Home Access: Level entry     Home Layout: [address] [place]: Wheelchair - manual;Bedside commode;Shower seat;Hospital bed       Prior Function Level of Independence: Needs assistance   Gait / Transfers Assistance Needed: patient ambulates household distances without assistive device with supervision  ADL's / Homemaking Assistance Needed: assisted by family        Hand Dominance        Extremity/Trunk Assessment   Upper Extremity Assessment Upper Extremity Assessment: Generalized weakness    Lower Extremity Assessment Lower Extremity Assessment: Generalized weakness    Cervical / Trunk Assessment Cervical / Trunk Assessment: Kyphotic  Communication   Communication: No difficulties  Cognition Arousal/Alertness: Awake/alert Behavior During Therapy: Restless;Agitated Overall Cognitive Status: History of cognitive impairments - at baseline                                        General Comments      Exercises     Assessment/Plan    PT Assessment Patient needs continued PT services  PT Problem List Decreased strength;Decreased activity tolerance;Decreased balance;Decreased mobility;Decreased safety awareness       PT Treatment Interventions Gait training;Functional mobility training;Therapeutic activities;Therapeutic exercise;Patient/family education    PT Goals (Current goals can be found in the Care Plan section)  Acute Rehab PT Goals Patient Stated Goal: return home PT Goal Formulation: With patient/family Time For Goal Achievement: 01/03/17 Potential to Achieve Goals: Good    Frequency Min 3X/week   Barriers to discharge        Co-evaluation               AM-PAC PT "6 Clicks" Daily Activity  Outcome Measure Difficulty turning over in bed (including adjusting bedclothes, sheets and blankets)?: A Little Difficulty moving from lying on back to sitting on the side of the bed? : A Little Difficulty sitting down on and standing up from a chair with arms (e.g., wheelchair, bedside commode, etc,.)?: A Lot Help needed moving to and from a bed to chair (including  a wheelchair)?: A Lot Help needed walking in hospital room?: A Lot Help needed climbing 3-5 steps with a railing? : Total 6 Click Score: 13    End of Session Equipment Utilized During Treatment: Gait belt Activity Tolerance: Patient tolerated treatment well;Patient limited by fatigue Patient left: in bed;with call bell/phone within reach;with nursing/sitter in room;with family/visitor present Nurse Communication: Mobility status PT Visit Diagnosis: Unsteadiness on feet (R26.81);Other abnormalities of gait and mobility (R26.89);Muscle weakness (generalized) (M62.81)    Time: 0630-1601 PT Time Calculation (min) (ACUTE ONLY): 25 min   Charges:   PT Evaluation $PT Eval Moderate Complexity: 1 Mod PT Treatments $Therapeutic Activity: 8-22 mins   PT G Codes:        3:38 PM, 01-20-2017 Lonell Grandchild, MPT Physical Therapist with [facility] 336 [phone] office [phone] mobile phone

## 2016-12-27 NOTE — Plan of Care (Signed)
  Acute Rehab PT Goals(only PT should resolve) Pt Will Go Supine/Side To Sit 12/27/2016 1541 - Progressing by Lonell Grandchild, PT Flowsheets Taken 12/27/2016 1541  Pt will go Supine/Side to Sit with supervision Patient Will Transfer Sit To/From Stand 12/27/2016 1541 - Progressing by Lonell Grandchild, PT Flowsheets Taken 12/27/2016 1541  Patient will transfer sit to/from stand with minimal assist Pt Will Transfer Bed To Chair/Chair To Bed 12/27/2016 1541 - Progressing by Lonell Grandchild, PT Flowsheets Taken 12/27/2016 1541  Pt will Transfer Bed to Chair/Chair to Bed with min assist Pt Will Ambulate 12/27/2016 1541 - Progressing by Lonell Grandchild, PT Flowsheets Taken 12/27/2016 1541  Pt will Ambulate 75 feet;with minimal assist Note With hand held assist  3:42 PM, 12/27/16 Lonell Grandchild, MPT Physical Therapist with [facility] 336 [phone] office [phone] mobile phone

## 2016-12-27 NOTE — Progress Notes (Signed)
[facility] Progress Note  4 Days Post-Op  Subjective: Tolerated clears and sitter reporting flatus. No complaints per the patient.   Objective: Vital signs in last 24 hours: Temp:  [98.1 F (36.7 C)-98.7 F (37.1 C)] 98.6 F (37 C) (11/16 0704) Pulse Rate:  [92-101] 101 (11/16 0704) Resp:  [18] 18 (11/16 0704) BP: (147-195)/(73-89) 147/89 (11/16 0704) SpO2:  [96 %-100 %] 96 % (11/16 0848) Last BM Date: 12/26/16  Intake/Output from previous day: 11/15 0701 - 11/16 0700 In: 240 [P.O.:240] Out: -  Intake/Output this shift: No intake/output data recorded.  General appearance: alert, cachectic and no distress Resp: normal work breathing GI: soft, minimally tender, nondistended, staples c/d/i with no erythema or drainage Extremities: extremities normal, atraumatic, no cyanosis or edema  Lab Results:  Recent Labs    12/25/16 0421  WBC 8.5  HGB 12.2  HCT 37.1  PLT 191   BMET Recent Labs    12/25/16 0421  NA 137  K 3.5  CL 105  CO2 25  GLUCOSE 86  BUN 20  CREATININE 0.60  CALCIUM 8.1*   PT/INR No results for input(s): LABPROT, INR in the last 72 hours.  Studies/Results: No results found.  Anti-infectives: Anti-infectives (From admission, onward)   Start     Dose/Rate Route Frequency Ordered Stop   12/23/16 1030  cefoTEtan in Dextrose 5% (CEFOTAN) IVPB 2 g     2 g Intravenous On call to O.R. 12/23/16 0949 12/23/16 1134      Assessment/Plan: Ms. Belmonte is POD 4 s/p Ex lap, SBR for ischemic bowel from incarcerated, strangulated obturator hernia. Doing fair.  -Diet as tolerated -PRN for pain   LOS: 4 days    Virl Cagey 12/27/2016

## 2016-12-27 NOTE — Progress Notes (Signed)
PROGRESS NOTE    Brittney Wall  QMV:[id number] DOB: 1945-09-01 DOA: 12/23/2016 PCP: [name], MD     Brief Narrative:  71 year old woman admitted to the hospital on 11/12 due to nausea and vomiting.  She has a history of dementia.  She was found to have small bowel obstruction secondary to hernia.  Was seen in consultation by general surgery and she subsequently underwent laparotomy with reduction of hernia and small bowel resection on 11/12.  Advancing diet as of 11/16.   Assessment & Plan:   Active Problems:   Hypertensive urgency   Schizophrenia ([place])   Dementia with behavioral disturbance   Protein-calorie malnutrition, severe (HCC)   Small bowel obstruction (HCC)   Hypokalemia   Ischemic bowel disease (HCC)   Small bowel obstruction -Status post ex lap, reduction of hernia, resection of small bowel due to ischemia and anastomosis with Dr. Constance Haw on 11/12. -Surgery on board. -Tolerating clears, will advance diet to soft today.   -If tolerates new diet, anticipate discharge home over the next 24-48 hours.  Hypertensive urgency -BP remains high despite reinitiation of medications including hydralazine, minoxidil, is on IV as needed hydralazine as well as a clonidine patch. -Will increase clonidine patch to 0.2 mg.  Dementia with behavioral disturbance -Been receiving Haldol as needed.  Severe protein caloric malnutrition -Hopefully now that diet is being advanced can have increased oral intake.   DVT prophylaxis: Lovenox Code Status: Full code Family Communication: Patient only Disposition Plan: Home in 24-48 hours with [facility] services.  Consultants:   Surgery  Procedures:   Laparotomy with bowel resection and hernia repair in 11/12 by Dr. Constance Haw  Antimicrobials:  Anti-infectives (From admission, onward)   Start     Dose/Rate Route Frequency Ordered Stop   12/23/16 1030  cefoTEtan in Dextrose 5% (CEFOTAN) IVPB 2 g     2 g Intravenous On call to O.R.  12/23/16 0949 12/23/16 1134       Subjective: Lying in bed, confused, sitter at bedside  Objective: Vitals:   12/26/16 2300 12/27/16 0704 12/27/16 0848 12/27/16 1408  BP:  (!) 147/89  (!) 193/73  Pulse: 92 (!) 101  (!) 106  Resp: 18 18  20   Temp: 98.1 F (36.7 C) 98.6 F (37 C)  98.2 F (36.8 C)  TempSrc: Oral Axillary  Axillary  SpO2: 100% 97% 96% 99%  Weight:      Height:        Intake/Output Summary (Last 24 hours) at 12/27/2016 1548 Last data filed at 12/27/2016 1400 Gross per 24 hour  Intake 3235 ml  Output -  Net 3235 ml   Filed Weights   12/23/16 0201  Weight: 43.1 kg (95 lb)    Examination:  General exam: Alert, confused Respiratory system: Clear to auscultation. Respiratory effort normal. Cardiovascular system:RRR. No murmurs, rubs, gallops. Gastrointestinal system: Abdomen is nondistended, soft and nontender. No organomegaly or masses felt. Normal bowel sounds heard. Central nervous system: Confused, moves all 4 spontaneously Extremities: No C/C/E, +pedal pulses Skin: No rashes, lesions or ulcers Psychiatry: Unable to assess given current mental state.     Data Reviewed: I have personally reviewed following labs and imaging studies  CBC: Recent Labs  Lab 12/23/16 0330 12/24/16 0457 12/25/16 0421  WBC 7.0 11.0* 8.5  NEUTROABS 5.8  --   --   HGB 13.5 15.0 12.2  HCT 39.2 44.4 37.1  MCV 88.3 91.0 91.6  PLT 238 211 284   Basic Metabolic Panel: Recent Labs  Lab 12/23/16 0330 12/24/16 0457 12/25/16 0421  NA 140 140 137  K 3.4* 4.0 3.5  CL 107 102 105  CO2 26 28 25   GLUCOSE 115* 95 86  BUN 17 16 20   CREATININE 0.56 0.71 0.60  CALCIUM 9.2 8.4* 8.1*  MG  --  1.7  --   PHOS  --  3.3  --    GFR: Estimated Creatinine Clearance: 44.5 mL/min (by C-G formula based on SCr of 0.6 mg/dL). Liver Function Tests: Recent Labs  Lab 12/23/16 0330  AST 23  ALT 12*  ALKPHOS 53  BILITOT 0.6  PROT 7.3  ALBUMIN 4.0   Recent Labs  Lab  12/23/16 0330  LIPASE 28   No results for input(s): AMMONIA in the last 168 hours. Coagulation Profile: No results for input(s): INR, PROTIME in the last 168 hours. Cardiac Enzymes: No results for input(s): CKTOTAL, CKMB, CKMBINDEX, TROPONINI in the last 168 hours. BNP (last 3 results) No results for input(s): PROBNP in the last 8760 hours. HbA1C: No results for input(s): HGBA1C in the last 72 hours. CBG: No results for input(s): GLUCAP in the last 168 hours. Lipid Profile: No results for input(s): CHOL, HDL, LDLCALC, TRIG, CHOLHDL, LDLDIRECT in the last 72 hours. Thyroid Function Tests: No results for input(s): TSH, T4TOTAL, FREET4, T3FREE, THYROIDAB in the last 72 hours. Anemia Panel: No results for input(s): VITAMINB12, FOLATE, FERRITIN, TIBC, IRON, RETICCTPCT in the last 72 hours. Urine analysis:    Component Value Date/Time   COLORURINE YELLOW 12/23/2016 0231   APPEARANCEUR CLEAR 12/23/2016 0231   LABSPEC 1.014 12/23/2016 0231   PHURINE 7.0 12/23/2016 0231   GLUCOSEU NEGATIVE 12/23/2016 0231   HGBUR NEGATIVE 12/23/2016 0231   BILIRUBINUR NEGATIVE 12/23/2016 0231   KETONESUR NEGATIVE 12/23/2016 0231   PROTEINUR 100 (A) 12/23/2016 0231   UROBILINOGEN 0.2 03/12/2013 1350   NITRITE NEGATIVE 12/23/2016 0231   LEUKOCYTESUR NEGATIVE 12/23/2016 0231   Sepsis Labs: @LABRCNTIP (procalcitonin:4,lacticidven:4)  )No results found for this or any previous visit (from the past 240 hour(s)).       Radiology Studies: No results found.      Scheduled Meds: . cloNIDine  0.1 mg Transdermal Weekly  . enoxaparin (LOVENOX) injection  30 mg Subcutaneous Q24H  . haloperidol lactate  5 mg Intravenous QHS  . hydrALAZINE  20 mg Oral QHS  . minoxidil  5 mg Oral BID  . mirtazapine  30 mg Oral QHS   Continuous Infusions: . sodium chloride 125 mL/hr at 12/27/16 1240     LOS: 4 days    Time spent: 25 minutes. Greater than 50% of this time was spent in direct contact with the  patient coordinating care.     Lelon Frohlich, MD [facility] Pager [phone]  If 7PM-7AM, please contact night-coverage [email or web address] Password [facility] 12/27/2016, 3:48 PM

## 2016-12-28 DIAGNOSIS — F2089 Other schizophrenia: Secondary | ICD-10-CM

## 2016-12-28 MED ORDER — HALOPERIDOL LACTATE 5 MG/ML IJ SOLN: 5.0000 mg | Freq: Four times a day (QID) | INTRAMUSCULAR | Status: DC | PRN

## 2016-12-28 MED ORDER — OXYCODONE HCL 5 MG PO TABS: 5.0000 mg | ORAL_TABLET | Freq: Four times a day (QID) | ORAL | 0 refills | Status: DC | PRN

## 2016-12-28 MED ORDER — HALOPERIDOL 5 MG PO TABS
5.0000 mg | ORAL_TABLET | Freq: Four times a day (QID) | ORAL | Status: DC | PRN
  Administered 2016-12-28: 5 mg via ORAL
  Filled 2016-12-28: qty 1

## 2016-12-28 NOTE — Progress Notes (Signed)
Patient ordered [facility] PT, LM w daughter to call back to set up prior to DC. Awaiting callback. Pauli,Erika Daughter [phone]

## 2016-12-28 NOTE — Progress Notes (Signed)
[facility] Progress Note  5 Days Post-Op  Subjective: Continued flatus reported by RN overnight. No major complaints from patient, drinking milk. Took in mostly ensure and liquids this AM but some food.  No BMs reported and no nausea.   Objective: Vital signs in last 24 hours: Temp:  [98.2 F (36.8 C)-98.3 F (36.8 C)] 98.3 F (36.8 C) (11/16 2029) Pulse Rate:  [106-109] 107 (11/17 0915) Resp:  [18-20] 18 (11/16 2029) BP: (129-193)/(59-73) 162/65 (11/17 0915) SpO2:  [94 %-99 %] 99 % (11/17 0915) Last BM Date: 12/26/16  Intake/Output from previous day: 11/16 0701 - 11/17 0700 In: 1600 [P.O.:600; I.V.:1000] Out: -  Intake/Output this shift: No intake/output data recorded.  General appearance: alert, cooperative and no distress Resp: normal work breathing GI: soft, minimally distended, staples c/d/i with no erythema or drainage Extremities: extremities normal, atraumatic, no cyanosis or edema   Assessment/Plan: Ms. Gomes is POD 5 s/p Ex lap, SBR for ischemic bowel from incarcerated, strangulated obturator hernia. Looking good this AM. -PRN for pain -Diet as tolerated  -Needs to have a BM prior to discharge  -Will update Dr. Wynetta Emery.    LOS: 5 days    Virl Cagey 12/28/2016

## 2016-12-28 NOTE — Progress Notes (Addendum)
AVS reviewed with patient's sister.  Patient's daughter stated that patient's sister, Helene Kelp, could take patient home.  Sister verbalized understanding of discharge instructions, physician follow-up, medications.  Prescription for pain medication given to sister.  Clonidine patch removed prior to patient discharge.  Patient transported by NT via w/c to main entrance for discharge.  Patient stable at discharge.

## 2016-12-28 NOTE — Discharge Summary (Signed)
Physician Discharge Summary  Brittney Wall:[id number] DOB: Aug 29, 1945 DOA: 12/23/2016  PCP: [name], MD  Admit date: 12/23/2016 Discharge date: 12/28/2016  Admitted From: Home  Disposition: Home   Recommendations for Outpatient Follow-up:  1. Follow up with PCP in 1 weeks 2. Follow up with surgery in 2 weeks for recheck 3. Please obtain BMP/CBC in one week 4. Please refer to outpatient dietitian.    Discharge Condition: STABLE   CODE STATUS: FULL    Home health PT arranged.   Brief Hospitalization Summary: Please see all hospital notes, images, labs for full details of the hospitalization. From admission:  HPI:  Brittney Wall is a 71 y.o. female with medical history of hypertension, dementia, bipolar disorder, schizophrenia, and hyperlipidemia presenting with 1 day history of lower abdominal pain with associated nausea and vomiting x2.  Due to the patient's dementia, she is unable to provide any history.  All of history is obtained from review of the chart and the patient's daughter at the bedside.  There is no reports of fevers, chills, headache, chest pain, shortness breath, diarrhea, hematochezia, melena, hematemesis.  The patient has not been started on any new medications.  The patient was also complaining of some vaginal pain.  There is no vaginal discharge or bleeding her daughter examined her perineum, she stated it was unremarkable.  Because of her abdominal pain and vomiting, the patient was brought to the emergency department for further evaluation  The patient was afebrile hemodynamically stable saturating 100% on room air.  BMP, LFTs, CBC were unremarkable in the emergency department.  CT of the abdomen and pelvis revealed a small focal obturator hernia on the right involving the mid to distal ileum with distal decompression suggestive of SBO secondary to obturator hernia.  General surgery was consulted.  Small bowel obstruction -Status post ex lap, reduction  of hernia, resection of small bowel due to ischemia and anastomosis with Dr. Constance Haw on 11/12. -Surgery on board. -Tolerating clears, will advance diet to soft today.   -Pt has tolerated soft diet and has had a bowel movement.  I discussed with the surgeon and ok to discharge home. Will recommend follow up with PCP within a week and surgery in 2 weeks.   Leukocytosis - resolved now.    Hypertensive urgency -BP remains high despite reinitiation of medications including hydralazine, minoxidil, was on IV as needed hydralazine as well as a clonidine patch. -resume home blood pressure medications, follow up with PCP to recheck blood pressure and adjust therapy as needed.  The clonidine patch was discontinued prior to discharge as she will resume home oral clonidine.   Dementia with behavioral disturbance -resume home medications, haldol used in hospital as needed.   Severe protein caloric malnutrition -recommend outpatient referral to dietitian.    DVT prophylaxis: Lovenox Code Status: Full code Family Communication: Patient only Disposition Plan: Home  Consultants:   Surgery  Procedures:   Laparotomy with bowel resection and hernia repair in 11/12 by Dr. Constance Haw  Discharge Diagnoses:  Active Problems:   Hypertensive urgency   Schizophrenia (HCC)   Dementia with behavioral disturbance   Protein-calorie malnutrition, severe (HCC)   Small bowel obstruction (HCC)   Hypokalemia   Ischemic bowel disease [facility])  Discharge Instructions: Discharge Instructions    Call MD for:  difficulty breathing, headache or visual disturbances   Complete by:  As directed    Call MD for:  extreme fatigue   Complete by:  As directed  Call MD for:  hives   Complete by:  As directed    Call MD for:  persistant dizziness or light-headedness   Complete by:  As directed    Call MD for:  persistant nausea and vomiting   Complete by:  As directed    Call MD for:  severe uncontrolled pain    Complete by:  As directed    Increase activity slowly   Complete by:  As directed      Allergies as of 12/28/2016   No Known Allergies     Medication List    TAKE these medications   benzonatate 100 MG capsule Commonly known as:  TESSALON Take 100 mg every 8 (eight) hours as needed by mouth for cough.   cloNIDine 0.1 MG tablet Commonly known as:  CATAPRES Take 0.1 mg by mouth 3 (three) times daily.   haloperidol 5 MG tablet Commonly known as:  HALDOL Take 5 mg by mouth at bedtime.   hydrALAZINE 10 MG tablet Commonly known as:  APRESOLINE Take 2 tablets (20 mg total) by mouth at bedtime.   LORazepam 0.5 MG tablet Commonly known as:  ATIVAN Take 0.5 mg by mouth at bedtime as needed.   megestrol 40 MG/ML suspension Commonly known as:  MEGACE Take 800 mg by mouth daily. For appetite   minoxidil 2.5 MG tablet Commonly known as:  LONITEN Take 2 tablets (5 mg total) by mouth 2 (two) times daily.   mirtazapine 30 MG tablet Commonly known as:  REMERON Take 30 mg by mouth at bedtime.   oxyCODONE 5 MG immediate release tablet Commonly known as:  Oxy IR/ROXICODONE Take 1 tablet (5 mg total) every 6 (six) hours as needed by mouth for severe pain.   pravastatin 20 MG tablet Commonly known as:  PRAVACHOL Take 20 mg by mouth at bedtime.   traZODone 100 MG tablet Commonly known as:  DESYREL Take 200 mg by mouth at bedtime as needed for sleep.      Follow-up Information    [name], MD. Schedule an appointment as soon as possible for a visit in 1 week(s).   Specialty:  Family Medicine Why:  Hospital Follow Up  Contact information: [address] [place] [address] [phone]        Virl Cagey, MD. Schedule an appointment as soon as possible for a visit in 2 week(s).   Specialty:  General Surgery Contact information: [address] [place] [facility] [address] [phone]          No Known Allergies Current Discharge Medication List     START taking these medications   Details  oxyCODONE (OXY IR/ROXICODONE) 5 MG immediate release tablet Take 1 tablet (5 mg total) every 6 (six) hours as needed by mouth for severe pain. Qty: 10 tablet, Refills: 0      CONTINUE these medications which have NOT CHANGED   Details  benzonatate (TESSALON) 100 MG capsule Take 100 mg every 8 (eight) hours as needed by mouth for cough.    cloNIDine (CATAPRES) 0.1 MG tablet Take 0.1 mg by mouth 3 (three) times daily.      haloperidol (HALDOL) 5 MG tablet Take 5 mg by mouth at bedtime.     hydrALAZINE (APRESOLINE) 10 MG tablet Take 2 tablets (20 mg total) by mouth at bedtime. Qty: 60 tablet, Refills: 2    LORazepam (ATIVAN) 0.5 MG tablet Take 0.5 mg by mouth at bedtime as needed.    megestrol (MEGACE)  40 MG/ML suspension Take 800 mg by mouth daily. For appetite    minoxidil (LONITEN) 2.5 MG tablet Take 2 tablets (5 mg total) by mouth 2 (two) times daily. Qty: 60 tablet, Refills: 0    mirtazapine (REMERON) 30 MG tablet Take 30 mg by mouth at bedtime.      pravastatin (PRAVACHOL) 20 MG tablet Take 20 mg by mouth at bedtime.     traZODone (DESYREL) 100 MG tablet Take 200 mg by mouth at bedtime as needed for sleep.        Procedures/Studies: Ct Abdomen Pelvis W Contrast  Result Date: 12/23/2016 CLINICAL DATA:  Acute onset of sharp intermittent right groin and pelvic pain. Nausea and vomiting. EXAM: CT ABDOMEN AND PELVIS WITH CONTRAST TECHNIQUE: Multidetector CT imaging of the abdomen and pelvis was performed using the standard protocol following bolus administration of intravenous contrast. CONTRAST:  116mL ISOVUE-300 IOPAMIDOL (ISOVUE-300) INJECTION 61% COMPARISON:  CT of the abdomen and pelvis performed 09/05/2016 FINDINGS: Lower chest: The visualized lung bases are grossly clear. Mild cardiomegaly is noted. Diffuse coronary artery calcifications are seen. Trace pericardial fluid likely remains within normal limits. Hepatobiliary: The  liver is unremarkable in appearance. The gallbladder is unremarkable in appearance. The common bile duct remains normal in caliber. Pancreas: The pancreas is within normal limits. Spleen: The spleen is unremarkable in appearance. Adrenals/Urinary Tract: A 2.0 cm left adrenal nodule is again noted. Small left renal cysts are noted. The kidneys are otherwise grossly unremarkable. There is no evidence of hydronephrosis. No renal or ureteral stones are identified. No significant perinephric stranding is seen. There is suggestion of vague adjacent retroperitoneal edema, which may reflect mild anasarca. Stomach/Bowel: There is distention of small bowel loops to 2.8 cm in maximal diameter, with free fluid noted at the lower abdomen and pelvis. There appears to be a small focal obturator hernia on the right, involving mid to distal ileum, with distal decompression of small bowel loops. This reflects small bowel obstruction secondary to the obturator hernia. There is residual air within portions of the colon. The colon is grossly unremarkable in appearance, though difficult to fully assess due to surrounding bowel loops. Vascular/Lymphatic: Diffuse calcification is seen along the abdominal aorta and its branches. The abdominal aorta is otherwise grossly unremarkable. The inferior vena cava is grossly unremarkable. No retroperitoneal lymphadenopathy is seen. No pelvic sidewall lymphadenopathy is identified. Reproductive: The bladder is mildly distended and grossly unremarkable. Uterine fibroids are noted. No suspicious adnexal masses are seen. Other: No additional soft tissue abnormalities are seen. Musculoskeletal: No acute osseous abnormalities are identified. There is stable chronic compression deformity of vertebral body L5. The visualized musculature is unremarkable in appearance. IMPRESSION: 1. Small bowel obstruction noted secondary to a small focal right-sided obturator hernia involving mid to distal ileum, with  distal decompression of small bowel loops. Associated distention of more proximal small bowel loops, with free fluid at the lower abdomen and pelvis. 2.  Aortic Atherosclerosis (ICD10-I70.0). 3. Small left renal cysts. 4. 2.0 cm left adrenal nodule again noted. 5. Mild cardiomegaly.  Diffuse coronary artery calcifications. 6. Uterine fibroids noted. These results were called by telephone at the time of interpretation on 12/23/2016 at 6:12 am to Dr. Ezequiel Essex, who verbally acknowledged these results. Electronically Signed   By: Garald Balding M.D.   On: 12/23/2016 06:13   Dg Chest Port 1 View  Result Date: 12/23/2016 CLINICAL DATA:  Preop for small bowel obstruction. EXAM: PORTABLE CHEST 1 VIEW COMPARISON:  09/07/2016. FINDINGS: The  heart size and mediastinal contours are within normal limits. Both lungs are clear. The visualized skeletal structures are unremarkable. Nasogastric tube is in the stomach. Compared with priors, aeration is improved. IMPRESSION: No active disease. Electronically Signed   By: Staci Righter M.D.   On: 12/23/2016 11:03     Subjective: Pt has tolerated her soft diet and she has had a bowel movement.  She is asking about going home.   Discharge Exam: Vitals:   12/27/16 2029 12/28/16 0915  BP: (!) 129/59 (!) 162/65  Pulse: (!) 109 (!) 107  Resp: 18   Temp: 98.3 F (36.8 C)   SpO2: 94% 99%   Vitals:   12/27/16 1408 12/27/16 2004 12/27/16 2029 12/28/16 0915  BP: (!) 193/73  (!) 129/59 (!) 162/65  Pulse: (!) 106  (!) 109 (!) 107  Resp: 20  18   Temp: 98.2 F (36.8 C)  98.3 F (36.8 C)   TempSrc: Axillary  Oral   SpO2: 99% 94% 94% 99%  Weight:      Height:       General: Pt is alert, awake, not in acute distress Cardiovascular: normal S1/S2 +, no rubs, no gallops Respiratory: CTA bilaterally, no wheezing, no rhonchi Abdominal: Soft, NT, ND, bowel sounds + Extremities: no edema, no cyanosis   The results of significant diagnostics from this  hospitalization (including imaging, microbiology, ancillary and laboratory) are listed below for reference.    Microbiology: No results found for this or any previous visit (from the past 240 hour(s)).   Labs: BNP (last 3 results) Recent Labs    06/14/16 1623  BNP 852.7*   Basic Metabolic Panel: Recent Labs  Lab 12/23/16 0330 12/24/16 0457 12/25/16 0421  NA 140 140 137  K 3.4* 4.0 3.5  CL 107 102 105  CO2 26 28 25   GLUCOSE 115* 95 86  BUN 17 16 20   CREATININE 0.56 0.71 0.60  CALCIUM 9.2 8.4* 8.1*  MG  --  1.7  --   PHOS  --  3.3  --    Liver Function Tests: Recent Labs  Lab 12/23/16 0330  AST 23  ALT 12*  ALKPHOS 53  BILITOT 0.6  PROT 7.3  ALBUMIN 4.0   Recent Labs  Lab 12/23/16 0330  LIPASE 28   No results for input(s): AMMONIA in the last 168 hours. CBC: Recent Labs  Lab 12/23/16 0330 12/24/16 0457 12/25/16 0421  WBC 7.0 11.0* 8.5  NEUTROABS 5.8  --   --   HGB 13.5 15.0 12.2  HCT 39.2 44.4 37.1  MCV 88.3 91.0 91.6  PLT 238 211 191   Cardiac Enzymes: No results for input(s): CKTOTAL, CKMB, CKMBINDEX, TROPONINI in the last 168 hours. BNP: Invalid input(s): POCBNP CBG: No results for input(s): GLUCAP in the last 168 hours. D-Dimer No results for input(s): DDIMER in the last 72 hours. Hgb A1c No results for input(s): HGBA1C in the last 72 hours. Lipid Profile No results for input(s): CHOL, HDL, LDLCALC, TRIG, CHOLHDL, LDLDIRECT in the last 72 hours. Thyroid function studies No results for input(s): TSH, T4TOTAL, T3FREE, THYROIDAB in the last 72 hours.  Invalid input(s): FREET3 Anemia work up No results for input(s): VITAMINB12, FOLATE, FERRITIN, TIBC, IRON, RETICCTPCT in the last 72 hours. Urinalysis    Component Value Date/Time   COLORURINE YELLOW 12/23/2016 0231   APPEARANCEUR CLEAR 12/23/2016 0231   LABSPEC 1.014 12/23/2016 0231   PHURINE 7.0 12/23/2016 0231   GLUCOSEU NEGATIVE 12/23/2016 0231   HGBUR  NEGATIVE 12/23/2016 0231    BILIRUBINUR NEGATIVE 12/23/2016 0231   KETONESUR NEGATIVE 12/23/2016 0231   PROTEINUR 100 (A) 12/23/2016 0231   UROBILINOGEN 0.2 03/12/2013 1350   NITRITE NEGATIVE 12/23/2016 0231   LEUKOCYTESUR NEGATIVE 12/23/2016 0231   Sepsis Labs Invalid input(s): PROCALCITONIN,  WBC,  LACTICIDVEN Microbiology No results found for this or any previous visit (from the past 240 hour(s)).  Time coordinating discharge:  37 minutes  SIGNED:  Irwin Brakeman, MD  [facility] 12/28/2016, 10:59 AM Pager [phone]  If 7PM-7AM, please contact night-coverage [email or web address] Password [email or web address]

## 2016-12-28 NOTE — Discharge Instructions (Signed)
Follow with Primary MD  [name], MD  and other consultant's as instructed your Hospitalist MD  Please get a complete blood count and chemistry panel checked by your Primary MD at your next visit, and again as instructed by your Primary MD.  Get Medicines reviewed and adjusted: Please take all your medications with you for your next visit with your Primary MD  Laboratory/radiological data: Please request your Primary MD to go over all hospital tests and procedure/radiological results at the follow up, please ask your Primary MD to get all Hospital records sent to his/her office.  In some cases, they will be blood work, cultures and biopsy results pending at the time of your discharge. Please request that your primary care M.D. follows up on these results.  Also Note the following: If you experience worsening of your admission symptoms, develop shortness of breath, life threatening emergency, suicidal or homicidal thoughts you must seek medical attention immediately by calling 911 or calling your MD immediately  if symptoms less severe.  You must read complete instructions/literature along with all the possible adverse reactions/side effects for all the Medicines you take and that have been prescribed to you. Take any new Medicines after you have completely understood and accpet all the possible adverse reactions/side effects.   Do not drive when taking Pain medications or sleeping medications (Benzodaizepines)  Do not take more than prescribed Pain, Sleep and Anxiety Medications. It is not advisable to combine anxiety,sleep and pain medications without talking with your primary care practitioner  Special Instructions: If you have smoked or chewed Tobacco  in the last 2 yrs please stop smoking, stop any regular Alcohol  and or any Recreational drug use.  Wear Seat belts while driving.  Please note: You were cared for by a hospitalist during your hospital stay. Once you are discharged,  your primary care physician will handle any further medical issues. Please note that NO REFILLS for any discharge medications will be authorized once you are discharged, as it is imperative that you return to your primary care physician (or establish a relationship with a primary care physician if you do not have one) for your post hospital discharge needs so that they can reassess your need for medications and monitor your lab values.

## 2016-12-28 NOTE — Care Management Note (Signed)
Case Management Note  Patient Details  Name: Brittney Wall MRN: [id number] Date of Birth: [birth year]  Subjective/Objective:                 Spoke to daughter who was agreeable to [facility], would like to use [facility], referral accepted by Kerby Less. No DME needs per daughter or PT notes. CM signing off.   Action/Plan:   Expected Discharge Date:  12/28/16               Expected Discharge Plan:  [place]  In-House Referral:     Discharge planning Services  CM Consult  Post Acute Care Choice:    Choice offered to:     DME Arranged:    DME Agency:     HH Arranged:  PT HH Agency:  [place]  Status of Service:  Completed, signed off  If discussed at [place] of Stay Meetings, dates discussed:    Additional Comments:  Carles Collet, RN 12/28/2016, 11:42 AM

## 2016-12-28 NOTE — Progress Notes (Signed)
RN spoke with patient's daughter.  Patient's daughter is at work.  Stated she is making arrangement for picking up her mother this evening for discharge.

## 2016-12-30 ENCOUNTER — Telehealth: Payer: Self-pay | Admitting: General Surgery

## 2016-12-30 NOTE — Telephone Encounter (Signed)
[facility]  Called by patient's daughter. Her incision has had some bleeding and now has some clear drainage. Shirt was wet from the drainage.  No fevers or chills.  Eating ok and having BM yesterday.     No redness around the wound and otherwise doing fair. Warned about concerns for infection. Likely small seroma or fluid pocket. Daughter will apply dressing and monitor.  Made appointment for 11/27 @ 11:15 for the daughter to get on patient and the wound.  Curlene Labrum, MD [facility] [address] [place], [place] [address] [phone] (office)

## 2017-01-01 ENCOUNTER — Telehealth: Payer: Self-pay | Admitting: General Surgery

## 2017-01-01 ENCOUNTER — Encounter (HOSPITAL_COMMUNITY): Payer: Self-pay | Admitting: Emergency Medicine

## 2017-01-01 ENCOUNTER — Emergency Department (HOSPITAL_COMMUNITY): Payer: Medicare Other

## 2017-01-01 ENCOUNTER — Emergency Department (HOSPITAL_COMMUNITY)
Admission: EM | Admit: 2017-01-01 | Discharge: 2017-01-01 | Disposition: A | Discharge status: Home / Self Care | Payer: Medicare Other | Source: Home / Self Care | Attending: Emergency Medicine | Admitting: Emergency Medicine

## 2017-01-01 DIAGNOSIS — Z5189 Encounter for other specified aftercare: Secondary | ICD-10-CM | POA: Insufficient documentation

## 2017-01-01 DIAGNOSIS — R109 Unspecified abdominal pain: Secondary | ICD-10-CM | POA: Diagnosis not present

## 2017-01-01 DIAGNOSIS — R600 Localized edema: Secondary | ICD-10-CM

## 2017-01-01 DIAGNOSIS — I10 Essential (primary) hypertension: Secondary | ICD-10-CM | POA: Insufficient documentation

## 2017-01-01 DIAGNOSIS — R1030 Lower abdominal pain, unspecified: Secondary | ICD-10-CM

## 2017-01-01 DIAGNOSIS — Z9049 Acquired absence of other specified parts of digestive tract: Secondary | ICD-10-CM | POA: Diagnosis not present

## 2017-01-01 DIAGNOSIS — F0390 Unspecified dementia without behavioral disturbance: Secondary | ICD-10-CM | POA: Insufficient documentation

## 2017-01-01 DIAGNOSIS — K439 Ventral hernia without obstruction or gangrene: Secondary | ICD-10-CM | POA: Diagnosis not present

## 2017-01-01 DIAGNOSIS — M6289 Other specified disorders of muscle: Secondary | ICD-10-CM | POA: Diagnosis not present

## 2017-01-01 DIAGNOSIS — Z79899 Other long term (current) drug therapy: Secondary | ICD-10-CM | POA: Insufficient documentation

## 2017-01-01 DIAGNOSIS — Z4800 Encounter for change or removal of nonsurgical wound dressing: Secondary | ICD-10-CM | POA: Diagnosis not present

## 2017-01-01 DIAGNOSIS — J9811 Atelectasis: Secondary | ICD-10-CM | POA: Diagnosis not present

## 2017-01-01 DIAGNOSIS — Z87891 Personal history of nicotine dependence: Secondary | ICD-10-CM

## 2017-01-01 DIAGNOSIS — T8132XA Disruption of internal operation (surgical) wound, not elsewhere classified, initial encounter: Secondary | ICD-10-CM | POA: Diagnosis not present

## 2017-01-01 DIAGNOSIS — Z681 Body mass index (BMI) 19 or less, adult: Secondary | ICD-10-CM | POA: Diagnosis not present

## 2017-01-01 LAB — CBC WITH DIFFERENTIAL/PLATELET
Basophils Absolute: 0 10*3/uL (ref 0.0–0.1)
Basophils Relative: 0 %
EOS PCT: 0 %
Eosinophils Absolute: 0 10*3/uL (ref 0.0–0.7)
HCT: 42 % (ref 36.0–46.0)
HEMOGLOBIN: 13.8 g/dL (ref 12.0–15.0)
LYMPHS ABS: 1.1 10*3/uL (ref 0.7–4.0)
LYMPHS PCT: 11 %
MCH: 28.9 pg (ref 26.0–34.0)
MCHC: 32.9 g/dL (ref 30.0–36.0)
MCV: 87.9 fL (ref 78.0–100.0)
Monocytes Absolute: 0.7 10*3/uL (ref 0.1–1.0)
Monocytes Relative: 7 %
NEUTROS ABS: 8.2 10*3/uL — AB (ref 1.7–7.7)
Neutrophils Relative %: 82 %
Platelets: 277 10*3/uL (ref 150–400)
RBC: 4.78 MIL/uL (ref 3.87–5.11)
RDW: 13.5 % (ref 11.5–15.5)
WBC: 10 10*3/uL (ref 4.0–10.5)

## 2017-01-01 LAB — COMPREHENSIVE METABOLIC PANEL
ALK PHOS: 56 U/L (ref 38–126)
ALT: 14 U/L (ref 14–54)
AST: 40 U/L (ref 15–41)
Albumin: 3.1 g/dL — ABNORMAL LOW (ref 3.5–5.0)
Anion gap: 10 (ref 5–15)
BILIRUBIN TOTAL: 0.5 mg/dL (ref 0.3–1.2)
BUN: 9 mg/dL (ref 6–20)
CALCIUM: 8.3 mg/dL — AB (ref 8.9–10.3)
CO2: 28 mmol/L (ref 22–32)
CREATININE: 0.54 mg/dL (ref 0.44–1.00)
Chloride: 98 mmol/L — ABNORMAL LOW (ref 101–111)
Glucose, Bld: 124 mg/dL — ABNORMAL HIGH (ref 65–99)
Potassium: 3.4 mmol/L — ABNORMAL LOW (ref 3.5–5.1)
Sodium: 136 mmol/L (ref 135–145)
Total Protein: 6.4 g/dL — ABNORMAL LOW (ref 6.5–8.1)

## 2017-01-01 MED ORDER — IOPAMIDOL (ISOVUE-300) INJECTION 61%
75.0000 mL | Freq: Once | INTRAVENOUS | Status: AC | PRN
  Administered 2017-01-01: 75 mL via INTRAVENOUS

## 2017-01-01 MED ORDER — IOPAMIDOL (ISOVUE-300) INJECTION 61%
INTRAVENOUS | Status: AC
  Filled 2017-01-01: qty 30

## 2017-01-01 NOTE — Care Management (Signed)
CM contacted by surgeon, Dr. Constance Haw, about getting RN services added to pt's [facility]. CM reviewed pt chart. Contacted HH rep, Clarise Cruz, to discuss process for this. MD to give [facility] agency verbal order for RN.

## 2017-01-01 NOTE — Telephone Encounter (Signed)
[facility]  She lives at home and she has [place] home health.  Spoke with Oneita Kras regarding this, and the wound which continues to have some drainage. The RN that is speaking with me and is going to see if someone can go out today. PT is the person that reported this to her.    [name], that her mom's wound is draining significantly and turning red.  She is eating and having BMs. The patient is continuing to mess with the wound.  The patient has dementia and has significantly difficult to take out of the house. The daughter would like for someone to look at the wound if possible at the house, and understands she might still need to go to the ED.   Given this concerned possibly an infection but also could have a dehiscence of the fascia given the drainage.  Staples are intact and there is no reports of any skin dehiscence.   Mariann Laster is going to see if the home health RN can come by today and I told [name] if she is not able to, she may need to take the patient to the ED for evaluation.  If the RN comes out, they may want her to go to the ED. [name] understands this possibility.   Patient appt with me is 11/27 @ 11:15.  If she goes to the ED prior will have the ED physician assess the wound and let us know any concerns.   Updated Dr. Arnoldo Morale who is on call this weekend with the issues with the patient and possibility of her going to the ED.   Curlene Labrum, MD [facility] [address] [place], [place] [address] [phone] (office)

## 2017-01-01 NOTE — ED Provider Notes (Signed)
[facility] EMERGENCY DEPARTMENT Provider Note   CSN: [id number] Arrival date & time: 01/01/17  1521     History   Chief Complaint Chief Complaint  Patient presents with  . Wound Check    HPI Brittney Wall is a 71 y.o. female.  HPI Patient has history of dementia.  Level 5 caveat applies.  Patient recently had lap for small bowel obstruction and bowel ischemia.  Per daughter she has had significant drainage from the wound for last 2 days.  States the drainage is clear.  Evaluated by wound care nurse today and advised to come to the emergency department for evaluation.  Per daughter patient had no vomiting or diarrhea.  She is at her baseline mental status. Past Medical History:  Diagnosis Date  . Bipolar 1 disorder ([place])   . Dementia   . Hypertension   . Left adrenal mass ([place]) 09/06/2016  . Pneumothorax on right 09/07/2016  . Schizophrenia [facility])     Patient Active Problem List   Diagnosis Date Noted  . Small bowel obstruction ([place]) 12/23/2016  . Hypokalemia 12/23/2016  . Obturator hernia   . Ischemic bowel disease ([place])   . Pneumothorax on right 09/07/2016  . Left adrenal mass ([place]) 09/06/2016  . Abnormal CT scan 09/06/2016  . Compression fracture of L5 lumbar vertebra ([place]) 09/06/2016  . Malignant hypertension 09/06/2016  . Ileus ([place]) 09/05/2016  . Chest pain 03/15/2015  . Hypertensive urgency 03/15/2015  . Schizophrenia ([place]) 03/15/2015  . Tobacco abuse 03/15/2015  . Dementia with behavioral disturbance 03/15/2015  . Protein-calorie malnutrition, severe ([place]) 03/15/2015  . Chest pain at rest 03/15/2015    Past Surgical History:  Procedure Laterality Date  . BOWEL RESECTION  12/23/2016   Procedure: SMALL BOWEL RESECTION;  Surgeon: Virl Cagey, MD;  Location: [facility] ORS;  Service: General;;  . LAPAROTOMY N/A 12/23/2016   Procedure: EXPLORATORY LAPAROTOMY;  Surgeon: Virl Cagey, MD;  Location: [facility] ORS;  Service: General;  Laterality: N/A;  . ortho  procedure      OB History    No data available       Home Medications    Prior to Admission medications   Medication Sig Start Date End Date Taking? Authorizing Provider  benzonatate (TESSALON) 100 MG capsule Take 100 mg every 8 (eight) hours as needed by mouth for cough.   Yes [provider]  cloNIDine (CATAPRES) 0.1 MG tablet Take 0.1 mg by mouth 3 (three) times daily.     Yes [provider]  haloperidol (HALDOL) 5 MG tablet Take 5 mg by mouth at bedtime.  02/16/15  Yes [provider]  hydrALAZINE (APRESOLINE) 10 MG tablet Take 2 tablets (20 mg total) by mouth at bedtime. Patient taking differently: Take 10 mg by mouth at bedtime.  09/07/16  Yes Rexene Alberts, MD  LORazepam (ATIVAN) 0.5 MG tablet Take 0.5 mg by mouth at bedtime as needed. 07/19/16  Yes [provider]  megestrol (MEGACE) 40 MG/ML suspension Take 800 mg by mouth daily. For appetite   Yes [provider]  minoxidil (LONITEN) 2.5 MG tablet Take 2 tablets (5 mg total) by mouth 2 (two) times daily. 03/17/15  Yes Donne Hazel, MD  mirtazapine (REMERON) 30 MG tablet Take 30 mg by mouth at bedtime.     Yes [provider]  oxyCODONE (OXY IR/ROXICODONE) 5 MG immediate release tablet Take 1 tablet (5 mg total) every 6 (six) hours as needed by mouth for severe pain.  12/28/16  Yes Johnson, Clanford L, MD  pravastatin (PRAVACHOL) 20 MG tablet Take 20 mg by mouth at bedtime.    Yes [provider]  traZODone (DESYREL) 100 MG tablet Take 200 mg by mouth at bedtime as needed for sleep.   Yes [provider]    Family History No family history on file.  Social History Social History   Tobacco Use  . Smoking status: Former Smoker    Packs/day: 0.50    Types: Cigarettes  . Smokeless tobacco: Never Used  Substance Use Topics  . Alcohol use: No  . Drug use: No     Allergies   Patient has no known allergies.   Review of Systems Review of Systems    Unable to perform ROS: Dementia     Physical Exam Updated Vital Signs BP (!) 180/88 (BP Location: Right Arm)   Pulse 88   Temp 98.7 F (37.1 C) (Oral)   Resp 18   Ht 5\' 6"  (1.676 m)   Wt 43.1 kg (95 lb)   SpO2 95%   BMI 15.33 kg/m   Physical Exam  Constitutional: She appears well-developed and well-nourished.  HENT:  Head: Normocephalic and atraumatic.  Mouth/Throat: Oropharynx is clear and moist.  Eyes: EOM are normal. Pupils are equal, round, and reactive to light.  Neck: Normal range of motion. Neck supple.  Cardiovascular: Normal rate and regular rhythm. Exam reveals no gallop and no friction rub.  No murmur heard. Pulmonary/Chest: Effort normal and breath sounds normal.  Abdominal: Soft. Bowel sounds are normal. She exhibits mass. There is no tenderness. There is no rebound and no guarding.  Midline lower abdominal surgical wound with staples intact.  There is mild erythema around the wound edges at the superior portion.  There is an underlying palpable mass without induration or warmth.  No drainage from the site.  Abdomen appears nontender to palpation.  Musculoskeletal: Normal range of motion. She exhibits no edema or tenderness.  Neurological: She is alert.  Moving all extremities without deficit.  Confused and combative. Not following commands.  Skin: Skin is warm and dry. Capillary refill takes less than 2 seconds. No rash noted. No erythema.  Psychiatric: She has a normal mood and affect. Her behavior is normal.  Nursing note and vitals reviewed.    ED Treatments / Results  Labs (all labs ordered are listed, but only abnormal results are displayed) Labs Reviewed  CBC WITH DIFFERENTIAL/PLATELET - Abnormal; Notable for the following components:      Result Value   Neutro Abs 8.2 (*)    All other components within normal limits  COMPREHENSIVE METABOLIC PANEL - Abnormal; Notable for the following components:   Potassium 3.4 (*)    Chloride 98 (*)    Glucose,  Bld 124 (*)    Calcium 8.3 (*)    Total Protein 6.4 (*)    Albumin 3.1 (*)    All other components within normal limits    EKG  EKG Interpretation None       Radiology Ct Abdomen Pelvis W Contrast  Result Date: 01/01/2017 CLINICAL DATA:  Abdominal pain and purulent drainage from surgical wound. Approximately 1 week postop from small bowel resection for hernia with bowel obstruction. EXAM: CT ABDOMEN AND PELVIS WITH CONTRAST TECHNIQUE: Multidetector CT imaging of the abdomen and pelvis was performed using the standard protocol following bolus administration of intravenous contrast. CONTRAST:  14mL ISOVUE-300 IOPAMIDOL (ISOVUE-300) INJECTION 61% COMPARISON:  12/23/2016 FINDINGS: Lower Chest: New small to  moderate bilateral pleural effusions and bibasilar atelectasis, left side greater than right. Hepatobiliary: No hepatic masses identified. Gallbladder is unremarkable. Pancreas:  No mass or inflammatory changes. Spleen: Within normal limits in size and appearance. Adrenals/Urinary Tract: No significant change in 1.4 cm left adrenal mass since earlier CT in 2015, consistent with benign adrenal adenoma. Several tiny left renal cysts are stable. No evidence of renal mass or hydronephrosis. Unremarkable unopacified urinary bladder. Stomach/Bowel: No evidence of bowel obstruction or bowel wall thickening. A small midline ventral abdominal wall hernia is seen at site of surgical staples which contains a small bowel loop. No significant bowel wall thickening or dilatation. Vascular/Lymphatic: No pathologically enlarged lymph nodes. No abdominal aortic aneurysm. Aortic atherosclerosis. Reproductive: Stable small uterine fibroids, largest measuring approximately 3 cm. Other: New mild ascites and diffuse mesenteric and body wall edema. No evidence of abscess or free intraperitoneal air. Musculoskeletal: No suspicious bone lesions identified. Stable old L5 vertebral body compression fracture deformity.  IMPRESSION: New bilateral pleural effusions, mild ascites, and diffuse mesenteric and body wall edema. No evidence of abscess or focal inflammatory process. Small midline incisional ventral abdominal wall hernia containing a small bowel loop. No definite evidence of bowel obstruction or ischemia. Stable small uterine fibroids. Electronically Signed   By: Earle Gell M.D.   On: 01/01/2017 21:44    Procedures Procedures (including critical care time)  Medications Ordered in ED Medications  iopamidol (ISOVUE-300) 61 % injection (not administered)  iopamidol (ISOVUE-300) 61 % injection 75 mL (75 mLs Intravenous Contrast Given 01/01/17 2057)     Initial Impression / Assessment and Plan / ED Course  I have reviewed the triage vital signs and the nursing notes.  Pertinent labs & imaging results that were available during my care of the patient were reviewed by me and considered in my medical decision making (see chart for details).     Ct without evidence of abscess. Normal WBC. Will need to f/u with Dr Constance Haw. CT does show increased fluid throughout. Advised to f/u with PMD. Return precautions given.   Final Clinical Impressions(s) / ED Diagnoses   Final diagnoses:  Visit for wound check    ED Discharge Orders    None       Julianne Rice, MD 01/01/17 2209

## 2017-01-01 NOTE — ED Triage Notes (Signed)
Patient presents with daughter for wound check. Patient has had surgery 2 weeks ago for hernia and bowel blockage. Home health nurse states there was a lot of drainage and purulence.  Patient has history bipolar and schizophrenia.

## 2017-01-01 NOTE — ED Notes (Addendum)
Pt too combative and will not stay still for bp. Have attempted several times. Pt changed, urine noted to depends

## 2017-01-03 ENCOUNTER — Inpatient Hospital Stay (HOSPITAL_COMMUNITY)
Admission: EM | Admit: 2017-01-03 | Discharge: 2017-01-07 | DRG: 908 | Disposition: A | Discharge status: Skilled Nursing Facility | Payer: Medicare Other | Attending: General Surgery | Admitting: General Surgery

## 2017-01-03 ENCOUNTER — Emergency Department (HOSPITAL_COMMUNITY): Payer: Medicare Other

## 2017-01-03 ENCOUNTER — Encounter (HOSPITAL_COMMUNITY): Payer: Self-pay | Admitting: Emergency Medicine

## 2017-01-03 DIAGNOSIS — F319 Bipolar disorder, unspecified: Secondary | ICD-10-CM | POA: Diagnosis present

## 2017-01-03 DIAGNOSIS — Z681 Body mass index (BMI) 19 or less, adult: Secondary | ICD-10-CM

## 2017-01-03 DIAGNOSIS — M6289 Other specified disorders of muscle: Secondary | ICD-10-CM | POA: Diagnosis not present

## 2017-01-03 DIAGNOSIS — Z9049 Acquired absence of other specified parts of digestive tract: Secondary | ICD-10-CM

## 2017-01-03 DIAGNOSIS — J9811 Atelectasis: Secondary | ICD-10-CM | POA: Diagnosis not present

## 2017-01-03 DIAGNOSIS — T8130XA Disruption of wound, unspecified, initial encounter: Secondary | ICD-10-CM | POA: Diagnosis present

## 2017-01-03 DIAGNOSIS — T8132XA Disruption of internal operation (surgical) wound, not elsewhere classified, initial encounter: Secondary | ICD-10-CM | POA: Diagnosis not present

## 2017-01-03 DIAGNOSIS — R636 Underweight: Secondary | ICD-10-CM | POA: Diagnosis present

## 2017-01-03 DIAGNOSIS — F209 Schizophrenia, unspecified: Secondary | ICD-10-CM | POA: Diagnosis present

## 2017-01-03 DIAGNOSIS — I10 Essential (primary) hypertension: Secondary | ICD-10-CM | POA: Diagnosis present

## 2017-01-03 DIAGNOSIS — R52 Pain, unspecified: Secondary | ICD-10-CM

## 2017-01-03 DIAGNOSIS — E876 Hypokalemia: Secondary | ICD-10-CM | POA: Diagnosis present

## 2017-01-03 DIAGNOSIS — K439 Ventral hernia without obstruction or gangrene: Secondary | ICD-10-CM | POA: Diagnosis present

## 2017-01-03 DIAGNOSIS — M629 Disorder of muscle, unspecified: Secondary | ICD-10-CM

## 2017-01-03 DIAGNOSIS — F0390 Unspecified dementia without behavioral disturbance: Secondary | ICD-10-CM | POA: Diagnosis present

## 2017-01-03 DIAGNOSIS — Y836 Removal of other organ (partial) (total) as the cause of abnormal reaction of the patient, or of later complication, without mention of misadventure at the time of the procedure: Secondary | ICD-10-CM | POA: Diagnosis present

## 2017-01-03 DIAGNOSIS — Z87891 Personal history of nicotine dependence: Secondary | ICD-10-CM

## 2017-01-03 LAB — BASIC METABOLIC PANEL
Anion gap: 9 (ref 5–15)
BUN: 6 mg/dL (ref 6–20)
CO2: 30 mmol/L (ref 22–32)
CREATININE: 0.57 mg/dL (ref 0.44–1.00)
Calcium: 8.5 mg/dL — ABNORMAL LOW (ref 8.9–10.3)
Chloride: 103 mmol/L (ref 101–111)
GFR calc Af Amer: >60 mL/min (ref >60)
GLUCOSE: 77 mg/dL (ref 65–99)
Potassium: 3 mmol/L — ABNORMAL LOW (ref 3.5–5.1)
SODIUM: 142 mmol/L (ref 135–145)

## 2017-01-03 LAB — CBC WITH DIFFERENTIAL/PLATELET
BASOS ABS: 0 10*3/uL (ref 0.0–0.1)
Basophils Relative: 0 %
EOS ABS: 0 10*3/uL (ref 0.0–0.7)
EOS PCT: 0 %
HCT: 37.2 % (ref 36.0–46.0)
Hemoglobin: 12.2 g/dL (ref 12.0–15.0)
Lymphocytes Relative: 7 %
Lymphs Abs: 1 10*3/uL (ref 0.7–4.0)
MCH: 29.4 pg (ref 26.0–34.0)
MCHC: 32.8 g/dL (ref 30.0–36.0)
MCV: 89.6 fL (ref 78.0–100.0)
MONO ABS: 0.7 10*3/uL (ref 0.1–1.0)
Monocytes Relative: 5 %
Neutro Abs: 11.6 10*3/uL — ABNORMAL HIGH (ref 1.7–7.7)
Neutrophils Relative %: 88 %
PLATELETS: 342 10*3/uL (ref 150–400)
RBC: 4.15 MIL/uL (ref 3.87–5.11)
RDW: 14 % (ref 11.5–15.5)
WBC: 13.3 10*3/uL — AB (ref 4.0–10.5)

## 2017-01-03 MED ORDER — CLONIDINE HCL 0.1 MG PO TABS
0.1000 mg | ORAL_TABLET | Freq: Three times a day (TID) | ORAL | Status: DC
  Administered 2017-01-03 – 2017-01-07 (×11): 0.1 mg via ORAL
  Filled 2017-01-03 (×11): qty 1

## 2017-01-03 MED ORDER — TRAZODONE HCL 50 MG PO TABS
200.0000 mg | ORAL_TABLET | Freq: Every evening | ORAL | Status: DC | PRN
  Administered 2017-01-03 – 2017-01-05 (×2): 200 mg via ORAL
  Filled 2017-01-03 (×3): qty 4

## 2017-01-03 MED ORDER — LORAZEPAM 2 MG/ML IJ SOLN: 1.0000 mg | Freq: Once | INTRAMUSCULAR | Status: DC

## 2017-01-03 MED ORDER — ONDANSETRON HCL 4 MG/2ML IJ SOLN: 4.0000 mg | Freq: Four times a day (QID) | INTRAMUSCULAR | Status: DC | PRN

## 2017-01-03 MED ORDER — MEGESTROL ACETATE 400 MG/10ML PO SUSP
800.0000 mg | Freq: Every day | ORAL | Status: DC
  Administered 2017-01-04 – 2017-01-07 (×4): 800 mg via ORAL
  Filled 2017-01-03 (×7): qty 20

## 2017-01-03 MED ORDER — ACETAMINOPHEN 325 MG PO TABS: 650.0000 mg | ORAL_TABLET | Freq: Four times a day (QID) | ORAL | Status: DC | PRN

## 2017-01-03 MED ORDER — LORAZEPAM 1 MG PO TABS
1.0000 mg | ORAL_TABLET | Freq: Once | ORAL | Status: AC
  Administered 2017-01-03: 1 mg via ORAL
  Filled 2017-01-03: qty 1

## 2017-01-03 MED ORDER — ACETAMINOPHEN 650 MG RE SUPP: 650.0000 mg | Freq: Four times a day (QID) | RECTAL | Status: DC | PRN

## 2017-01-03 MED ORDER — HYDRALAZINE HCL 10 MG PO TABS
10.0000 mg | ORAL_TABLET | Freq: Every day | ORAL | Status: DC
  Administered 2017-01-03 – 2017-01-06 (×4): 10 mg via ORAL
  Filled 2017-01-03 (×4): qty 1

## 2017-01-03 MED ORDER — HALOPERIDOL LACTATE 5 MG/ML IJ SOLN
2.0000 mg | Freq: Four times a day (QID) | INTRAMUSCULAR | Status: DC | PRN
  Administered 2017-01-03 – 2017-01-04 (×2): 2 mg via INTRAMUSCULAR
  Filled 2017-01-03 (×2): qty 1

## 2017-01-03 MED ORDER — ENOXAPARIN SODIUM 30 MG/0.3ML ~~LOC~~ SOLN
30.0000 mg | SUBCUTANEOUS | Status: DC
  Administered 2017-01-03 – 2017-01-06 (×3): 30 mg via SUBCUTANEOUS
  Filled 2017-01-03 (×4): qty 0.3

## 2017-01-03 MED ORDER — POTASSIUM CHLORIDE 10 MEQ/100ML IV SOLN
10.0000 meq | INTRAVENOUS | Status: AC
  Administered 2017-01-03 – 2017-01-04 (×3): 10 meq via INTRAVENOUS
  Filled 2017-01-03 (×3): qty 100

## 2017-01-03 MED ORDER — LORAZEPAM 0.5 MG PO TABS
0.5000 mg | ORAL_TABLET | Freq: Four times a day (QID) | ORAL | Status: DC | PRN
  Administered 2017-01-04 – 2017-01-07 (×4): 0.5 mg via ORAL
  Filled 2017-01-03 (×5): qty 1

## 2017-01-03 MED ORDER — KCL IN DEXTROSE-NACL 40-5-0.45 MEQ/L-%-% IV SOLN
INTRAVENOUS | Status: DC
  Administered 2017-01-03: 20:00:00 via INTRAVENOUS

## 2017-01-03 MED ORDER — ONDANSETRON 4 MG PO TBDP: 4.0000 mg | ORAL_TABLET | Freq: Four times a day (QID) | ORAL | Status: DC | PRN

## 2017-01-03 MED ORDER — FENTANYL CITRATE (PF) 100 MCG/2ML IJ SOLN
25.0000 ug | INTRAMUSCULAR | Status: DC | PRN
  Administered 2017-01-04 – 2017-01-05 (×6): 25 ug via INTRAVENOUS
  Filled 2017-01-03 (×6): qty 2

## 2017-01-03 MED ORDER — LIDOCAINE-EPINEPHRINE (PF) 2 %-1:200000 IJ SOLN
10.0000 mL | Freq: Once | INTRAMUSCULAR | Status: AC
  Administered 2017-01-03: 10 mL
  Filled 2017-01-03: qty 20

## 2017-01-03 MED ORDER — HALOPERIDOL 5 MG PO TABS
5.0000 mg | ORAL_TABLET | Freq: Every day | ORAL | Status: DC
  Administered 2017-01-03 – 2017-01-06 (×4): 5 mg via ORAL
  Filled 2017-01-03 (×7): qty 1

## 2017-01-03 MED ORDER — SIMETHICONE 80 MG PO CHEW: 40.0000 mg | CHEWABLE_TABLET | Freq: Four times a day (QID) | ORAL | Status: DC | PRN

## 2017-01-03 MED ORDER — CEFAZOLIN SODIUM-DEXTROSE 1-4 GM/50ML-% IV SOLN
1.0000 g | Freq: Three times a day (TID) | INTRAVENOUS | Status: DC
  Administered 2017-01-03 – 2017-01-04 (×2): 1 g via INTRAVENOUS
  Filled 2017-01-03 (×11): qty 50

## 2017-01-03 MED ORDER — SODIUM CHLORIDE 0.9 % IV BOLUS (SEPSIS)
1000.0000 mL | Freq: Once | INTRAVENOUS | Status: AC
  Administered 2017-01-03: 1000 mL via INTRAVENOUS

## 2017-01-03 MED ORDER — MIRTAZAPINE 30 MG PO TABS
ORAL_TABLET | ORAL | Status: AC
  Filled 2017-01-03: qty 1

## 2017-01-03 MED ORDER — MIRTAZAPINE 30 MG PO TABS
30.0000 mg | ORAL_TABLET | Freq: Every day | ORAL | Status: DC
  Administered 2017-01-03: 30 mg via ORAL
  Filled 2017-01-03 (×3): qty 1

## 2017-01-03 NOTE — ED Notes (Signed)
Family reluctant to take pt home due in their inability to give her night time meds  Dr Lemmie Evens ordered meds
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
## 2017-01-03 NOTE — ED Notes (Signed)
Per caregiver. Pt last ate at 1000 eating ham, peanut butter sandwich

## 2017-01-03 NOTE — ED Notes (Signed)
Report to Amy, RN

## 2017-01-03 NOTE — ED Notes (Signed)
To radiology

## 2017-01-03 NOTE — ED Notes (Signed)
Dr Arnoldo Morale in to assess

## 2017-01-03 NOTE — H&P (Signed)
Brittney Wall is an 71 y.o. female.   Chief Complaint: Wound breakdown HPI: Patient is a 70 year old black female with dementia and bipolar disorder who is status post partial small bowel resection for an incarcerated obturator hernia on 12/23/2016 who has been picking her wound at home and now has some soft tissue sticking out of the upper portion of the incision.  She was seen 2 days ago in the emergency room and was noted to have a fascial dehiscence along the superior aspect of the surgical incision.  Family reports that she has had no fevers and has been eating fine.  No nausea or vomiting have been noted.  She presented back to the emergency room with a new finding on her incision.  She is being admitted to the hospital for IV antibiotics and subsequent closure of the wound.  Past Medical History:  Diagnosis Date  . Bipolar 1 disorder ([place])   . Dementia   . Hypertension   . Left adrenal mass ([place]) 09/06/2016  . Pneumothorax on right 09/07/2016  . Schizophrenia [facility])     Past Surgical History:  Procedure Laterality Date  . BOWEL RESECTION  12/23/2016   Procedure: SMALL BOWEL RESECTION;  Surgeon: Virl Cagey, MD;  Location: [facility]S;  Service: General;;  . LAPAROTOMY N/A 12/23/2016   Procedure: EXPLORATORY LAPAROTOMY;  Surgeon: Virl Cagey, MD;  Location: [facility] ORS;  Service: General;  Laterality: N/A;  . ortho procedure      History reviewed. No pertinent family history. Social History:  reports that she has quit smoking. Her smoking use included cigarettes. She smoked 0.50 packs per day. she has never used smokeless tobacco. She reports that she does not drink alcohol or use drugs.  Allergies: No Known Allergies   (Not in a hospital admission)  Results for orders placed or performed during the hospital encounter of 01/03/17 (from the past 48 hour(s))  Basic metabolic panel     Status: Abnormal   Collection Time: 01/03/17  3:15 PM  Result Value Ref Range   Sodium 142  135 - 145 mmol/L   Potassium 3.0 (L) 3.5 - 5.1 mmol/L   Chloride 103 101 - 111 mmol/L   CO2 30 22 - 32 mmol/L   Glucose, Bld 77 65 - 99 mg/dL   BUN 6 6 - 20 mg/dL   Creatinine, Ser 0.57 0.44 - 1.00 mg/dL   Calcium 8.5 (L) 8.9 - 10.3 mg/dL   GFR calc non Af Amer >60 >60 mL/min   GFR calc Af Amer >60 >60 mL/min    Comment: (NOTE) The eGFR has been calculated using the CKD EPI equation. This calculation has not been validated in all clinical situations. eGFR's persistently <60 mL/min signify possible Chronic Kidney Disease.    Anion gap 9 5 - 15  CBC with Differential     Status: Abnormal   Collection Time: 01/03/17  3:15 PM  Result Value Ref Range   WBC 13.3 (H) 4.0 - 10.5 K/uL   RBC 4.15 3.87 - 5.11 MIL/uL   Hemoglobin 12.2 12.0 - 15.0 g/dL   HCT 37.2 36.0 - 46.0 %   MCV 89.6 78.0 - 100.0 fL   MCH 29.4 26.0 - 34.0 pg   MCHC 32.8 30.0 - 36.0 g/dL   RDW 14.0 11.5 - 15.5 %   Platelets 342 150 - 400 K/uL   Neutrophils Relative % 88 %   Neutro Abs 11.6 (H) 1.7 - 7.7 K/uL   Lymphocytes Relative 7 %  Lymphs Abs 1.0 0.7 - 4.0 K/uL   Monocytes Relative 5 %   Monocytes Absolute 0.7 0.1 - 1.0 K/uL   Eosinophils Relative 0 %   Eosinophils Absolute 0.0 0.0 - 0.7 K/uL   Basophils Relative 0 %   Basophils Absolute 0.0 0.0 - 0.1 K/uL   Ct Abdomen Pelvis W Contrast  Result Date: 01/01/2017 CLINICAL DATA:  Abdominal pain and purulent drainage from surgical wound. Approximately 1 week postop from small bowel resection for hernia with bowel obstruction. EXAM: CT ABDOMEN AND PELVIS WITH CONTRAST TECHNIQUE: Multidetector CT imaging of the abdomen and pelvis was performed using the standard protocol following bolus administration of intravenous contrast. CONTRAST:  61m ISOVUE-300 IOPAMIDOL (ISOVUE-300) INJECTION 61% COMPARISON:  12/23/2016 FINDINGS: Lower Chest: New small to moderate bilateral pleural effusions and bibasilar atelectasis, left side greater than right. Hepatobiliary: No hepatic  masses identified. Gallbladder is unremarkable. Pancreas:  No mass or inflammatory changes. Spleen: Within normal limits in size and appearance. Adrenals/Urinary Tract: No significant change in 1.4 cm left adrenal mass since earlier CT in 2015, consistent with benign adrenal adenoma. Several tiny left renal cysts are stable. No evidence of renal mass or hydronephrosis. Unremarkable unopacified urinary bladder. Stomach/Bowel: No evidence of bowel obstruction or bowel wall thickening. A small midline ventral abdominal wall hernia is seen at site of surgical staples which contains a small bowel loop. No significant bowel wall thickening or dilatation. Vascular/Lymphatic: No pathologically enlarged lymph nodes. No abdominal aortic aneurysm. Aortic atherosclerosis. Reproductive: Stable small uterine fibroids, largest measuring approximately 3 cm. Other: New mild ascites and diffuse mesenteric and body wall edema. No evidence of abscess or free intraperitoneal air. Musculoskeletal: No suspicious bone lesions identified. Stable old L5 vertebral body compression fracture deformity. IMPRESSION: New bilateral pleural effusions, mild ascites, and diffuse mesenteric and body wall edema. No evidence of abscess or focal inflammatory process. Small midline incisional ventral abdominal wall hernia containing a small bowel loop. No definite evidence of bowel obstruction or ischemia. Stable small uterine fibroids. Electronically Signed   By: JEarle GellM.D.   On: 01/01/2017 21:44   Dg Abd Acute W/chest  Result Date: 01/03/2017 CLINICAL DATA:  Pain. EXAM: DG ABDOMEN ACUTE W/ 1V CHEST COMPARISON:  Radiograph of December 23, 2016. FINDINGS: Mild cardiomegaly is noted. Atherosclerosis of thoracic aorta is noted. Mild bibasilar subsegmental atelectasis is noted with probable minimal bilateral pleural effusions. There is no abnormal bowel dilatation. Midline surgical staples are noted. Vascular calcifications are noted. IMPRESSION:  No evidence of bowel obstruction or ileus. Mild bibasilar subsegmental atelectasis is noted with probable minimal bilateral pleural effusions. Electronically Signed   By: JMarijo Conception M.D.   On: 01/03/2017 16:22    Review of Systems  Unable to perform ROS: Dementia    Blood pressure 136/62, pulse 79, temperature 98.2 F (36.8 C), temperature source Oral, resp. rate 18, height _0  (1.676 m), weight 95 lb (43.1 kg). Physical Exam  Vitals reviewed. Constitutional: She appears well-developed and well-nourished.  HENT:  Head: Normocephalic and atraumatic.  Cardiovascular: Normal rate, regular rhythm and normal heart sounds. Exam reveals no gallop and no friction rub.  No murmur heard. Respiratory: Effort normal and breath sounds normal. No respiratory distress. She has no wheezes. She has no rales.  GI: Soft. Bowel sounds are normal. She exhibits no distension. There is no tenderness. There is no rebound.  Midline incision with staples intact.  A small area of what appears to be possible omentum is sticking up on  the upper portion of the incision.  No purulent drainage is present.  No bowel was exposed.  Skin: Skin is warm and dry.    CT scan images from 2 days ago personally reviewed Assessment/Plan Impression: Wound dehiscence, status post exploratory laparotomy.  No evidence of perforated bowel or anastomotic leak. Plan: We will bring patient into the hospital start IV antibiotics and treat her hypokalemia.  She subsequently will undergo wound closure.  The risks and benefits of the procedure were explained to the patient's family, who gave informed consent for the patient as the patient is demented.  Aviva Signs, MD 01/03/2017, 6:26 PM

## 2017-01-03 NOTE — ED Provider Notes (Addendum)
[facility] EMERGENCY DEPARTMENT Provider Note   CSN: [id number] Arrival date & time: 01/03/17  1216     History   Chief Complaint Chief Complaint  Patient presents with  . Post-op Problem    HPI Brittney Wall is a 71 y.o. female.  Pt presents to the ED today with tissue hanging out of her wound.  She was admitted from 11/12-11/17 with a small bowel ischemia due to obturator hernia.  The pt had resection of the small bowel by Dr. Constance Haw as well due to ischemia of the small bowel.  The pt has had a lot of drainage from the wound and returned to the ED on 11/21.  The pt had a CT scan at that time which showed a small midline incisional ventral abdominal wall hernia containing a small bowel loop.  No abscess.  The pt was sent home.  Her sister has been caring for her and said she picked open her wound and when she checked her abdomen, there was tissue and blood hanging out.  Pt has dementia and is unable to give any hx.       Past Medical History:  Diagnosis Date  . Bipolar 1 disorder ([employer])   . Dementia   . Hypertension   . Left adrenal mass ([place]) 09/06/2016  . Pneumothorax on right 09/07/2016  . Schizophrenia [facility])     Patient Active Problem List   Diagnosis Date Noted  . Small bowel obstruction ([place]) 12/23/2016  . Hypokalemia 12/23/2016  . Obturator hernia   . Ischemic bowel disease ([place])   . Pneumothorax on right 09/07/2016  . Left adrenal mass ([place]) 09/06/2016  . Abnormal CT scan 09/06/2016  . Compression fracture of L5 lumbar vertebra ([place]) 09/06/2016  . Malignant hypertension 09/06/2016  . Ileus ([place]) 09/05/2016  . Chest pain 03/15/2015  . Hypertensive urgency 03/15/2015  . Schizophrenia ([place]) 03/15/2015  . Tobacco abuse 03/15/2015  . Dementia with behavioral disturbance 03/15/2015  . Protein-calorie malnutrition, severe ([place]) 03/15/2015  . Chest pain at rest 03/15/2015    Past Surgical History:  Procedure Laterality Date  . BOWEL RESECTION  12/23/2016    Procedure: SMALL BOWEL RESECTION;  Surgeon: Virl Cagey, MD;  Location: [facility] ORS;  Service: General;;  . LAPAROTOMY N/A 12/23/2016   Procedure: EXPLORATORY LAPAROTOMY;  Surgeon: Virl Cagey, MD;  Location: [facility] ORS;  Service: General;  Laterality: N/A;  . ortho procedure      OB History    No data available       Home Medications    Prior to Admission medications   Medication Sig Start Date End Date Taking? Authorizing Provider  benzonatate (TESSALON) 100 MG capsule Take 100 mg every 8 (eight) hours as needed by mouth for cough.    [provider]  cloNIDine (CATAPRES) 0.1 MG tablet Take 0.1 mg by mouth 3 (three) times daily.      [provider]  haloperidol (HALDOL) 5 MG tablet Take 5 mg by mouth at bedtime.  02/16/15   [provider]  hydrALAZINE (APRESOLINE) 10 MG tablet Take 2 tablets (20 mg total) by mouth at bedtime. Patient taking differently: Take 10 mg by mouth at bedtime.  09/07/16   Rexene Alberts, MD  LORazepam (ATIVAN) 0.5 MG tablet Take 0.5 mg by mouth at bedtime as needed. 07/19/16   [provider]  megestrol (MEGACE) 40 MG/ML suspension Take 800 mg by mouth daily. For appetite    [provider]  minoxidil (LONITEN)  2.5 MG tablet Take 2 tablets (5 mg total) by mouth 2 (two) times daily. 03/17/15   Donne Hazel, MD  mirtazapine (REMERON) 30 MG tablet Take 30 mg by mouth at bedtime.      [provider]  oxyCODONE (OXY IR/ROXICODONE) 5 MG immediate release tablet Take 1 tablet (5 mg total) every 6 (six) hours as needed by mouth for severe pain. 12/28/16   Johnson, Clanford L, MD  pravastatin (PRAVACHOL) 20 MG tablet Take 20 mg by mouth at bedtime.     [provider]  traZODone (DESYREL) 100 MG tablet Take 200 mg by mouth at bedtime as needed for sleep.    [provider]    Family History History reviewed. No pertinent family history.  Social History Social History   Tobacco Use  .  Smoking status: Former Smoker    Packs/day: 0.50    Types: Cigarettes  . Smokeless tobacco: Never Used  Substance Use Topics  . Alcohol use: No  . Drug use: No     Allergies   Patient has no known allergies.   Review of Systems Review of Systems  Unable to perform ROS: Dementia     Physical Exam Updated Vital Signs BP 136/62   Pulse 79   Temp 98.2 F (36.8 C) (Oral)   Resp 18   Ht 5\' 6"  (1.676 m)   Wt 43.1 kg (95 lb)   BMI 15.33 kg/m   Physical Exam  Constitutional: She appears well-developed and well-nourished.  HENT:  Head: Normocephalic and atraumatic.  Right Ear: External ear normal.  Left Ear: External ear normal.  Nose: Nose normal.  Mouth/Throat: Mucous membranes are dry.  Eyes: Conjunctivae and EOM are normal. Pupils are equal, round, and reactive to light.  Neck: Normal range of motion. Neck supple.  Cardiovascular: Normal rate, regular rhythm, normal heart sounds and intact distal pulses.  Pulmonary/Chest: Effort normal and breath sounds normal.  Abdominal: Soft. Bowel sounds are normal.  Midline abdominal wall incision with tissue protruding.  Peritoneal fluid comes out with coughing.  Musculoskeletal: Normal range of motion.  Neurological: She is alert.  Skin: Skin is warm.  Psychiatric: She is agitated and combative.  Normal for patient.  Nursing note and vitals reviewed.      ED Treatments / Results  Labs (all labs ordered are listed, but only abnormal results are displayed) Labs Reviewed  BASIC METABOLIC PANEL - Abnormal; Notable for the following components:      Result Value   Potassium 3.0 (*)    Calcium 8.5 (*)    All other components within normal limits  CBC WITH DIFFERENTIAL/PLATELET - Abnormal; Notable for the following components:   WBC 13.3 (*)    Neutro Abs 11.6 (*)    All other components within normal limits    EKG  EKG Interpretation None       Radiology Ct Abdomen Pelvis W Contrast  Result Date:  01/01/2017 CLINICAL DATA:  Abdominal pain and purulent drainage from surgical wound. Approximately 1 week postop from small bowel resection for hernia with bowel obstruction. EXAM: CT ABDOMEN AND PELVIS WITH CONTRAST TECHNIQUE: Multidetector CT imaging of the abdomen and pelvis was performed using the standard protocol following bolus administration of intravenous contrast. CONTRAST:  86mL ISOVUE-300 IOPAMIDOL (ISOVUE-300) INJECTION 61% COMPARISON:  12/23/2016 FINDINGS: Lower Chest: New small to moderate bilateral pleural effusions and bibasilar atelectasis, left side greater than right. Hepatobiliary: No hepatic masses identified. Gallbladder is unremarkable. Pancreas:  No mass or  inflammatory changes. Spleen: Within normal limits in size and appearance. Adrenals/Urinary Tract: No significant change in 1.4 cm left adrenal mass since earlier CT in 2015, consistent with benign adrenal adenoma. Several tiny left renal cysts are stable. No evidence of renal mass or hydronephrosis. Unremarkable unopacified urinary bladder. Stomach/Bowel: No evidence of bowel obstruction or bowel wall thickening. A small midline ventral abdominal wall hernia is seen at site of surgical staples which contains a small bowel loop. No significant bowel wall thickening or dilatation. Vascular/Lymphatic: No pathologically enlarged lymph nodes. No abdominal aortic aneurysm. Aortic atherosclerosis. Reproductive: Stable small uterine fibroids, largest measuring approximately 3 cm. Other: New mild ascites and diffuse mesenteric and body wall edema. No evidence of abscess or free intraperitoneal air. Musculoskeletal: No suspicious bone lesions identified. Stable old L5 vertebral body compression fracture deformity. IMPRESSION: New bilateral pleural effusions, mild ascites, and diffuse mesenteric and body wall edema. No evidence of abscess or focal inflammatory process. Small midline incisional ventral abdominal wall hernia containing a small bowel  loop. No definite evidence of bowel obstruction or ischemia. Stable small uterine fibroids. Electronically Signed   By: Earle Gell M.D.   On: 01/01/2017 21:44   Dg Abd Acute W/chest  Result Date: 01/03/2017 CLINICAL DATA:  Pain. EXAM: DG ABDOMEN ACUTE W/ 1V CHEST COMPARISON:  Radiograph of December 23, 2016. FINDINGS: Mild cardiomegaly is noted. Atherosclerosis of thoracic aorta is noted. Mild bibasilar subsegmental atelectasis is noted with probable minimal bilateral pleural effusions. There is no abnormal bowel dilatation. Midline surgical staples are noted. Vascular calcifications are noted. IMPRESSION: No evidence of bowel obstruction or ileus. Mild bibasilar subsegmental atelectasis is noted with probable minimal bilateral pleural effusions. Electronically Signed   By: Marijo Conception, M.D.   On: 01/03/2017 16:22    Procedures Procedures (including critical care time)  Medications Ordered in ED Medications  haloperidol (HALDOL) tablet 5 mg (not administered)  mirtazapine (REMERON) tablet 30 mg (30 mg Oral Given 01/03/17 1729)  traZODone (DESYREL) tablet 200 mg (200 mg Oral Given 01/03/17 1723)  sodium chloride 0.9 % bolus 1,000 mL (0 mLs Intravenous Stopped 01/03/17 1703)  lidocaine-EPINEPHrine (XYLOCAINE W/EPI) 2 %-1:200000 (PF) injection 10 mL (10 mLs Infiltration Given 01/03/17 1534)  LORazepam (ATIVAN) tablet 1 mg (1 mg Oral Given 01/03/17 1723)     Initial Impression / Assessment and Plan / ED Course  I have reviewed the triage vital signs and the nursing notes.  Pertinent labs & imaging results that were available during my care of the patient were reviewed by me and considered in my medical decision making (see chart for details).     Pt d/w Dr. Arnoldo Morale.  I took the picture for him to look at it.   He suspects pt has some fascial dehiscence.  Initially he thought pt could go home and follow up on Tuesday.  However, after he started thinking about it, he decided it would be  best to watch it, so he will keep her for observation overnight with npo after midnight.   Final Clinical Impressions(s) / ED Diagnoses   Final diagnoses:  Wound dehiscence  Fascial defect    ED Discharge Orders    None       Isla Pence, MD 01/03/17 1644    Isla Pence, MD 01/03/17 1729

## 2017-01-03 NOTE — ED Notes (Signed)
Call for report no answer [phone],

## 2017-01-03 NOTE — ED Notes (Signed)
Meal provided, pt fed by Larrie Kass, NT

## 2017-01-03 NOTE — ED Triage Notes (Signed)
Patient brought in by family with bleeding from surgical site on abdominal area. Bleeding controlled at triage and small amount of tissue is protruding out of surgical site.

## 2017-01-03 NOTE — ED Notes (Signed)
Pt IV out, DC instructions signed, family expressed concerns that pt has no night time meds-   Dr Lemmie Evens ordered night time meds- pt dressed by family and placed into wheelchair While awaiting meds from pharm, pt slid into floor - Dr Lemmie Evens checked  Expressed to family, that she had called Surg who will admit and check in the am

## 2017-01-04 ENCOUNTER — Observation Stay (HOSPITAL_COMMUNITY): Payer: Medicare Other | Admitting: Anesthesiology

## 2017-01-04 ENCOUNTER — Encounter (HOSPITAL_COMMUNITY)
Admission: EM | Disposition: A | Discharge status: Skilled Nursing Facility | Payer: Self-pay | Source: Home / Self Care | Attending: General Surgery

## 2017-01-04 ENCOUNTER — Other Ambulatory Visit: Payer: Self-pay

## 2017-01-04 DIAGNOSIS — F209 Schizophrenia, unspecified: Secondary | ICD-10-CM | POA: Diagnosis not present

## 2017-01-04 DIAGNOSIS — E43 Unspecified severe protein-calorie malnutrition: Secondary | ICD-10-CM | POA: Diagnosis not present

## 2017-01-04 DIAGNOSIS — T8131XD Disruption of external operation (surgical) wound, not elsewhere classified, subsequent encounter: Secondary | ICD-10-CM | POA: Diagnosis not present

## 2017-01-04 DIAGNOSIS — R079 Chest pain, unspecified: Secondary | ICD-10-CM | POA: Diagnosis not present

## 2017-01-04 DIAGNOSIS — R109 Unspecified abdominal pain: Secondary | ICD-10-CM | POA: Diagnosis not present

## 2017-01-04 DIAGNOSIS — T8132XA Disruption of internal operation (surgical) wound, not elsewhere classified, initial encounter: Secondary | ICD-10-CM | POA: Diagnosis not present

## 2017-01-04 DIAGNOSIS — I10 Essential (primary) hypertension: Secondary | ICD-10-CM | POA: Diagnosis not present

## 2017-01-04 DIAGNOSIS — R636 Underweight: Secondary | ICD-10-CM | POA: Diagnosis present

## 2017-01-04 DIAGNOSIS — M629 Disorder of muscle, unspecified: Secondary | ICD-10-CM | POA: Diagnosis not present

## 2017-01-04 DIAGNOSIS — R1312 Dysphagia, oropharyngeal phase: Secondary | ICD-10-CM | POA: Diagnosis not present

## 2017-01-04 DIAGNOSIS — R41841 Cognitive communication deficit: Secondary | ICD-10-CM | POA: Diagnosis not present

## 2017-01-04 DIAGNOSIS — K559 Vascular disorder of intestine, unspecified: Secondary | ICD-10-CM | POA: Diagnosis not present

## 2017-01-04 DIAGNOSIS — Z9049 Acquired absence of other specified parts of digestive tract: Secondary | ICD-10-CM | POA: Diagnosis not present

## 2017-01-04 DIAGNOSIS — Z681 Body mass index (BMI) 19 or less, adult: Secondary | ICD-10-CM | POA: Diagnosis not present

## 2017-01-04 DIAGNOSIS — Z87891 Personal history of nicotine dependence: Secondary | ICD-10-CM | POA: Diagnosis not present

## 2017-01-04 DIAGNOSIS — K567 Ileus, unspecified: Secondary | ICD-10-CM | POA: Diagnosis not present

## 2017-01-04 DIAGNOSIS — Y836 Removal of other organ (partial) (total) as the cause of abnormal reaction of the patient, or of later complication, without mention of misadventure at the time of the procedure: Secondary | ICD-10-CM | POA: Diagnosis present

## 2017-01-04 DIAGNOSIS — R52 Pain, unspecified: Secondary | ICD-10-CM | POA: Diagnosis not present

## 2017-01-04 DIAGNOSIS — F319 Bipolar disorder, unspecified: Secondary | ICD-10-CM | POA: Diagnosis not present

## 2017-01-04 DIAGNOSIS — M6281 Muscle weakness (generalized): Secondary | ICD-10-CM | POA: Diagnosis not present

## 2017-01-04 DIAGNOSIS — K56699 Other intestinal obstruction unspecified as to partial versus complete obstruction: Secondary | ICD-10-CM | POA: Diagnosis not present

## 2017-01-04 DIAGNOSIS — R2681 Unsteadiness on feet: Secondary | ICD-10-CM | POA: Diagnosis not present

## 2017-01-04 DIAGNOSIS — F0391 Unspecified dementia with behavioral disturbance: Secondary | ICD-10-CM | POA: Diagnosis not present

## 2017-01-04 DIAGNOSIS — F0390 Unspecified dementia without behavioral disturbance: Secondary | ICD-10-CM | POA: Diagnosis not present

## 2017-01-04 DIAGNOSIS — E876 Hypokalemia: Secondary | ICD-10-CM | POA: Diagnosis not present

## 2017-01-04 DIAGNOSIS — K439 Ventral hernia without obstruction or gangrene: Secondary | ICD-10-CM | POA: Diagnosis not present

## 2017-01-04 DIAGNOSIS — T8131XA Disruption of external operation (surgical) wound, not elsewhere classified, initial encounter: Secondary | ICD-10-CM | POA: Diagnosis not present

## 2017-01-04 HISTORY — PX: INCISIONAL HERNIA REPAIR: SHX193

## 2017-01-04 LAB — CBC
HEMATOCRIT: 37.2 % (ref 36.0–46.0)
HEMOGLOBIN: 12.1 g/dL (ref 12.0–15.0)
MCH: 29.4 pg (ref 26.0–34.0)
MCHC: 32.5 g/dL (ref 30.0–36.0)
MCV: 90.3 fL (ref 78.0–100.0)
Platelets: 288 10*3/uL (ref 150–400)
RBC: 4.12 MIL/uL (ref 3.87–5.11)
RDW: 14.2 % (ref 11.5–15.5)
WBC: 12.3 10*3/uL — ABNORMAL HIGH (ref 4.0–10.5)

## 2017-01-04 LAB — BASIC METABOLIC PANEL
Anion gap: 8 (ref 5–15)
BUN: 6 mg/dL (ref 6–20)
CALCIUM: 8.2 mg/dL — AB (ref 8.9–10.3)
CHLORIDE: 103 mmol/L (ref 101–111)
CO2: 27 mmol/L (ref 22–32)
CREATININE: 0.51 mg/dL (ref 0.44–1.00)
GFR calc Af Amer: >60 mL/min (ref >60)
GFR calc non Af Amer: >60 mL/min (ref >60)
GLUCOSE: 94 mg/dL (ref 65–99)
Potassium: 3.8 mmol/L (ref 3.5–5.1)
Sodium: 138 mmol/L (ref 135–145)

## 2017-01-04 LAB — PHOSPHORUS: Phosphorus: 1.9 mg/dL — ABNORMAL LOW (ref 2.5–4.6)

## 2017-01-04 LAB — MAGNESIUM: Magnesium: 1.5 mg/dL — ABNORMAL LOW (ref 1.7–2.4)

## 2017-01-04 SURGERY — REPAIR, HERNIA, INCISIONAL
Anesthesia: General

## 2017-01-04 MED ORDER — CHLORHEXIDINE GLUCONATE CLOTH 2 % EX PADS: 6.0000 | MEDICATED_PAD | Freq: Once | CUTANEOUS | Status: DC

## 2017-01-04 MED ORDER — CHLORHEXIDINE GLUCONATE CLOTH 2 % EX PADS
6.0000 | MEDICATED_PAD | Freq: Once | CUTANEOUS | Status: AC
  Administered 2017-01-04: 6 via TOPICAL

## 2017-01-04 MED ORDER — SUCCINYLCHOLINE CHLORIDE 20 MG/ML IJ SOLN
INTRAMUSCULAR | Status: AC
  Filled 2017-01-04: qty 1

## 2017-01-04 MED ORDER — POVIDONE-IODINE 10 % OINT PACKET
TOPICAL_OINTMENT | CUTANEOUS | Status: DC | PRN
  Administered 2017-01-04: 1 via TOPICAL

## 2017-01-04 MED ORDER — LACTATED RINGERS IV SOLN
INTRAVENOUS | Status: DC | PRN
  Administered 2017-01-04: 08:00:00 via INTRAVENOUS

## 2017-01-04 MED ORDER — EPHEDRINE SULFATE 50 MG/ML IJ SOLN
INTRAMUSCULAR | Status: AC
  Filled 2017-01-04: qty 1

## 2017-01-04 MED ORDER — LIDOCAINE HCL (PF) 1 % IJ SOLN
INTRAMUSCULAR | Status: AC
  Filled 2017-01-04: qty 5

## 2017-01-04 MED ORDER — ETOMIDATE 2 MG/ML IV SOLN
INTRAVENOUS | Status: AC
  Filled 2017-01-04: qty 10

## 2017-01-04 MED ORDER — 0.9 % SODIUM CHLORIDE (POUR BTL) OPTIME
TOPICAL | Status: DC | PRN
  Administered 2017-01-04: 1000 mL

## 2017-01-04 MED ORDER — ETOMIDATE 2 MG/ML IV SOLN
INTRAVENOUS | Status: DC | PRN
  Administered 2017-01-04: 10 mg via INTRAVENOUS

## 2017-01-04 MED ORDER — EPHEDRINE SULFATE 50 MG/ML IJ SOLN
INTRAMUSCULAR | Status: DC | PRN
  Administered 2017-01-04: 10 mg via INTRAVENOUS
  Administered 2017-01-04: 5 mg via INTRAVENOUS
  Administered 2017-01-04 (×3): 10 mg via INTRAVENOUS

## 2017-01-04 MED ORDER — SODIUM CHLORIDE 0.9 % IJ SOLN
INTRAMUSCULAR | Status: AC
  Filled 2017-01-04: qty 10

## 2017-01-04 MED ORDER — KETOROLAC TROMETHAMINE 30 MG/ML IJ SOLN: 15.0000 mg | Freq: Four times a day (QID) | INTRAMUSCULAR | Status: DC | PRN

## 2017-01-04 MED ORDER — LORAZEPAM 2 MG/ML IJ SOLN: 0.5000 mg | Freq: Once | INTRAMUSCULAR | Status: AC

## 2017-01-04 MED ORDER — VANCOMYCIN HCL IN DEXTROSE 750-5 MG/150ML-% IV SOLN
750.0000 mg | INTRAVENOUS | Status: DC
  Administered 2017-01-05 – 2017-01-07 (×3): 750 mg via INTRAVENOUS
  Filled 2017-01-04 (×4): qty 150

## 2017-01-04 MED ORDER — HALOPERIDOL LACTATE 5 MG/ML IJ SOLN: 2.0000 mg | Freq: Four times a day (QID) | INTRAMUSCULAR | Status: DC | PRN

## 2017-01-04 MED ORDER — KETOROLAC TROMETHAMINE 30 MG/ML IJ SOLN
30.0000 mg | Freq: Once | INTRAMUSCULAR | Status: AC
  Administered 2017-01-04: 30 mg via INTRAVENOUS
  Filled 2017-01-04: qty 1

## 2017-01-04 MED ORDER — BUPIVACAINE LIPOSOME 1.3 % IJ SUSP
INTRAMUSCULAR | Status: AC
  Filled 2017-01-04: qty 20

## 2017-01-04 MED ORDER — SUCCINYLCHOLINE CHLORIDE 20 MG/ML IJ SOLN
INTRAMUSCULAR | Status: DC | PRN
  Administered 2017-01-04: 80 mg via INTRAVENOUS

## 2017-01-04 MED ORDER — POVIDONE-IODINE 10 % EX OINT
TOPICAL_OINTMENT | CUTANEOUS | Status: AC
  Filled 2017-01-04: qty 1

## 2017-01-04 MED ORDER — FENTANYL CITRATE (PF) 100 MCG/2ML IJ SOLN
INTRAMUSCULAR | Status: AC
  Filled 2017-01-04: qty 2

## 2017-01-04 MED ORDER — FENTANYL CITRATE (PF) 100 MCG/2ML IJ SOLN
INTRAMUSCULAR | Status: DC | PRN
  Administered 2017-01-04: 25 ug via INTRAVENOUS

## 2017-01-04 MED ORDER — LACTATED RINGERS IV SOLN
INTRAVENOUS | Status: DC
  Administered 2017-01-04 – 2017-01-05 (×2): via INTRAVENOUS

## 2017-01-04 MED ORDER — BUPIVACAINE LIPOSOME 1.3 % IJ SUSP
INTRAMUSCULAR | Status: DC | PRN
  Administered 2017-01-04: 20 mL

## 2017-01-04 MED ORDER — VANCOMYCIN HCL IN DEXTROSE 1-5 GM/200ML-% IV SOLN
1000.0000 mg | Freq: Once | INTRAVENOUS | Status: AC
  Administered 2017-01-04: 1000 mg via INTRAVENOUS
  Filled 2017-01-04: qty 200

## 2017-01-04 MED ORDER — ROCURONIUM BROMIDE 50 MG/5ML IV SOLN
INTRAVENOUS | Status: AC
  Filled 2017-01-04: qty 1

## 2017-01-04 MED ORDER — PROPOFOL 10 MG/ML IV BOLUS
INTRAVENOUS | Status: AC
  Filled 2017-01-04: qty 20

## 2017-01-04 MED ORDER — MIDAZOLAM HCL 2 MG/2ML IJ SOLN
INTRAMUSCULAR | Status: DC | PRN
  Administered 2017-01-04 (×2): 1 mg via INTRAVENOUS

## 2017-01-04 MED ORDER — MIDAZOLAM HCL 2 MG/2ML IJ SOLN
INTRAMUSCULAR | Status: AC
  Filled 2017-01-04: qty 2

## 2017-01-04 MED ORDER — ROCURONIUM BROMIDE 100 MG/10ML IV SOLN
INTRAVENOUS | Status: DC | PRN
  Administered 2017-01-04: 5 mg via INTRAVENOUS

## 2017-01-04 SURGICAL SUPPLY — 27 items
COVER LIGHT HANDLE STERIS (MISCELLANEOUS) ×6 IMPLANT
GAUZE SPONGE 4X4 12PLY STRL (GAUZE/BANDAGES/DRESSINGS) ×3 IMPLANT
GLOVE BIOGEL PI IND STRL 6.5 (GLOVE) ×1 IMPLANT
GLOVE BIOGEL PI IND STRL 7.0 (GLOVE) ×2 IMPLANT
GLOVE BIOGEL PI INDICATOR 6.5 (GLOVE) ×2
GLOVE BIOGEL PI INDICATOR 7.0 (GLOVE) ×2
GLOVE SURG SS PI 6.5 STRL IVOR (GLOVE) ×4 IMPLANT
GLOVE SURG SS PI 7.5 STRL IVOR (GLOVE) ×4 IMPLANT
GOWN STRL REUS W/ TWL LRG LVL3 (GOWN DISPOSABLE) ×2 IMPLANT
GOWN STRL REUS W/TWL LRG LVL3 (GOWN DISPOSABLE) ×11 IMPLANT
HANDLE SUCTION POOLE (INSTRUMENTS) ×1 IMPLANT
INST SET MINOR GENERAL (KITS) ×3 IMPLANT
KIT ROOM TURNOVER APOR (KITS) ×3 IMPLANT
LIGASURE IMPACT 36 18CM CVD LR (INSTRUMENTS) ×3 IMPLANT
MESH VENTRALEX ST 8CM LRG (Mesh General) ×3 IMPLANT
NEEDLE HYPO 22GX1.5 SAFETY (NEEDLE) ×6 IMPLANT
NS IRRIG 1000ML POUR BTL (IV SOLUTION) ×3 IMPLANT
PACK ABDOMINAL MAJOR (CUSTOM PROCEDURE TRAY) ×3 IMPLANT
SET BASIN LINEN APH (SET/KITS/TRAYS/PACK) ×4 IMPLANT
SPONGE LAP 18X18 X RAY DECT (DISPOSABLE) ×3 IMPLANT
SUCTION POOLE HANDLE (INSTRUMENTS) ×4
SUT PROLENE 0 CT 1 CR/8 (SUTURE) ×6 IMPLANT
SUT PROLENE 2 0 CR (SUTURE) ×4 IMPLANT
SUT PROLENE 3 0 PS 2 (SUTURE) ×3 IMPLANT
SYR 30ML LL (SYRINGE) ×3 IMPLANT
SYRINGE 20CC LL (MISCELLANEOUS) ×4 IMPLANT
TAPE CLOTH SURG 4X10 WHT LF (GAUZE/BANDAGES/DRESSINGS) ×4 IMPLANT

## 2017-01-04 NOTE — Anesthesia Postprocedure Evaluation (Signed)
Anesthesia Post Note  Patient: Brittney Wall  Procedure(s) Performed: [place]  Patient location during evaluation: PACU Anesthesia Type: General Level of consciousness: awake and alert Pain management: satisfactory to patient Vital Signs Assessment: post-procedure vital signs reviewed and stable Respiratory status: spontaneous breathing Cardiovascular status: stable Postop Assessment: no apparent nausea or vomiting Anesthetic complications: no     Last Vitals:  Vitals:   01/04/17 1015 01/04/17 1036  BP: (!) 177/72 (!) 183/84  Pulse: 76 76  Resp: 14 16  Temp:    SpO2: 96% 99%    Last Pain:  Vitals:   01/04/17 1015  TempSrc:   PainSc: 0-No pain                 Tycen Dockter

## 2017-01-04 NOTE — Anesthesia Preprocedure Evaluation (Signed)
Anesthesia Evaluation  Patient identified by MRN, date of birth, ID band Patient confused  General Assessment Comment:Ox0, responds slowly to commands   Reviewed: Allergy & Precautions, NPO status , Patient's Chart, lab work & pertinent test results  Airway Mallampati: III  TM Distance: <3 FB   Mouth opening: Limited Mouth Opening  Dental  (+) Edentulous Upper, Edentulous Lower   Pulmonary former smoker,    + rhonchi        Cardiovascular hypertension, Pt. on medications  Rhythm:Regular Rate:Normal     Neuro/Psych PSYCHIATRIC DISORDERS Bipolar Disorder Schizophrenia    GI/Hepatic Neg liver ROS, Hernia with SBO   Endo/Other    Renal/GU negative Renal ROS     Musculoskeletal   Abdominal   Peds  Hematology negative hematology ROS (+)   Anesthesia Other Findings   Reproductive/Obstetrics                             Anesthesia Physical Anesthesia Plan  ASA: IV and emergent  Anesthesia Plan: General   Post-op Pain Management:    Induction: Intravenous, Rapid sequence and Cricoid pressure planned  PONV Risk Score and Plan:   Airway Management Planned: Oral ETT  Additional Equipment:   Intra-op Plan:   Post-operative Plan: Extubation in OR  Informed Consent:   Plan Discussed with: Surgeon  Anesthesia Plan Comments:         Anesthesia Quick Evaluation

## 2017-01-04 NOTE — Progress Notes (Signed)
Patient is been combative during the night. Constantly trying to come  out of the bed. Requires one on one sitter. 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 Pulling on IV tube. Haldol did not help her agitation. Order received for bilateral wrist restraint.

## 2017-01-04 NOTE — Op Note (Signed)
Patient:  Brittney Wall  DOB:  29-Jan-1946  MRN:  [id number]   Preop Diagnosis: Wound dehiscence  Postop Diagnosis: Same  Procedure: Incisional herniorrhaphy with mesh  Surgeon: Aviva Signs, MD  Anes: General endotracheal  Indications: Patient is a 72 year old black female with multiple medical problems who is status post Sporter laparotomy with partial small bowel resection almost 2 weeks ago who presented to the emergency room with a wound dehiscence.  This did include the fascia.  Patient now comes to the operating room for closure of the wound.  The risks and benefits of the procedure were explained to the patient's family, who gave informed consent for the patient as the patient is demented.  Procedure note: The patient was placed in supine position.  After induction of general endotracheal anesthesia, the abdomen was prepped and draped using usual sterile technique with Betadine.  Surgical site confirmation was performed.  Previously placed skin staples were removed.  The upper two thirds of the incision was opened.  The patient had an incarcerated piece of omentum through the skin.  This was excised using the LigaSure without difficulty.  The bowel underlying the fascia was noted to be intact.  The fascial defect measured approximately 5 cm in its greatest diameter.  There was no purulent drainage present.  There was no evidence of infection.  The abdomen was then copiously irrigated with normal saline.  An 8cm Bard Ventralax ST patch was then inserted and secured to the fascia using 0 Prolene interrupted sutures.  The overlying fascia was then reapproximated using 0 Prolene interrupted sutures.  Subcutaneous layer was irrigated with normal saline.  Exparel was instilled into the surrounding wound.  The skin was closed using 2-0 Prolene vertical mattress sutures.  Betadine ointment and a dry sterile dressing were applied.  All tape needle counts were correct at the end of the procedure.   The patient was extubated in the operating room and transferred to PACU in stable condition.  Complications: None  EBL: Minimal  Specimen: None

## 2017-01-04 NOTE — Transfer of Care (Signed)
Immediate Anesthesia Transfer of Care Note  Patient: Brittney Wall  Procedure(s) Performed: Jerry Caras WITH MESH  Patient Location: PACU  Anesthesia Type:General  Level of Consciousness: sedated  Airway & Oxygen Therapy: Patient Spontanous Breathing and non-rebreather face mask  Post-op Assessment: Report given to RN and Post -op Vital signs reviewed and stable  Post vital signs: Reviewed and stable  Last Vitals:  Vitals:   01/03/17 2210 01/04/17 0300  BP: (!) 177/76 (!) 141/62  Pulse: (!) 18 75  Resp:  18  Temp:  36.4 C  SpO2: 100% 100%    Last Pain:  Vitals:   01/04/17 0300  TempSrc: Oral  PainSc:          Complications: No apparent anesthesia complications

## 2017-01-04 NOTE — Progress Notes (Signed)
Pharmacy Antibiotic Note  Brittney Wall is a 71 y.o. female admitted on 01/03/2017 with wound dehiscence.  Pharmacy has been consulted for VANCOMYCIN dosing.  Plan: Vancomycin 1000mg  IV x 1 then 750mg  q24hrs Monitor labs, progress, c/s  Height: 5\' 6"  (167.6 cm) Weight: 103 lb 8 oz (46.9 kg) IBW/kg (Calculated) : 59.3  Temp (24hrs), Avg:97.8 F (36.6 C), Min:97.6 F (36.4 C), Max:98 F (36.7 C)  Recent Labs  Lab 01/01/17 1625 01/03/17 1515 01/04/17 0559  WBC 10.0 13.3* 12.3*  CREATININE 0.54 0.57 0.51    Estimated Creatinine Clearance: 48.4 mL/min (by C-G formula based on SCr of 0.51 mg/dL).    No Known Allergies  Antimicrobials this admission: Vancomycin 11/24 >>   Dose adjustments this admission:  Microbiology results:  BCx:   UCx:    Sputum:    MRSA PCR:   Thank you for allowing pharmacy to be a part of this patient's care.  Hart Robinsons A 01/04/2017 1:14 PM

## 2017-01-04 NOTE — Progress Notes (Signed)
**Note De-Identified  Obfuscation** Incentive Spirometer delivered to patient. Patient unable at time per RN to follow directions post  procedure.   RRT to continue to monitor

## 2017-01-04 NOTE — Addendum Note (Signed)
Addendum  created 01/04/17 1256 by Vista Deck, CRNA   Sign clinical note

## 2017-01-04 NOTE — Anesthesia Postprocedure Evaluation (Signed)
Anesthesia Post Note  Patient: Brittney Wall  Procedure(s) Performed: [place]  Patient location during evaluation: Nursing Unit Anesthesia Type: General Level of consciousness: awake and alert Pain management: satisfactory to patient Vital Signs Assessment: post-procedure vital signs reviewed and stable Respiratory status: spontaneous breathing Cardiovascular status: stable Postop Assessment: no apparent nausea or vomiting and adequate PO intake Anesthetic complications: no     Last Vitals:  Vitals:   01/04/17 1015 01/04/17 1036  BP: (!) 177/72 (!) 183/84  Pulse: 76 76  Resp: 14 16  Temp:    SpO2: 96% 99%    Last Pain:  Vitals:   01/04/17 1015  TempSrc:   PainSc: 0-No pain                 Caymen Dubray

## 2017-01-04 NOTE — Interval H&P Note (Signed)
History and Physical Interval Note:  01/04/2017 7:47 AM  Brittney Wall  has presented today for surgery, with the diagnosis of abdominal wound dehiscence  The various methods of treatment have been discussed with the patient and family. After consideration of risks, benefits and other options for treatment, the patient has consented to  Procedure(s): CLOSURE OF  ABDOMINAL WOUND (N/A) as a surgical intervention .  The patient's history has been reviewed, patient examined, no change in status, stable for surgery.  I have reviewed the patient's chart and labs.  Questions were answered to the patient's satisfaction.     Aviva Signs

## 2017-01-04 NOTE — Progress Notes (Signed)
Patient is resting comfortably. Bilateral wrist restraint is on. Sitter is present in patient's room.

## 2017-01-04 NOTE — Anesthesia Procedure Notes (Signed)
Procedure Name: Intubation Date/Time: 01/04/2017 8:13 AM Performed by: Vista Deck, CRNA Pre-anesthesia Checklist: Patient identified, Emergency Drugs available, Suction available, Patient being monitored and Timeout performed Patient Re-evaluated:Patient Re-evaluated prior to induction Oxygen Delivery Method: Circle system utilized Preoxygenation: Pre-oxygenation with 100% oxygen Induction Type: IV induction, Rapid sequence and Cricoid Pressure applied Laryngoscope Size: Glidescope and 3 Grade View: Grade I Tube type: Oral Tube size: 7.0 mm Number of attempts: 1 Airway Equipment and Method: Video-laryngoscopy,  Stylet and Oral airway (Lopro 3) Placement Confirmation: ETT inserted through vocal cords under direct vision,  positive ETCO2 and breath sounds checked- equal and bilateral Secured at: 22 cm Tube secured with: Tape Dental Injury: Teeth and Oropharynx as per pre-operative assessment

## 2017-01-05 LAB — BASIC METABOLIC PANEL
Anion gap: 4 — ABNORMAL LOW (ref 5–15)
BUN: 10 mg/dL (ref 6–20)
CO2: 28 mmol/L (ref 22–32)
Calcium: 8.1 mg/dL — ABNORMAL LOW (ref 8.9–10.3)
Chloride: 104 mmol/L (ref 101–111)
Creatinine, Ser: 0.61 mg/dL (ref 0.44–1.00)
GFR calc Af Amer: >60 mL/min (ref >60)
Glucose, Bld: 91 mg/dL (ref 65–99)
POTASSIUM: 4.3 mmol/L (ref 3.5–5.1)
SODIUM: 136 mmol/L (ref 135–145)

## 2017-01-05 LAB — CBC
HCT: 30.3 % — ABNORMAL LOW (ref 36.0–46.0)
Hemoglobin: 9.9 g/dL — ABNORMAL LOW (ref 12.0–15.0)
MCH: 29.7 pg (ref 26.0–34.0)
MCHC: 32.7 g/dL (ref 30.0–36.0)
MCV: 91 fL (ref 78.0–100.0)
PLATELETS: 301 10*3/uL (ref 150–400)
RBC: 3.33 MIL/uL — AB (ref 3.87–5.11)
RDW: 14.2 % (ref 11.5–15.5)
WBC: 11.7 10*3/uL — AB (ref 4.0–10.5)

## 2017-01-05 LAB — PHOSPHORUS: Phosphorus: 1.9 mg/dL — ABNORMAL LOW (ref 2.5–4.6)

## 2017-01-05 LAB — MAGNESIUM: MAGNESIUM: 1.3 mg/dL — AB (ref 1.7–2.4)

## 2017-01-05 MED ORDER — MAGNESIUM SULFATE 2 GM/50ML IV SOLN
2.0000 g | Freq: Once | INTRAVENOUS | Status: AC
Start: 2017-01-05 — End: 2017-01-05
  Administered 2017-01-05: 2 g via INTRAVENOUS
  Filled 2017-01-05: qty 50

## 2017-01-05 MED ORDER — MAGNESIUM HYDROXIDE 400 MG/5ML PO SUSP
15.0000 mL | Freq: Three times a day (TID) | ORAL | Status: DC
  Administered 2017-01-05 – 2017-01-07 (×7): 15 mL via ORAL
  Filled 2017-01-05 (×7): qty 30

## 2017-01-05 MED ORDER — K PHOS MONO-SOD PHOS DI & MONO 155-852-130 MG PO TABS
500.0000 mg | ORAL_TABLET | ORAL | Status: AC
  Administered 2017-01-05 (×2): 500 mg via ORAL
  Filled 2017-01-05 (×3): qty 2

## 2017-01-05 NOTE — Progress Notes (Signed)
1 Day Post-Op  Subjective: Patient appears comfortable.  Being fed by family.  Objective: Vital signs in last 24 hours: Temp:  [98.2 F (36.8 C)-98.7 F (37.1 C)] 98.7 F (37.1 C) (11/25 0600) Pulse Rate:  [69-86] 84 (11/25 0600) Resp:  [18] 18 (11/25 0600) BP: (125-147)/(58-69) 144/58 (11/25 0600) SpO2:  [94 %-100 %] 100 % (11/25 0600)    Intake/Output from previous day: 11/24 0701 - 11/25 0700 In: 1765.8 [P.O.:720; I.V.:1045.8] Out: 10 [Blood:10] Intake/Output this shift: Total I/O In: 240 [P.O.:240] Out: -   General appearance: alert, cooperative and no distress Resp: clear to auscultation bilaterally Cardio: regular rate and rhythm, S1, S2 normal, no murmur, click, rub or gallop GI: Soft, dressing dry and intact.  No distention noted.  Lab Results:  Recent Labs    01/04/17 0559 01/05/17 0752  WBC 12.3* 11.7*  HGB 12.1 9.9*  HCT 37.2 30.3*  PLT 288 301   BMET Recent Labs    01/04/17 0559 01/05/17 0752  NA 138 136  K 3.8 4.3  CL 103 104  CO2 27 28  GLUCOSE 94 91  BUN 6 10  CREATININE 0.51 0.61  CALCIUM 8.2* 8.1*   PT/INR No results for input(s): LABPROT, INR in the last 72 hours.  Studies/Results: Dg Abd Acute W/chest  Result Date: 01/03/2017 CLINICAL DATA:  Pain. EXAM: DG ABDOMEN ACUTE W/ 1V CHEST COMPARISON:  Radiograph of December 23, 2016. FINDINGS: Mild cardiomegaly is noted. Atherosclerosis of thoracic aorta is noted. Mild bibasilar subsegmental atelectasis is noted with probable minimal bilateral pleural effusions. There is no abnormal bowel dilatation. Midline surgical staples are noted. Vascular calcifications are noted. IMPRESSION: No evidence of bowel obstruction or ileus. Mild bibasilar subsegmental atelectasis is noted with probable minimal bilateral pleural effusions. Electronically Signed   By: Marijo Conception, M.D.   On: 01/03/2017 16:22    Anti-infectives: Anti-infectives (From admission, onward)   Start     Dose/Rate Route  Frequency Ordered Stop   01/05/17 0600  vancomycin (VANCOCIN) IVPB 750 mg/150 ml premix     750 mg 150 mL/hr over 60 Minutes Intravenous Every 24 hours 01/04/17 1041     01/04/17 1100  vancomycin (VANCOCIN) IVPB 1000 mg/200 mL premix     1,000 mg 200 mL/hr over 60 Minutes Intravenous  Once 01/04/17 1041 01/04/17 1305   01/03/17 2000  ceFAZolin (ANCEF) IVPB 1 g/50 mL premix  Status:  Discontinued     1 g 100 mL/hr over 30 Minutes Intravenous Every 8 hours 01/03/17 1908 01/04/17 1035      Assessment/Plan: s/p Procedure(s): INCISIONAL HERNIORRHAPY WITH MESH Impression: Stable on postoperative day 1.  Hypomagnesemia and hypophosphatemia are noted and being treated.  Diet has been advanced.  Anticipate discharge in next 24-48 hours.  LOS: 1 day    Aviva Signs 01/05/2017

## 2017-01-05 NOTE — Progress Notes (Signed)
MEDICATION RELATED CONSULT NOTE - INITIAL   Pharmacy Consult for treatment of HYPOphosphatemia Indication: Phos level 1.9 today  No Known Allergies  Patient Measurements: Height: 5\' 6"  (167.6 cm) Weight: 103 lb 8 oz (46.9 kg) IBW/kg (Calculated) : 59.3  Vital Signs: Temp: 98.7 F (37.1 C) (11/25 0600) Temp Source: Axillary (11/25 0600) BP: 144/58 (11/25 0600) Pulse Rate: 84 (11/25 0600) Intake/Output from previous day: 11/24 0701 - 11/25 0700 In: 1765.8 [P.O.:720; I.V.:1045.8] Out: 10 [Blood:10] Intake/Output from this shift: Total I/O In: 240 [P.O.:240] Out: -   Labs: Recent Labs    01/03/17 1515 01/04/17 0559 01/05/17 0752  WBC 13.3* 12.3* 11.7*  HGB 12.2 12.1 9.9*  HCT 37.2 37.2 30.3*  PLT 342 288 301  CREATININE 0.57 0.51 0.61  MG  --  1.5* 1.3*  PHOS  --  1.9* 1.9*   Estimated Creatinine Clearance: 48.4 mL/min (by C-G formula based on SCr of 0.61 mg/dL).  Medical History: Past Medical History:  Diagnosis Date  . Bipolar 1 disorder ([place])   . Dementia   . Hypertension   . Left adrenal mass ([place]) 09/06/2016  . Pneumothorax on right 09/07/2016  . Schizophrenia ([place])    Medications:  Scheduled:  . cloNIDine  0.1 mg Oral TID  . enoxaparin (LOVENOX) injection  30 mg Subcutaneous Q24H  . haloperidol  5 mg Oral QHS  . hydrALAZINE  10 mg Oral QHS  . magnesium hydroxide  15 mL Oral TID PC  . megestrol  800 mg Oral Daily  . phosphorus  500 mg Oral Q4H while awake    Assessment: 71yo female s/p incisional herniorrhapy with mesh.  Pt's diet being advance and pt is taking PO meds.  Phos level noted to be 1.9.  Goal of Therapy:  Replace Phos to WNL (2.5 - 4.6)  Plan:  K-Phos 500mg  PO q4h x 3 doses today F/U labs in am (can switch to IV if pt has difficulty taking tablets)  Hart Robinsons A 01/05/2017,11:55 AM

## 2017-01-06 ENCOUNTER — Encounter (HOSPITAL_COMMUNITY): Payer: Self-pay | Admitting: General Surgery

## 2017-01-06 LAB — BASIC METABOLIC PANEL
Anion gap: 7 (ref 5–15)
BUN: 11 mg/dL (ref 6–20)
CHLORIDE: 103 mmol/L (ref 101–111)
CO2: 27 mmol/L (ref 22–32)
CREATININE: 0.61 mg/dL (ref 0.44–1.00)
Calcium: 8.4 mg/dL — ABNORMAL LOW (ref 8.9–10.3)
GFR calc Af Amer: >60 mL/min (ref >60)
GFR calc non Af Amer: >60 mL/min (ref >60)
Glucose, Bld: 103 mg/dL — ABNORMAL HIGH (ref 65–99)
Potassium: 4.6 mmol/L (ref 3.5–5.1)
Sodium: 137 mmol/L (ref 135–145)

## 2017-01-06 LAB — CBC
HEMATOCRIT: 40.5 % (ref 36.0–46.0)
HEMOGLOBIN: 12.8 g/dL (ref 12.0–15.0)
MCH: 29.2 pg (ref 26.0–34.0)
MCHC: 31.6 g/dL (ref 30.0–36.0)
MCV: 92.5 fL (ref 78.0–100.0)
Platelets: 316 10*3/uL (ref 150–400)
RBC: 4.38 MIL/uL (ref 3.87–5.11)
RDW: 14.3 % (ref 11.5–15.5)
WBC: 10.6 10*3/uL — ABNORMAL HIGH (ref 4.0–10.5)

## 2017-01-06 LAB — MAGNESIUM: Magnesium: 2 mg/dL (ref 1.7–2.4)

## 2017-01-06 LAB — PHOSPHORUS: Phosphorus: 2.9 mg/dL (ref 2.5–4.6)

## 2017-01-06 MED ORDER — MAGNESIUM HYDROXIDE 400 MG/5ML PO SUSP: 15.0000 mL | Freq: Every day | ORAL | 0 refills | Status: DC

## 2017-01-06 MED ORDER — HYDROCODONE-ACETAMINOPHEN 7.5-325 MG/15ML PO SOLN: 5.0000 mL | Freq: Four times a day (QID) | ORAL | 0 refills | Status: DC | PRN

## 2017-01-06 MED ORDER — K PHOS MONO-SOD PHOS DI & MONO 155-852-130 MG PO TABS
500.0000 mg | ORAL_TABLET | ORAL | Status: AC
  Administered 2017-01-06 (×3): 500 mg via ORAL
  Filled 2017-01-06 (×3): qty 2

## 2017-01-06 MED ORDER — MAGNESIUM SULFATE 4 GM/100ML IV SOLN
4.0000 g | Freq: Once | INTRAVENOUS | Status: AC
  Administered 2017-01-06: 4 g via INTRAVENOUS
  Filled 2017-01-06: qty 100

## 2017-01-06 MED ORDER — HYDROCODONE-ACETAMINOPHEN 7.5-325 MG/15ML PO SOLN
5.0000 mL | Freq: Four times a day (QID) | ORAL | Status: DC | PRN
  Administered 2017-01-06 – 2017-01-07 (×2): 5 mL via ORAL
  Filled 2017-01-06 (×2): qty 15

## 2017-01-06 NOTE — Progress Notes (Signed)
[place] for treatment of HYPOphosphatemia Indication: Phos level 1.9 today  No Known Allergies  Patient Measurements: Height: 5\' 6"  (167.6 cm) Weight: 103 lb 8 oz (46.9 kg) IBW/kg (Calculated) : 59.3  Vital Signs: Temp: 97.6 F (36.4 C) (11/26 0656) Temp Source: Axillary (11/26 0656) BP: 171/69 (11/26 0656) Pulse Rate: 78 (11/26 0656) Intake/Output from previous day: 11/25 0701 - 11/26 0700 In: 1845.5 [P.O.:240; I.V.:1455.5; IV Piggyback:150] Out: -  Intake/Output from this shift: No intake/output data recorded.  Labs: Recent Labs    01/03/17 1515 01/04/17 0559 01/05/17 0752  WBC 13.3* 12.3* 11.7*  HGB 12.2 12.1 9.9*  HCT 37.2 37.2 30.3*  PLT 342 288 301  CREATININE 0.57 0.51 0.61  MG  --  1.5* 1.3*  PHOS  --  1.9* 1.9*   Estimated Creatinine Clearance: 48.4 mL/min (by C-G formula based on SCr of 0.61 mg/dL).  Medical History: Past Medical History:  Diagnosis Date  . Bipolar 1 disorder ([place])   . Dementia   . Hypertension   . Left adrenal mass ([place]) 09/06/2016  . Pneumothorax on right 09/07/2016  . Schizophrenia ([place])    Medications:  Scheduled:  . cloNIDine  0.1 mg Oral TID  . enoxaparin (LOVENOX) injection  30 mg Subcutaneous Q24H  . haloperidol  5 mg Oral QHS  . hydrALAZINE  10 mg Oral QHS  . magnesium hydroxide  15 mL Oral TID PC  . megestrol  800 mg Oral Daily  . phosphorus  500 mg Oral Q4H while awake    Assessment: 71yo female s/p incisional herniorrhapy with mesh.  Pt's diet being advance and pt is taking PO meds.  She received 2 doses of K Phos yesterday.  Phos level still 1.9  Goal of Therapy:  Replace Phos to WNL (2.5 - 4.6)  Plan:  Repeat K-Phos 500mg  PO q4h x 3 doses today F/U labs in am (can switch to IV if pt has difficulty taking tablets)  Brittney Wall 01/06/2017,8:16 AM

## 2017-01-06 NOTE — Discharge Summary (Signed)
Physician Discharge Summary  Patient ID: Brittney Wall MRN: [id number] DOB/AGE: Jan 17, 1946 71 y.o.  Admit date: 01/03/2017 Discharge date: 01/07/2017  Admission Diagnoses: Wound dehiscence   Discharge Diagnoses:  Principal Problem:   Wound dehiscence Active Problems:   Fascial defect  Discharged Condition: fair  Hospital Course: [name] is a 71 yo with dementia who underwent an Ex lap with SBR for a incarcerated obturator hernia a few weeks ago, and presented back to the hospital with drainage and tissue from her wound.  She had been known to have some drainage thought to be from a partial fascial dehiscence or seroma, but this progressed to a dehiscence of the skin as the patient continued to pick at her staples.  She was brought into the hospital, and Dr. Arnoldo Morale performed a fascial repair with mesh.  She was progressed on her diet post operatively, and has been tolerating a diet and has good pain control. Her daughter has decided that she needs more care than the family can provide in this acute period, and a SNF was found for placement.   Consults: physical therapy  Significant Diagnostic Studies: CT with fascial dehiscence and intact skin  Treatments: IV hydration and surgery: fascial repair with mesh  Discharge Exam: Blood pressure 126/71, pulse 71, temperature 97.7 F (36.5 C), temperature source Axillary, resp. rate 14, height 5\' 6"  (1.676 m), weight 103 lb 8 oz (46.9 kg), SpO2 97 %. General appearance: alert, no distress and slightly confused but pleasant Resp: normal work breathing, clear to auscultation GI: soft, appropriately tender, midline with sutures in place, no erythema or drainage Extremities: warm, moves all extremites  Disposition: [place]   Discharge Instructions    Call MD for:  difficulty breathing, headache or visual disturbances   Complete by:  As directed    Call MD for:  extreme fatigue   Complete by:  As directed    Call MD  for:  hives   Complete by:  As directed    Call MD for:  persistant dizziness or light-headedness   Complete by:  As directed    Call MD for:  persistant nausea and vomiting   Complete by:  As directed    Call MD for:  redness, tenderness, or signs of infection (pain, swelling, redness, odor or green/yellow discharge around incision site)   Complete by:  As directed    Call MD for:  severe uncontrolled pain   Complete by:  As directed    Call MD for:  temperature >100.4   Complete by:  As directed    Diet - low sodium heart healthy   Complete by:  As directed    Discharge instructions   Complete by:  As directed    Keep incision clean and dry. Keep abdominal binder in place and dry dressing over gauze.   Increase activity slowly   Complete by:  As directed      Allergies as of 01/07/2017   No Known Allergies     Medication List    TAKE these medications   cloNIDine 0.1 MG tablet Commonly known as:  CATAPRES Take 0.1 mg by mouth 3 (three) times daily.   haloperidol 5 MG tablet Commonly known as:  HALDOL Take 5 mg by mouth at bedtime.   hydrALAZINE 10 MG tablet Commonly known as:  APRESOLINE Take 2 tablets (20 mg total) by mouth at bedtime. What changed:  how much to take   HYDROcodone-acetaminophen 7.5-325 mg/15 ml solution Commonly known  as:  HYCET Take 5 mLs by mouth every 6 (six) hours as needed for moderate pain or severe pain.   LORazepam 0.5 MG tablet Commonly known as:  ATIVAN Take 0.5 mg by mouth at bedtime as needed.   magnesium hydroxide 400 MG/5ML suspension Commonly known as:  MILK OF MAGNESIA Take 15 mLs by mouth daily.   minoxidil 2.5 MG tablet Commonly known as:  LONITEN Take 2 tablets (5 mg total) by mouth 2 (two) times daily.   mirtazapine 30 MG tablet Commonly known as:  REMERON Take 30 mg by mouth at bedtime.   pravastatin 20 MG tablet Commonly known as:  PRAVACHOL Take 20 mg by mouth at bedtime.   traZODone 100 MG tablet Commonly  known as:  DESYREL Take 200 mg by mouth at bedtime as needed for sleep.      Follow-up Information    Virl Cagey, MD Follow up in 2 week(s).   Specialty:  General Surgery Contact information: [address] [place] [address] [phone]           Signed: Virl Cagey 01/07/2017, 2:25 PM

## 2017-01-06 NOTE — Discharge Instructions (Signed)
Discharge Instructions: Shower per your regular routine. Do not submerge in bath.  Take tylenol and ibuprofen as needed for pain control, alternating every 4-6 hours.  Take Hycet for breakthrough pain. Take colace for constipation related to narcotic pain medication. Do not pick or remove the stitches. Abdominal binder and dressing over wound daily to protect from patient picking at the wound.   Exploratory Laparotomy, Adult, Care After Refer to this sheet in the next few weeks. These instructions provide you with information about caring for yourself after your procedure. Your health care provider may also give you more specific instructions. Your treatment has been planned according to current medical practices, but problems sometimes occur. Call your health care provider if you have any problems or questions after your procedure. What can I expect after the procedure? After your procedure, it is typical to have:  Abdominal soreness.  Fatigue.  A sore throat from tubes in your throat.  A lack of appetite.  Follow these instructions at home: Medicines  Take medicines only as directed by your health care provider.  Do not drive or operate heavy machinery while taking pain medicine. Incision care  There are many different ways to close and cover an incision, including stitches (sutures), skin glue, and adhesive strips. Follow your health care provider's instructions about: ? Incision care. ? Bandage (dressing) changes and removal. ? Incision closure removal.  Do not take showers or baths until your health care provider says that you can.  Check your incision area daily for signs of infection. Watch for: ? Redness. ? Tenderness. ? Swelling. ? Drainage. Activity  Do not lift anything that is heavier than 10 pounds (4.5 kg) until your health care provider says that it is safe.  Try to walk a little bit each day if your health care provider says that it is okay.  Ask your  health care provider when you can start to do your usual activities again, such as driving, going back to work, and having sex. Eating and drinking  You may eat what you usually eat. Include lots of whole grains, fruits, and vegetables in your diet. This will help to prevent constipation.  Drink enough fluid to keep your urine clear or pale yellow. General instructions  Keep all follow-up visits as directed by your health care provider. This is important. Contact a health care provider if:  You have a fever.  You have chills.  Your pain medicine is not helping.  You have constipation or diarrhea.  You have nausea or vomiting.  You have drainage, redness, swelling, or pain at your incision site. Get help right away if:  Your pain is getting worse.  It has been more than 3 days since you been able to have a bowel movement.  You have ongoing (persistent) vomiting.  The edges of your incision open up.  You have warmth, tenderness, and swelling in your calf.  You have trouble breathing.  You have chest pain. This information is not intended to replace advice given to you by your health care provider. Make sure you discuss any questions you have with your health care provider. Document Released: 09/12/2003 Document Revised: 07/06/2015 Document Reviewed: 09/15/2013 Elsevier Interactive Patient Education  2018 [employer].

## 2017-01-06 NOTE — NC FL2 (Signed)
[place] LEVEL OF CARE SCREENING TOOL     IDENTIFICATION  Patient Name: Brittney Wall Birthdate: Jan 14, 1946 Sex: female Admission Date (Current Location): 01/03/2017  [facility] and [place] Number:  [employer] and Address:  [place] [address], [place]      Provider Number: [id number]  Attending Physician Name and Address:  Aviva Signs, MD  Relative Name and Phone Number:       Current Level of Care: Hospital Recommended Level of Care: [place] Prior Approval Number:    Date Approved/Denied:   PASRR Number:    Discharge Plan: SNF    Current Diagnoses: Patient Active Problem List   Diagnosis Date Noted  . Fascial defect   . Wound dehiscence 01/03/2017  . Small bowel obstruction ([place]) 12/23/2016  . Hypokalemia 12/23/2016  . Obturator hernia   . Ischemic bowel disease ([place])   . Pneumothorax on right 09/07/2016  . Left adrenal mass ([place]) 09/06/2016  . Abnormal CT scan 09/06/2016  . Compression fracture of L5 lumbar vertebra ([place]) 09/06/2016  . Malignant hypertension 09/06/2016  . Ileus ([place]) 09/05/2016  . Chest pain 03/15/2015  . Hypertensive urgency 03/15/2015  . Schizophrenia ([place]) 03/15/2015  . Tobacco abuse 03/15/2015  . Dementia with behavioral disturbance 03/15/2015  . Protein-calorie malnutrition, severe ([place]) 03/15/2015  . Chest pain at rest 03/15/2015    Orientation RESPIRATION BLADDER Height & Weight     Self  Normal Incontinent Weight: 103 lb 8 oz (46.9 kg) Height:  5\' 6"  (167.6 cm)  BEHAVIORAL SYMPTOMS/MOOD NEUROLOGICAL BOWEL NUTRITION STATUS      Incontinent (Diet Soft)  AMBULATORY STATUS COMMUNICATION OF NEEDS Skin   Limited Assist Verbally Surgical wounds(Closed incision: abdomen)                       Personal Care Assistance Level of Assistance  Bathing, Feeding, Dressing Bathing Assistance: Maximum assistance Feeding assistance: Limited  assistance Dressing Assistance: Maximum assistance     Functional Limitations Info  Sight, Hearing, Speech Sight Info: Adequate Hearing Info: Adequate Speech Info: Adequate    SPECIAL CARE FACTORS FREQUENCY  PT (By licensed PT)     PT Frequency: 5x/week              Contractures Contractures Info: Not present    Additional Factors Info  Code Status, Psychotropic Code Status Info: Full    Psychotropic Info: Haldol, Ativan, Remeron, Desyrel         Current Medications (01/06/2017):  This is the current hospital active medication list Current Facility-Administered Medications  Medication Dose Route Frequency Provider Last Rate Last Dose  . acetaminophen (TYLENOL) tablet 650 mg  650 mg Oral Q6H PRN Aviva Signs, MD       Or  . acetaminophen (TYLENOL) suppository 650 mg  650 mg Rectal Q6H PRN Aviva Signs, MD      . cloNIDine (CATAPRES) tablet 0.1 mg  0.1 mg Oral TID Aviva Signs, MD   0.1 mg at 01/06/17 1034  . enoxaparin (LOVENOX) injection 30 mg  30 mg Subcutaneous Q24H Aviva Signs, MD   30 mg at 01/04/17 2109  . haloperidol (HALDOL) tablet 5 mg  5 mg Oral QHS Isla Pence, MD   5 mg at 01/05/17 2135  . haloperidol lactate (HALDOL) injection 2 mg  2 mg Intravenous Q6H PRN Reubin Milan, MD      . hydrALAZINE (APRESOLINE) tablet 10 mg  10 mg Oral  Marius Ditch, MD   10 mg at 01/05/17 2135  . HYDROcodone-acetaminophen (HYCET) 7.5-325 mg/15 ml solution 5 mL  5 mL Oral Q6H PRN Virl Cagey, MD      . ketorolac (TORADOL) 30 MG/ML injection 15 mg  15 mg Intravenous Q6H PRN Aviva Signs, MD      . lactated ringers infusion   Intravenous Continuous Aviva Signs, MD 10 mL/hr at 01/06/17 0300    . LORazepam (ATIVAN) tablet 0.5 mg  0.5 mg Oral Q6H PRN Aviva Signs, MD   0.5 mg at 01/06/17 1037  . magnesium hydroxide (MILK OF MAGNESIA) suspension 15 mL  15 mL Oral TID PC Aviva Signs, MD   15 mL at 01/06/17 1336  . megestrol (MEGACE) 400 MG/10ML  suspension 800 mg  800 mg Oral Daily Aviva Signs, MD   800 mg at 01/06/17 1034  . ondansetron (ZOFRAN-ODT) disintegrating tablet 4 mg  4 mg Oral Q6H PRN Aviva Signs, MD       Or  . ondansetron [facility]) injection 4 mg  4 mg Intravenous Q6H PRN Aviva Signs, MD      . phosphorus (K PHOS NEUTRAL) tablet 500 mg  500 mg Oral Q4H while awake Aviva Signs, MD   500 mg at 01/06/17 1335  . simethicone (MYLICON) chewable tablet 40 mg  40 mg Oral Q6H PRN Aviva Signs, MD      . traZODone (DESYREL) tablet 200 mg  200 mg Oral QHS PRN Isla Pence, MD   200 mg at 01/05/17 2135  . vancomycin (VANCOCIN) IVPB 750 mg/150 ml premix  750 mg Intravenous Q24H Aviva Signs, MD   Stopped at 01/06/17 5945     Discharge Medications: Please see discharge summary for a list of discharge medications.  Relevant Imaging Results:  Relevant Lab Results:   Additional Information SSN [id number] Clydene Pugh, LCSW

## 2017-01-06 NOTE — Progress Notes (Signed)
[facility] Progress Note  2 Days Post-Op  Subjective: No major issues. Daughter wanting placement.   Objective: Vital signs in last 24 hours: Temp:  [97.6 F (36.4 C)-98.1 F (36.7 C)] 97.8 F (36.6 C) (11/26 1526) Pulse Rate:  [69-83] 69 (11/26 1526) Resp:  [18-19] 19 (11/26 1526) BP: (138-171)/(62-69) 138/62 (11/26 1526) SpO2:  [93 %-100 %] 93 % (11/26 1649)    Intake/Output from previous day: 11/25 0701 - 11/26 0700 In: 1845.5 [P.O.:240; I.V.:1455.5; IV Piggyback:150] Out: -  Intake/Output this shift: No intake/output data recorded.  General appearance: alert and no distress Resp: normal work breathing GI: soft, nondistended, appropriately tender, no rebound or guarding, midline with stitches intact, no erythema or drainage Extremities: warm, moves all extremities, no edema  Lab Results:  Recent Labs    01/05/17 0752 01/06/17 0920  WBC 11.7* 10.6*  HGB 9.9* 12.8  HCT 30.3* 40.5  PLT 301 316   BMET Recent Labs    01/05/17 0752 01/06/17 0920  NA 136 137  K 4.3 4.6  CL 104 103  CO2 28 27  GLUCOSE 91 103*  BUN 10 11  CREATININE 0.61 0.61  CALCIUM 8.1* 8.4*   PT/INR No results for input(s): LABPROT, INR in the last 72 hours.  Studies/Results: No results found.  Anti-infectives: Anti-infectives (From admission, onward)   Start     Dose/Rate Route Frequency Ordered Stop   01/05/17 0600  vancomycin (VANCOCIN) IVPB 750 mg/150 ml premix     750 mg 150 mL/hr over 60 Minutes Intravenous Every 24 hours 01/04/17 1041     01/04/17 1100  vancomycin (VANCOCIN) IVPB 1000 mg/200 mL premix     1,000 mg 200 mL/hr over 60 Minutes Intravenous  Once 01/04/17 1041 01/04/17 1305   01/03/17 2000  ceFAZolin (ANCEF) IVPB 1 g/50 mL premix  Status:  Discontinued     1 g 100 mL/hr over 30 Minutes Intravenous Every 8 hours 01/03/17 1908 01/04/17 1035      Assessment/Plan: Ms. Lick is a 71 yo with dementia who was taken back to the OR for fascia  dehiscence and had a repair with mesh.   -PRN for pain, hycet ordered, fentanyl canceled -Soft diet -Placement pending, PT ordered -Mag and Phos improved    LOS: 2 days    Virl Cagey 01/06/2017

## 2017-01-06 NOTE — Evaluation (Signed)
Physical Therapy Evaluation Patient Details Name: SAFAA STINGLEY MRN: [id number] DOB: 02/13/1945 Today's Date: 01/06/2017   History of Present Illness  Patient is a 71 year old black female with dementia and bipolar disorder who is status post partial small bowel resection for an incarcerated obturator hernia on 12/23/2016 who has been picking her wound at home and now has some soft tissue sticking out of the upper portion of the incision.  She was seen in the emergency room and was noted to have a fascial dehiscence along the superior aspect of the surgical incision.  Family reports that she has had no fevers and has been eating fine.  No nausea or vomiting have been noted.  She presented back to the emergency room with a new finding on her incision.  She was admitted to the hospital for IV antibiotics and subsequent closure of the wound  Clinical Impression  Pt was seen by physical therapy after her initial admission on 12/23/16.  At that time the patient was able to ambulate with 2 hand held assist x 50 feet.  Currently pt was only able to sit on the side of the bed for three minutes after which the patient started to lie back down.  The pt was non verbal throughout the treatment and family was not present for questioning.  The therapist attempted to bring the patient from sit to stand unsuccessfully.  This is a significant change of functional status and although there is a history of dementia this patient was walking with assistance less than two weeks ago therefore I am recommending SNF placement to attempt to return this pt to her prior level of function.    Follow Up Recommendations SNF    Equipment Recommendations    none    Recommendations for Other Services   none     Precautions / Restrictions Precautions Precautions: Fall Restrictions Weight Bearing Restrictions: No      Mobility  Bed Mobility Overal bed mobility: Needs Assistance Bed Mobility: Supine to Sit;Sit to  Supine     Supine to sit: Max assist Sit to supine: Max assist      Transfers Overall transfer level: Needs assistance Equipment used: (unable to safely at this time ) Transfers: (Attempted lateral scoot but pt would not assist )              Ambulation/Gait  unable                      Balance  sitting balance Fair.                                             Pertinent Vitals/Pain Faces Pain Scale: No hurt    Home Living Family/patient expects to be discharged to:: Private residence Living Arrangements: Children Available Help at Discharge: Family(her sister and daughter ) Type of Home: House Home Access: Level entry     Home Layout: One level Home Equipment: Wheelchair - manual;Bedside commode;Shower seat;Hospital bed      Prior Function Level of Independence: Needs assistance   Gait / Transfers Assistance Needed: patient ambulates household distances without assistive device with supervision  ADL's / Homemaking Assistance Needed: assisted by family           Extremity/Trunk Assessment        Lower Extremity Assessment Lower Extremity Assessment: Difficult to assess due to  impaired cognition       Communication   Communication: No difficulties  Cognition Arousal/Alertness: Lethargic   Overall Cognitive Status: Difficult to assess                                               Assessment/Plan    PT Assessment Patient needs continued PT services  PT Problem List Decreased strength;Decreased balance;Decreased mobility;Decreased activity tolerance       PT Treatment Interventions Therapeutic activities;Therapeutic exercise;Gait training;Functional mobility training    PT Goals (Current goals can be found in the Care Plan section)       Frequency Min 3X/week   Barriers to discharge    decreased mobility from time of admission.        AM-PAC PT "6 Clicks" Daily Activity  Outcome  Measure Difficulty turning over in bed (including adjusting bedclothes, sheets and blankets)?: Unable Difficulty moving from lying on back to sitting on the side of the bed? : Unable Difficulty sitting down on and standing up from a chair with arms (e.g., wheelchair, bedside commode, etc,.)?: Unable Help needed moving to and from a bed to chair (including a wheelchair)?: Total Help needed walking in hospital room?: Total Help needed climbing 3-5 steps with a railing? : Total 6 Click Score: 6    End of Session Equipment Utilized During Treatment: Gait belt Activity Tolerance: Treatment limited secondary to agitation(PT pinching therapist when she attempted to come sit to stand ) Patient left: in bed;with nursing/sitter in room   PT Visit Diagnosis: Unsteadiness on feet (R26.81);Muscle weakness (generalized) (M62.81);Difficulty in walking, not elsewhere classified (R26.2)    Time: 4627-0350 PT Time Calculation (min) (ACUTE ONLY): 34 min   Charges:   PT Evaluation $PT Eval Low Complexity: 1 Low     PT G Codes:   PT G-Codes **NOT FOR INPATIENT CLASS** Functional Assessment Tool Used: AM-PAC 6 Clicks Basic Mobility Functional Limitation: Mobility: Walking and moving around Mobility: Walking and Moving Around Current Status (K9381): 100 percent impaired, limited or restricted Mobility: Walking and Moving Around Goal Status (W2993): 100 percent impaired, limited or restricted Mobility: Walking and Moving Around Discharge Status (Z1696): 100 percent impaired, limited or restricted   Rayetta Humphrey, PT CLT [phone] 01/06/2017, 3:15 PM

## 2017-01-06 NOTE — Care Management Important Message (Signed)
Important Message  Patient Details  Name: Brittney Wall MRN: [id number] Date of Birth: 12-Jun-1945   Medicare Important Message Given:  Yes    Sherald Barge, RN 01/06/2017, 2:12 PM

## 2017-01-07 ENCOUNTER — Ambulatory Visit: Payer: Medicare Other | Admitting: General Surgery

## 2017-01-07 DIAGNOSIS — F209 Schizophrenia, unspecified: Secondary | ICD-10-CM | POA: Diagnosis not present

## 2017-01-07 DIAGNOSIS — R1312 Dysphagia, oropharyngeal phase: Secondary | ICD-10-CM | POA: Diagnosis not present

## 2017-01-07 DIAGNOSIS — E43 Unspecified severe protein-calorie malnutrition: Secondary | ICD-10-CM | POA: Diagnosis not present

## 2017-01-07 DIAGNOSIS — M629 Disorder of muscle, unspecified: Secondary | ICD-10-CM | POA: Diagnosis not present

## 2017-01-07 DIAGNOSIS — F0391 Unspecified dementia with behavioral disturbance: Secondary | ICD-10-CM | POA: Diagnosis not present

## 2017-01-07 DIAGNOSIS — I10 Essential (primary) hypertension: Secondary | ICD-10-CM | POA: Diagnosis not present

## 2017-01-07 DIAGNOSIS — R079 Chest pain, unspecified: Secondary | ICD-10-CM | POA: Diagnosis not present

## 2017-01-07 DIAGNOSIS — E876 Hypokalemia: Secondary | ICD-10-CM | POA: Diagnosis not present

## 2017-01-07 DIAGNOSIS — K56699 Other intestinal obstruction unspecified as to partial versus complete obstruction: Secondary | ICD-10-CM | POA: Diagnosis not present

## 2017-01-07 DIAGNOSIS — J019 Acute sinusitis, unspecified: Secondary | ICD-10-CM | POA: Diagnosis not present

## 2017-01-07 DIAGNOSIS — K567 Ileus, unspecified: Secondary | ICD-10-CM | POA: Diagnosis not present

## 2017-01-07 DIAGNOSIS — T8131XA Disruption of external operation (surgical) wound, not elsewhere classified, initial encounter: Secondary | ICD-10-CM | POA: Diagnosis not present

## 2017-01-07 DIAGNOSIS — R41841 Cognitive communication deficit: Secondary | ICD-10-CM | POA: Diagnosis not present

## 2017-01-07 DIAGNOSIS — T8131XD Disruption of external operation (surgical) wound, not elsewhere classified, subsequent encounter: Secondary | ICD-10-CM | POA: Diagnosis not present

## 2017-01-07 DIAGNOSIS — R2681 Unsteadiness on feet: Secondary | ICD-10-CM | POA: Diagnosis not present

## 2017-01-07 DIAGNOSIS — M6281 Muscle weakness (generalized): Secondary | ICD-10-CM | POA: Diagnosis not present

## 2017-01-07 DIAGNOSIS — R52 Pain, unspecified: Secondary | ICD-10-CM | POA: Diagnosis not present

## 2017-01-07 DIAGNOSIS — K559 Vascular disorder of intestine, unspecified: Secondary | ICD-10-CM | POA: Diagnosis not present

## 2017-01-07 LAB — PHOSPHORUS: PHOSPHORUS: 3 mg/dL (ref 2.5–4.6)

## 2017-01-07 NOTE — Clinical Social Work Placement (Signed)
   CLINICAL SOCIAL WORK PLACEMENT  NOTE  Date:  01/07/2017  Patient Details  Name: Brittney Wall MRN: [id number] Date of Birth: 08/07/1945  Clinical Social Work is seeking post-discharge placement for this patient at the [place] level of care (*CSW will initial, date and re-position this form in  chart as items are completed):  Yes   Patient/family provided with [facility] Work Department's list of facilities offering this level of care within the geographic area requested by the patient (or if unable, by the patient's family).  Yes   Patient/family informed of their freedom to choose among providers that offer the needed level of care, that participate in Medicare, Medicaid or managed care program needed by the patient, have an available bed and are willing to accept the patient.  Yes   Patient/family informed of [facility] ownership interest in [facility] and [facility], as well as of the fact that they are under no obligation to receive care at these facilities.  PASRR submitted to EDS on 01/06/17     PASRR number received on 01/07/17     Existing PASRR number confirmed on       FL2 transmitted to all facilities in geographic area requested by pt/family on 01/06/17     FL2 transmitted to all facilities within larger geographic area on       Patient informed that his/her managed care company has contracts with or will negotiate with certain facilities, including the following:        Yes   Patient/family informed of bed offers received.  Patient chooses bed at [facility]     Physician recommends and patient chooses bed at      Patient to be transferred to [facility] on 01/07/17.  Patient to be transferred to facility by [facility]     Patient family notified on 01/07/17 of transfer.  Name of family member notified:  Thana Farr, daughter,      PHYSICIAN       Additional Comment:  Facility notified and  discharge clinicals sent. LCSW signing off.   _______________________________________________ Ihor Gully, LCSW 01/07/2017, 3:23 PM

## 2017-01-07 NOTE — Care Management (Signed)
Pt coming from home, had referral for [facility] through [facility], they had not yet admitted pt to their services. Pt now discharging to SNF. CM has updated Alex Gardener, [facility] rep on DC plan. CSW making SNF arrangements.

## 2017-01-07 NOTE — Progress Notes (Signed)
Physical Therapy Treatment Patient Details Name: Brittney Wall MRN: [id number] DOB: Oct 10, 1945 Today's Date: 01/07/2017    History of Present Illness Patient is a 71 year old black female with dementia and bipolar disorder who is status post partial small bowel resection for an incarcerated obturator hernia on 12/23/2016 who has been picking her wound at home and now has some soft tissue sticking out of the upper portion of the incision.  She was seen in the emergency room and was noted to have a fascial dehiscence along the superior aspect of the surgical incision.  Family reports that she has had no fevers and has been eating fine.  No nausea or vomiting have been noted.  She presented back to the emergency room with a new finding on her incision.  She was admitted to the hospital for IV antibiotics and subsequent closure of the wound    PT Comments    Pt remains confused and combative at times.  Difficulty to get to follow instructions and impulsive with mobilty.  Pt requires mod-max assist with all transfers and gait.  Able to get patient to ambulate to bathroom this session but with poor mechanics, forward flexed and unsteady.  Pt tends to grasp and hold onto objects preventing motion.  Pt returned to bed per nursing request as she is a fall risk.     Follow Up Recommendations        Equipment Recommendations       Recommendations for Other Services       Precautions / Restrictions Precautions Precautions: Fall Restrictions Weight Bearing Restrictions: No    Mobility  Bed Mobility Overal bed mobility: Needs Assistance Bed Mobility: Supine to Sit;Sit to Supine;Sit to Sidelying     Supine to sit: Min assist;Mod assist Sit to supine: Min assist;Mod assist Sit to sidelying: Min assist General bed mobility comments: uses siderail, however sometimes will not let go of railings or follow instructions correctly.  Transfers Overall transfer level: Needs assistance Equipment  used: 2 person hand held assist Transfers: Stand Pivot Transfers   Stand pivot transfers: Mod assist;Max assist       General transfer comment: Pt remains forward flexed and with knees extended.  Difficult to get to follow instructions and impulsive  Ambulation/Gait Ambulation/Gait assistance: Mod assist;Max assist Ambulation Distance (Feet): 10 Feet Assistive device: 2 person hand held assist Gait Pattern/deviations: Decreased step length - left;Decreased step length - right;Decreased stride length;Trunk flexed     General Gait Details: required 2 person assist mostly for safety due to patient's lack of awareness for safety, demonstrates labored cadence with flexed trunk, and stiff in the knees.   Stairs            Wheelchair Mobility    Modified Rankin (Stroke Patients Only)       Balance                                            Cognition Arousal/Alertness: Awake/alert Behavior During Therapy: Agitated;Impulsive;Restless Overall Cognitive Status: History of cognitive impairments - at baseline                                        Exercises      General Comments        Pertinent Vitals/Pain Pain Assessment:  No/denies pain    Home Living                      Prior Function            PT Goals (current goals can now be found in the care plan section)      Frequency           PT Plan      Co-evaluation              AM-PAC PT "6 Clicks" Daily Activity  Outcome Measure                   End of Session               Time: 0930-1005 PT Time Calculation (min) (ACUTE ONLY): 35 min  Charges:  $Gait Training: 8-22 mins $Therapeutic Activity: 8-22 mins                    G CodesTeena Wall, PTA/CLT [phone]    Brittney Wall B 01/07/2017, 11:53 AM

## 2017-01-07 NOTE — Progress Notes (Addendum)
Discharged to [facility] in stable condition by [facility]. Family at bedside. Report given to Riley Lam, RN at [facility].

## 2017-01-08 DIAGNOSIS — J019 Acute sinusitis, unspecified: Secondary | ICD-10-CM | POA: Diagnosis not present

## 2017-01-08 DIAGNOSIS — F209 Schizophrenia, unspecified: Secondary | ICD-10-CM | POA: Diagnosis not present

## 2017-01-08 DIAGNOSIS — T8131XD Disruption of external operation (surgical) wound, not elsewhere classified, subsequent encounter: Secondary | ICD-10-CM | POA: Diagnosis not present

## 2017-01-08 DIAGNOSIS — I10 Essential (primary) hypertension: Secondary | ICD-10-CM | POA: Diagnosis not present

## 2017-01-15 DIAGNOSIS — F209 Schizophrenia, unspecified: Secondary | ICD-10-CM | POA: Diagnosis not present

## 2017-01-15 DIAGNOSIS — F0391 Unspecified dementia with behavioral disturbance: Secondary | ICD-10-CM | POA: Diagnosis not present

## 2017-01-15 DIAGNOSIS — I10 Essential (primary) hypertension: Secondary | ICD-10-CM | POA: Diagnosis not present

## 2017-01-15 DIAGNOSIS — T8131XD Disruption of external operation (surgical) wound, not elsewhere classified, subsequent encounter: Secondary | ICD-10-CM | POA: Diagnosis not present

## 2017-01-16 DIAGNOSIS — F209 Schizophrenia, unspecified: Secondary | ICD-10-CM | POA: Diagnosis not present

## 2017-01-16 DIAGNOSIS — K559 Vascular disorder of intestine, unspecified: Secondary | ICD-10-CM | POA: Diagnosis not present

## 2017-01-16 DIAGNOSIS — I10 Essential (primary) hypertension: Secondary | ICD-10-CM | POA: Diagnosis not present

## 2017-01-16 DIAGNOSIS — T8131XD Disruption of external operation (surgical) wound, not elsewhere classified, subsequent encounter: Secondary | ICD-10-CM | POA: Diagnosis not present

## 2017-01-21 ENCOUNTER — Ambulatory Visit: Payer: Medicare Other | Admitting: General Surgery

## 2017-01-23 ENCOUNTER — Encounter: Payer: Self-pay | Admitting: General Surgery

## 2017-01-23 ENCOUNTER — Ambulatory Visit (INDEPENDENT_AMBULATORY_CARE_PROVIDER_SITE_OTHER): Payer: Self-pay | Admitting: General Surgery

## 2017-01-23 VITALS — Temp 98.7°F | Resp 18 | Ht 68.0 in | Wt 82.0 lb

## 2017-01-23 DIAGNOSIS — M629 Disorder of muscle, unspecified: Secondary | ICD-10-CM

## 2017-01-23 DIAGNOSIS — K458 Other specified abdominal hernia without obstruction or gangrene: Secondary | ICD-10-CM

## 2017-01-23 DIAGNOSIS — K559 Vascular disorder of intestine, unspecified: Secondary | ICD-10-CM

## 2017-01-23 DIAGNOSIS — K56609 Unspecified intestinal obstruction, unspecified as to partial versus complete obstruction: Secondary | ICD-10-CM

## 2017-01-23 NOTE — Progress Notes (Signed)
[facility] Note   HPI:  71 y.o. Female presents to clinic for post-op follow-up evaluation after an exploratory laparotomy with small bowel resection for ischemic small bowel that was incarcerated and strangulated in an obturator hernia done 12/23/2016 and subsequent reexploration and fascial closure for dehiscence of the wound 01/04/2017. She was discharged to a SNF and has been with her sister for the last 2 weeks. The daughter is with her today and reports good intake and appetite and patient is having BMs regularly. The patient has dementia and is aggressive.   Review of Systems:  No fevers or chills + BM and diet  All other review of systems: otherwise negative   Pathology: Diagnosis Small intestine, resection - SMALL INTESTINE WITH SEROSITIS, ISCHEMIC CHANGES, ACUTE AND CHRONIC INFLAMMATION - NO MALIGNANCY IDENTIFIED  Vital Signs: Patient refused BP and HR evaluation.  Temp 98.7 F (37.1 C)   Resp 18   Ht 5\' 8"  (1.727 m)   Wt 82 lb (37.2 kg)   BMI 12.47 kg/m    Physical Exam:  Physical Exam  Constitutional: She appears malnourished. She appears cachectic. No distress.  HENT:  Head: Normocephalic.  Eyes: Pupils are equal, round, and reactive to light.  Pulmonary/Chest: Effort normal.  Abdominal: Soft. She exhibits no distension. There is no tenderness.  Well healed midline and sutures removed, no erythema or drainage, steri strips placed, no hernia defect   Musculoskeletal: Normal range of motion.  Neurological: She is alert.  Skin: Skin is warm and dry.  Vitals reviewed.   Laboratory studies: None   Imaging:  None   Assessment:  71 y.o. yo Female with dementia who is s/p Ex with SBR for obturator hernia causing ischemic small bowel. The hernia defect was not repaired due to the size of the hernia. She was lateral taken back for a fascia dehiscence requiring reclosure. She has lost more weight but her daughter reports a good appetite and intake.  She is having BMs and no pain complaints.   Plan:  - Continue to encourage meals and diet   - Follow up as needed  - Steri strips will fall off on their own, shower/ bathe per regular routine    All of the above recommendations were discussed with the daughter and all questions were answered to her expressed satisfaction.  Curlene Labrum, MD [facility] [address] [place], [place] [address] [phone] (office)

## 2017-01-28 DIAGNOSIS — Z87891 Personal history of nicotine dependence: Secondary | ICD-10-CM | POA: Diagnosis not present

## 2017-01-28 DIAGNOSIS — F0391 Unspecified dementia with behavioral disturbance: Secondary | ICD-10-CM | POA: Diagnosis not present

## 2017-01-28 DIAGNOSIS — F319 Bipolar disorder, unspecified: Secondary | ICD-10-CM | POA: Diagnosis not present

## 2017-01-28 DIAGNOSIS — F209 Schizophrenia, unspecified: Secondary | ICD-10-CM | POA: Diagnosis not present

## 2017-01-28 DIAGNOSIS — J449 Chronic obstructive pulmonary disease, unspecified: Secondary | ICD-10-CM | POA: Diagnosis not present

## 2017-01-28 DIAGNOSIS — T8131XA Disruption of external operation (surgical) wound, not elsewhere classified, initial encounter: Secondary | ICD-10-CM | POA: Diagnosis not present

## 2017-01-28 DIAGNOSIS — I10 Essential (primary) hypertension: Secondary | ICD-10-CM | POA: Diagnosis not present

## 2017-01-31 DIAGNOSIS — F319 Bipolar disorder, unspecified: Secondary | ICD-10-CM | POA: Diagnosis not present

## 2017-01-31 DIAGNOSIS — T8131XA Disruption of external operation (surgical) wound, not elsewhere classified, initial encounter: Secondary | ICD-10-CM | POA: Diagnosis not present

## 2017-01-31 DIAGNOSIS — F0391 Unspecified dementia with behavioral disturbance: Secondary | ICD-10-CM | POA: Diagnosis not present

## 2017-01-31 DIAGNOSIS — I10 Essential (primary) hypertension: Secondary | ICD-10-CM | POA: Diagnosis not present

## 2017-01-31 DIAGNOSIS — F209 Schizophrenia, unspecified: Secondary | ICD-10-CM | POA: Diagnosis not present

## 2017-01-31 DIAGNOSIS — J449 Chronic obstructive pulmonary disease, unspecified: Secondary | ICD-10-CM | POA: Diagnosis not present

## 2017-02-02 DIAGNOSIS — F319 Bipolar disorder, unspecified: Secondary | ICD-10-CM | POA: Diagnosis not present

## 2017-02-02 DIAGNOSIS — I10 Essential (primary) hypertension: Secondary | ICD-10-CM | POA: Diagnosis not present

## 2017-02-02 DIAGNOSIS — J449 Chronic obstructive pulmonary disease, unspecified: Secondary | ICD-10-CM | POA: Diagnosis not present

## 2017-02-02 DIAGNOSIS — F0391 Unspecified dementia with behavioral disturbance: Secondary | ICD-10-CM | POA: Diagnosis not present

## 2017-02-02 DIAGNOSIS — F209 Schizophrenia, unspecified: Secondary | ICD-10-CM | POA: Diagnosis not present

## 2017-02-02 DIAGNOSIS — T8131XA Disruption of external operation (surgical) wound, not elsewhere classified, initial encounter: Secondary | ICD-10-CM | POA: Diagnosis not present

## 2017-02-06 DIAGNOSIS — F0391 Unspecified dementia with behavioral disturbance: Secondary | ICD-10-CM | POA: Diagnosis not present

## 2017-02-06 DIAGNOSIS — R634 Abnormal weight loss: Secondary | ICD-10-CM | POA: Diagnosis not present

## 2017-02-06 DIAGNOSIS — J449 Chronic obstructive pulmonary disease, unspecified: Secondary | ICD-10-CM | POA: Diagnosis not present

## 2017-02-13 DIAGNOSIS — J449 Chronic obstructive pulmonary disease, unspecified: Secondary | ICD-10-CM | POA: Diagnosis not present

## 2017-02-13 DIAGNOSIS — T8131XA Disruption of external operation (surgical) wound, not elsewhere classified, initial encounter: Secondary | ICD-10-CM | POA: Diagnosis not present

## 2017-02-13 DIAGNOSIS — I10 Essential (primary) hypertension: Secondary | ICD-10-CM | POA: Diagnosis not present

## 2017-02-13 DIAGNOSIS — F0391 Unspecified dementia with behavioral disturbance: Secondary | ICD-10-CM | POA: Diagnosis not present

## 2017-02-13 DIAGNOSIS — F209 Schizophrenia, unspecified: Secondary | ICD-10-CM | POA: Diagnosis not present

## 2017-02-13 DIAGNOSIS — F319 Bipolar disorder, unspecified: Secondary | ICD-10-CM | POA: Diagnosis not present

## 2017-02-14 DIAGNOSIS — F209 Schizophrenia, unspecified: Secondary | ICD-10-CM | POA: Diagnosis not present

## 2017-02-14 DIAGNOSIS — J449 Chronic obstructive pulmonary disease, unspecified: Secondary | ICD-10-CM | POA: Diagnosis not present

## 2017-02-14 DIAGNOSIS — I10 Essential (primary) hypertension: Secondary | ICD-10-CM | POA: Diagnosis not present

## 2017-02-14 DIAGNOSIS — F319 Bipolar disorder, unspecified: Secondary | ICD-10-CM | POA: Diagnosis not present

## 2017-02-14 DIAGNOSIS — F0391 Unspecified dementia with behavioral disturbance: Secondary | ICD-10-CM | POA: Diagnosis not present

## 2017-02-14 DIAGNOSIS — T8131XA Disruption of external operation (surgical) wound, not elsewhere classified, initial encounter: Secondary | ICD-10-CM | POA: Diagnosis not present

## 2017-02-18 DIAGNOSIS — F319 Bipolar disorder, unspecified: Secondary | ICD-10-CM | POA: Diagnosis not present

## 2017-02-18 DIAGNOSIS — J449 Chronic obstructive pulmonary disease, unspecified: Secondary | ICD-10-CM | POA: Diagnosis not present

## 2017-02-18 DIAGNOSIS — F209 Schizophrenia, unspecified: Secondary | ICD-10-CM | POA: Diagnosis not present

## 2017-02-18 DIAGNOSIS — F0391 Unspecified dementia with behavioral disturbance: Secondary | ICD-10-CM | POA: Diagnosis not present

## 2017-02-18 DIAGNOSIS — T8131XA Disruption of external operation (surgical) wound, not elsewhere classified, initial encounter: Secondary | ICD-10-CM | POA: Diagnosis not present

## 2017-02-18 DIAGNOSIS — I10 Essential (primary) hypertension: Secondary | ICD-10-CM | POA: Diagnosis not present

## 2017-02-25 DIAGNOSIS — I10 Essential (primary) hypertension: Secondary | ICD-10-CM | POA: Diagnosis not present

## 2017-02-25 DIAGNOSIS — F209 Schizophrenia, unspecified: Secondary | ICD-10-CM | POA: Diagnosis not present

## 2017-02-25 DIAGNOSIS — T8131XA Disruption of external operation (surgical) wound, not elsewhere classified, initial encounter: Secondary | ICD-10-CM | POA: Diagnosis not present

## 2017-02-25 DIAGNOSIS — F319 Bipolar disorder, unspecified: Secondary | ICD-10-CM | POA: Diagnosis not present

## 2017-02-25 DIAGNOSIS — J449 Chronic obstructive pulmonary disease, unspecified: Secondary | ICD-10-CM | POA: Diagnosis not present

## 2017-02-25 DIAGNOSIS — F0391 Unspecified dementia with behavioral disturbance: Secondary | ICD-10-CM | POA: Diagnosis not present

## 2017-02-27 DIAGNOSIS — T8131XA Disruption of external operation (surgical) wound, not elsewhere classified, initial encounter: Secondary | ICD-10-CM | POA: Diagnosis not present

## 2017-02-27 DIAGNOSIS — F0391 Unspecified dementia with behavioral disturbance: Secondary | ICD-10-CM | POA: Diagnosis not present

## 2017-02-27 DIAGNOSIS — I10 Essential (primary) hypertension: Secondary | ICD-10-CM | POA: Diagnosis not present

## 2017-02-27 DIAGNOSIS — F209 Schizophrenia, unspecified: Secondary | ICD-10-CM | POA: Diagnosis not present

## 2017-02-27 DIAGNOSIS — F319 Bipolar disorder, unspecified: Secondary | ICD-10-CM | POA: Diagnosis not present

## 2017-02-27 DIAGNOSIS — J449 Chronic obstructive pulmonary disease, unspecified: Secondary | ICD-10-CM | POA: Diagnosis not present

## 2017-03-04 DIAGNOSIS — T8131XA Disruption of external operation (surgical) wound, not elsewhere classified, initial encounter: Secondary | ICD-10-CM | POA: Diagnosis not present

## 2017-03-04 DIAGNOSIS — F319 Bipolar disorder, unspecified: Secondary | ICD-10-CM | POA: Diagnosis not present

## 2017-03-04 DIAGNOSIS — F0391 Unspecified dementia with behavioral disturbance: Secondary | ICD-10-CM | POA: Diagnosis not present

## 2017-03-04 DIAGNOSIS — J449 Chronic obstructive pulmonary disease, unspecified: Secondary | ICD-10-CM | POA: Diagnosis not present

## 2017-03-04 DIAGNOSIS — I10 Essential (primary) hypertension: Secondary | ICD-10-CM | POA: Diagnosis not present

## 2017-03-04 DIAGNOSIS — F209 Schizophrenia, unspecified: Secondary | ICD-10-CM | POA: Diagnosis not present

## 2017-03-11 DIAGNOSIS — J449 Chronic obstructive pulmonary disease, unspecified: Secondary | ICD-10-CM | POA: Diagnosis not present

## 2017-03-11 DIAGNOSIS — F0391 Unspecified dementia with behavioral disturbance: Secondary | ICD-10-CM | POA: Diagnosis not present

## 2017-03-11 DIAGNOSIS — I10 Essential (primary) hypertension: Secondary | ICD-10-CM | POA: Diagnosis not present

## 2017-03-11 DIAGNOSIS — T8131XA Disruption of external operation (surgical) wound, not elsewhere classified, initial encounter: Secondary | ICD-10-CM | POA: Diagnosis not present

## 2017-03-11 DIAGNOSIS — F209 Schizophrenia, unspecified: Secondary | ICD-10-CM | POA: Diagnosis not present

## 2017-03-11 DIAGNOSIS — F319 Bipolar disorder, unspecified: Secondary | ICD-10-CM | POA: Diagnosis not present

## 2017-03-13 ENCOUNTER — Other Ambulatory Visit (HOSPITAL_COMMUNITY)
Admission: AD | Admit: 2017-03-13 | Discharge: 2017-03-13 | Disposition: A | Discharge status: Home / Self Care | Payer: Medicare Other | Source: Skilled Nursing Facility | Attending: Family Medicine | Admitting: Family Medicine

## 2017-03-13 DIAGNOSIS — J449 Chronic obstructive pulmonary disease, unspecified: Secondary | ICD-10-CM | POA: Diagnosis not present

## 2017-03-13 DIAGNOSIS — N390 Urinary tract infection, site not specified: Secondary | ICD-10-CM | POA: Diagnosis not present

## 2017-03-13 DIAGNOSIS — T8131XA Disruption of external operation (surgical) wound, not elsewhere classified, initial encounter: Secondary | ICD-10-CM | POA: Diagnosis not present

## 2017-03-13 DIAGNOSIS — F209 Schizophrenia, unspecified: Secondary | ICD-10-CM | POA: Diagnosis not present

## 2017-03-13 DIAGNOSIS — F319 Bipolar disorder, unspecified: Secondary | ICD-10-CM | POA: Diagnosis not present

## 2017-03-13 DIAGNOSIS — F0391 Unspecified dementia with behavioral disturbance: Secondary | ICD-10-CM | POA: Diagnosis not present

## 2017-03-13 DIAGNOSIS — I10 Essential (primary) hypertension: Secondary | ICD-10-CM | POA: Diagnosis not present

## 2017-03-13 LAB — URINALYSIS, ROUTINE W REFLEX MICROSCOPIC
BILIRUBIN URINE: NEGATIVE
Bacteria, UA: NONE SEEN
Glucose, UA: NEGATIVE mg/dL
HGB URINE DIPSTICK: NEGATIVE
Ketones, ur: NEGATIVE mg/dL
Nitrite: NEGATIVE
PH: 5 (ref 5.0–8.0)
Protein, ur: NEGATIVE mg/dL
SPECIFIC GRAVITY, URINE: 1.024 (ref 1.005–1.030)

## 2017-03-15 LAB — URINE CULTURE: Culture: 40000 — AB

## 2017-03-19 DIAGNOSIS — F319 Bipolar disorder, unspecified: Secondary | ICD-10-CM | POA: Diagnosis not present

## 2017-03-19 DIAGNOSIS — T8131XA Disruption of external operation (surgical) wound, not elsewhere classified, initial encounter: Secondary | ICD-10-CM | POA: Diagnosis not present

## 2017-03-19 DIAGNOSIS — J449 Chronic obstructive pulmonary disease, unspecified: Secondary | ICD-10-CM | POA: Diagnosis not present

## 2017-03-19 DIAGNOSIS — F209 Schizophrenia, unspecified: Secondary | ICD-10-CM | POA: Diagnosis not present

## 2017-03-19 DIAGNOSIS — F0391 Unspecified dementia with behavioral disturbance: Secondary | ICD-10-CM | POA: Diagnosis not present

## 2017-03-19 DIAGNOSIS — I10 Essential (primary) hypertension: Secondary | ICD-10-CM | POA: Diagnosis not present

## 2017-03-20 DIAGNOSIS — F209 Schizophrenia, unspecified: Secondary | ICD-10-CM | POA: Diagnosis not present

## 2017-03-25 DIAGNOSIS — J449 Chronic obstructive pulmonary disease, unspecified: Secondary | ICD-10-CM | POA: Diagnosis not present

## 2017-03-25 DIAGNOSIS — I10 Essential (primary) hypertension: Secondary | ICD-10-CM | POA: Diagnosis not present

## 2017-03-25 DIAGNOSIS — F0391 Unspecified dementia with behavioral disturbance: Secondary | ICD-10-CM | POA: Diagnosis not present

## 2017-03-25 DIAGNOSIS — F209 Schizophrenia, unspecified: Secondary | ICD-10-CM | POA: Diagnosis not present

## 2017-03-25 DIAGNOSIS — F319 Bipolar disorder, unspecified: Secondary | ICD-10-CM | POA: Diagnosis not present

## 2017-03-25 DIAGNOSIS — T8131XA Disruption of external operation (surgical) wound, not elsewhere classified, initial encounter: Secondary | ICD-10-CM | POA: Diagnosis not present

## 2017-04-17 ENCOUNTER — Emergency Department (HOSPITAL_COMMUNITY)
Admission: EM | Admit: 2017-04-17 | Discharge: 2017-04-17 | Disposition: A | Discharge status: Home / Self Care | Payer: Medicare Other | Attending: Emergency Medicine | Admitting: Emergency Medicine

## 2017-04-17 ENCOUNTER — Encounter (HOSPITAL_COMMUNITY): Payer: Self-pay

## 2017-04-17 ENCOUNTER — Emergency Department (HOSPITAL_COMMUNITY): Payer: Medicare Other

## 2017-04-17 DIAGNOSIS — R932 Abnormal findings on diagnostic imaging of liver and biliary tract: Secondary | ICD-10-CM | POA: Diagnosis not present

## 2017-04-17 DIAGNOSIS — Z87891 Personal history of nicotine dependence: Secondary | ICD-10-CM | POA: Diagnosis not present

## 2017-04-17 DIAGNOSIS — K769 Liver disease, unspecified: Secondary | ICD-10-CM | POA: Diagnosis not present

## 2017-04-17 DIAGNOSIS — R300 Dysuria: Secondary | ICD-10-CM | POA: Diagnosis not present

## 2017-04-17 DIAGNOSIS — F0390 Unspecified dementia without behavioral disturbance: Secondary | ICD-10-CM | POA: Insufficient documentation

## 2017-04-17 DIAGNOSIS — I10 Essential (primary) hypertension: Secondary | ICD-10-CM | POA: Insufficient documentation

## 2017-04-17 DIAGNOSIS — Z79899 Other long term (current) drug therapy: Secondary | ICD-10-CM | POA: Insufficient documentation

## 2017-04-17 DIAGNOSIS — R109 Unspecified abdominal pain: Secondary | ICD-10-CM | POA: Diagnosis not present

## 2017-04-17 LAB — I-STAT CHEM 8, ED
BUN: 13 mg/dL (ref 6–20)
Calcium, Ion: 1.15 mmol/L (ref 1.15–1.40)
Chloride: 103 mmol/L (ref 101–111)
Creatinine, Ser: 0.5 mg/dL (ref 0.44–1.00)
Glucose, Bld: 89 mg/dL (ref 65–99)
HCT: 34 % — ABNORMAL LOW (ref 36.0–46.0)
Hemoglobin: 11.6 g/dL — ABNORMAL LOW (ref 12.0–15.0)
Potassium: 3.7 mmol/L (ref 3.5–5.1)
SODIUM: 140 mmol/L (ref 135–145)
TCO2: 26 mmol/L (ref 22–32)

## 2017-04-17 LAB — COMPREHENSIVE METABOLIC PANEL
ALBUMIN: 3.4 g/dL — AB (ref 3.5–5.0)
ALK PHOS: 55 U/L (ref 38–126)
ALT: 9 U/L — ABNORMAL LOW (ref 14–54)
ANION GAP: 8 (ref 5–15)
AST: 22 U/L (ref 15–41)
BILIRUBIN TOTAL: 0.7 mg/dL (ref 0.3–1.2)
BUN: 15 mg/dL (ref 6–20)
CALCIUM: 8.7 mg/dL — AB (ref 8.9–10.3)
CO2: 25 mmol/L (ref 22–32)
Chloride: 106 mmol/L (ref 101–111)
Creatinine, Ser: 0.5 mg/dL (ref 0.44–1.00)
GLUCOSE: 90 mg/dL (ref 65–99)
Potassium: 3.7 mmol/L (ref 3.5–5.1)
Sodium: 139 mmol/L (ref 135–145)
TOTAL PROTEIN: 6.9 g/dL (ref 6.5–8.1)

## 2017-04-17 LAB — CBC WITH DIFFERENTIAL/PLATELET
Basophils Absolute: 0 10*3/uL (ref 0.0–0.1)
Basophils Relative: 0 %
Eosinophils Absolute: 0 10*3/uL (ref 0.0–0.7)
Eosinophils Relative: 0 %
HEMATOCRIT: 35.9 % — AB (ref 36.0–46.0)
HEMOGLOBIN: 11.8 g/dL — AB (ref 12.0–15.0)
LYMPHS ABS: 0.6 10*3/uL — AB (ref 0.7–4.0)
Lymphocytes Relative: 13 %
MCH: 28.4 pg (ref 26.0–34.0)
MCHC: 32.9 g/dL (ref 30.0–36.0)
MCV: 86.5 fL (ref 78.0–100.0)
MONOS PCT: 8 %
Monocytes Absolute: 0.4 10*3/uL (ref 0.1–1.0)
NEUTROS ABS: 3.9 10*3/uL (ref 1.7–7.7)
NEUTROS PCT: 79 %
Platelets: 198 10*3/uL (ref 150–400)
RBC: 4.15 MIL/uL (ref 3.87–5.11)
RDW: 14.1 % (ref 11.5–15.5)
WBC: 4.9 10*3/uL (ref 4.0–10.5)

## 2017-04-17 LAB — URINALYSIS, ROUTINE W REFLEX MICROSCOPIC
Bacteria, UA: NONE SEEN
Bilirubin Urine: NEGATIVE
GLUCOSE, UA: NEGATIVE mg/dL
Hgb urine dipstick: NEGATIVE
Ketones, ur: NEGATIVE mg/dL
Leukocytes, UA: NEGATIVE
Nitrite: NEGATIVE
PH: 5 (ref 5.0–8.0)
Protein, ur: 30 mg/dL — AB
SPECIFIC GRAVITY, URINE: 1.019 (ref 1.005–1.030)

## 2017-04-17 LAB — LIPASE, BLOOD: Lipase: 33 U/L (ref 11–51)

## 2017-04-17 LAB — I-STAT CG4 LACTIC ACID, ED: Lactic Acid, Venous: <0.3 mmol/L — ABNORMAL LOW (ref 0.5–1.9)

## 2017-04-17 MED ORDER — HALOPERIDOL LACTATE 5 MG/ML IJ SOLN
5.0000 mg | Freq: Once | INTRAMUSCULAR | Status: AC
  Administered 2017-04-17: 5 mg via INTRAMUSCULAR
  Filled 2017-04-17: qty 1

## 2017-04-17 MED ORDER — SODIUM CHLORIDE 0.9 % IV BOLUS (SEPSIS)
1000.0000 mL | Freq: Once | INTRAVENOUS | Status: AC
  Administered 2017-04-17: 1000 mL via INTRAVENOUS

## 2017-04-17 MED ORDER — IOPAMIDOL (ISOVUE-300) INJECTION 61%
75.0000 mL | Freq: Once | INTRAVENOUS | Status: AC | PRN
  Administered 2017-04-17: 75 mL via INTRAVENOUS

## 2017-04-17 MED ORDER — LORAZEPAM 2 MG/ML IJ SOLN
1.0000 mg | Freq: Once | INTRAMUSCULAR | Status: AC
  Administered 2017-04-17: 1 mg via INTRAMUSCULAR
  Filled 2017-04-17: qty 1

## 2017-04-17 MED ORDER — HALOPERIDOL LACTATE 5 MG/ML IJ SOLN
5.0000 mg | Freq: Once | INTRAMUSCULAR | Status: AC
  Administered 2017-04-17: 5 mg via INTRAMUSCULAR

## 2017-04-17 MED ORDER — HALOPERIDOL LACTATE 5 MG/ML IJ SOLN
INTRAMUSCULAR | Status: AC
  Filled 2017-04-17: qty 1

## 2017-04-17 MED ORDER — LORAZEPAM 2 MG/ML IJ SOLN
INTRAMUSCULAR | Status: AC
  Filled 2017-04-17: qty 1

## 2017-04-17 MED ORDER — LORAZEPAM 2 MG/ML IJ SOLN
1.0000 mg | Freq: Once | INTRAMUSCULAR | Status: AC
  Administered 2017-04-17: 1 mg via INTRAMUSCULAR

## 2017-04-17 NOTE — ED Triage Notes (Signed)
Per pt's daughter, pt has been c/o pain "down there" and when questioned, pt admits it hurts when she urinates.

## 2017-04-17 NOTE — ED Provider Notes (Signed)
[facility] EMERGENCY DEPARTMENT Provider Note   CSN: [id number] Arrival date & time: 04/17/17  0228     History   Chief Complaint Chief Complaint  Patient presents with  . Dysuria    HPI Brittney Wall is a 72 y.o. female.  Level 5 caveat for dementia and schizophrenia.  Patient here with her daughter.  She is not able to give a history due to her dementia.  Daughter states patient was complaining of abdominal pain, vaginal pain and left hip pain.  No known falls or trauma.  Daughter recently took her mother back from another caregiver.  No vomiting or fever.  No pain with urination or blood in the urine.  Patient was admitted in November with a similar issue and found to have a incarcerated obturator hernia that required surgery x2.  Daughter states that last bowel movement was 4 days ago.  No episodes of vomiting.  No fever.   The history is provided by the patient and a relative. The history is limited by the condition of the patient.  Dysuria      Past Medical History:  Diagnosis Date  . Bipolar 1 disorder ([place])   . Dementia   . Hypertension   . Left adrenal mass ([place]) 09/06/2016  . Pneumothorax on right 09/07/2016  . Schizophrenia [facility])     Patient Active Problem List   Diagnosis Date Noted  . Fascial defect   . Wound dehiscence 01/03/2017  . Small bowel obstruction ([place]) 12/23/2016  . Hypokalemia 12/23/2016  . Obturator hernia   . Ischemic bowel disease ([place])   . Pneumothorax on right 09/07/2016  . Left adrenal mass ([place]) 09/06/2016  . Abnormal CT scan 09/06/2016  . Compression fracture of L5 lumbar vertebra ([place]) 09/06/2016  . Malignant hypertension 09/06/2016  . Ileus ([place]) 09/05/2016  . Chest pain 03/15/2015  . Hypertensive urgency 03/15/2015  . Schizophrenia ([place]) 03/15/2015  . Tobacco abuse 03/15/2015  . Dementia with behavioral disturbance 03/15/2015  . Protein-calorie malnutrition, severe ([employer]) 03/15/2015  . Chest pain at rest 03/15/2015     Past Surgical History:  Procedure Laterality Date  . BOWEL RESECTION  12/23/2016   Procedure: SMALL BOWEL RESECTION;  Surgeon: Virl Cagey, MD;  Location: [facility] ORS;  Service: General;;  . Fatima Blank HERNIA REPAIR  01/04/2017   Procedure: Jerry Caras WITH MESH;  Surgeon: Aviva Signs, MD;  Location: [facility] ORS;  Service: General;;  . LAPAROTOMY N/A 12/23/2016   Procedure: EXPLORATORY LAPAROTOMY;  Surgeon: Virl Cagey, MD;  Location: [facility] ORS;  Service: General;  Laterality: N/A;  . ortho procedure      OB History    No data available       Home Medications    Prior to Admission medications   Medication Sig Start Date End Date Taking? Authorizing Provider  cloNIDine (CATAPRES) 0.1 MG tablet Take 0.1 mg by mouth 3 (three) times daily.      [provider]  haloperidol (HALDOL) 5 MG tablet Take 5 mg by mouth at bedtime.  02/16/15   [provider]  hydrALAZINE (APRESOLINE) 10 MG tablet Take 2 tablets (20 mg total) by mouth at bedtime. Patient taking differently: Take 10 mg by mouth at bedtime.  09/07/16   Rexene Alberts, MD  HYDROcodone-acetaminophen (HYCET) 7.5-325 mg/15 ml solution Take 5 mLs by mouth every 6 (six) hours as needed for moderate pain or severe pain. 01/06/17   Virl Cagey, MD  LORazepam (ATIVAN) 0.5 MG tablet Take 0.5  mg by mouth at bedtime as needed. 07/19/16   [provider]  magnesium hydroxide (MILK OF MAGNESIA) 400 MG/5ML suspension Take 15 mLs by mouth daily. 01/06/17   Virl Cagey, MD  minoxidil (LONITEN) 2.5 MG tablet Take 2 tablets (5 mg total) by mouth 2 (two) times daily. 03/17/15   Donne Hazel, MD  mirtazapine (REMERON) 30 MG tablet Take 30 mg by mouth at bedtime.      [provider]  pravastatin (PRAVACHOL) 20 MG tablet Take 20 mg by mouth at bedtime.     [provider]  traZODone (DESYREL) 100 MG tablet Take 200 mg by mouth at bedtime as needed for sleep.    [provider]    Family History No family history on file.  Social History Social History   Tobacco Use  . Smoking status: Former Smoker    Packs/day: 0.50    Types: Cigarettes  . Smokeless tobacco: Never Used  Substance Use Topics  . Alcohol use: No  . Drug use: No     Allergies   Patient has no known allergies.   Review of Systems Review of Systems  Unable to perform ROS: Dementia  Genitourinary: Positive for dysuria.     Physical Exam Updated Vital Signs BP (!) 151/74 (BP Location: Left Arm)   Temp 98 F (36.7 C) (Axillary)   Resp 18   SpO2 98%   Physical Exam  Constitutional: She appears well-developed and well-nourished. No distress.  Aggressive, argumentative, not able to give a history  HENT:  Head: Normocephalic and atraumatic.  Mouth/Throat: Oropharynx is clear and moist. No oropharyngeal exudate.  Mildly dry mucus membranes  Eyes: Conjunctivae and EOM are normal. Pupils are equal, round, and reactive to light.  Neck: Normal range of motion. Neck supple.  No meningismus.  Cardiovascular: Normal rate, regular rhythm, normal heart sounds and intact distal pulses.  No murmur heard. Pulmonary/Chest: Effort normal and breath sounds normal. No respiratory distress.  Abdominal: Soft. There is no tenderness. There is no rebound and no guarding.  Abdomen is soft.  There is no guarding or rebound.  Midline surgical incision appears to be clean with minimal clear drainage.   Genitourinary:  Genitourinary Comments: External genitalia appears normal.  Musculoskeletal: Normal range of motion. She exhibits no edema or tenderness.  FROM bilateral hips without pain. No CVAT.  Neurological: She is alert. No cranial nerve deficit. She exhibits normal muscle tone. Coordination normal.  Moves all extremities, no apparent deficits.  Skin: Skin is warm. Capillary refill takes less than 2 seconds. No rash noted.  Psychiatric: She has a normal mood and affect. Her  behavior is normal.  Nursing note and vitals reviewed.    ED Treatments / Results  Labs (all labs ordered are listed, but only abnormal results are displayed) Labs Reviewed  URINALYSIS, ROUTINE W REFLEX MICROSCOPIC - Abnormal; Notable for the following components:      Result Value   Protein, ur 30 (*)    Squamous Epithelial / LPF 0-5 (*)    All other components within normal limits  CBC WITH DIFFERENTIAL/PLATELET - Abnormal; Notable for the following components:   Hemoglobin 11.8 (*)    HCT 35.9 (*)    Lymphs Abs 0.6 (*)    All other components within normal limits  COMPREHENSIVE METABOLIC PANEL - Abnormal; Notable for the following components:   Calcium 8.7 (*)    Albumin 3.4 (*)    ALT 9 (*)  All other components within normal limits  I-STAT CG4 LACTIC ACID, ED - Abnormal; Notable for the following components:   Lactic Acid, Venous <0.30 (*)    All other components within normal limits  I-STAT CHEM 8, ED - Abnormal; Notable for the following components:   Hemoglobin 11.6 (*)    HCT 34.0 (*)    All other components within normal limits  URINE CULTURE  LIPASE, BLOOD    EKG  EKG Interpretation None       Radiology Ct Abdomen Pelvis W Contrast  Result Date: 04/17/2017 CLINICAL DATA:  Acute abdominal pain.  Painful urination. EXAM: CT ABDOMEN AND PELVIS WITH CONTRAST TECHNIQUE: Multidetector CT imaging of the abdomen and pelvis was performed using the standard protocol following bolus administration of intravenous contrast. CONTRAST:  74mL ISOVUE-300 IOPAMIDOL (ISOVUE-300) INJECTION 61% COMPARISON:  CT 01/01/2017 FINDINGS: Lower chest: Patchy ground-glass opacity in the left lower lobe. The heart is enlarged. Hepatobiliary: 2.8 cm low-density lesion in the right hepatic dome is new from prior exam, with possible central fluid density. Second smaller lesion in the left lobe measures approximately 1.2 cm, also new. Gallbladder physiologically distended, no calcified stone.  No biliary dilatation. Pancreas: No ductal dilatation or inflammation. Spleen: Normal in size without focal abnormality. Adrenals/Urinary Tract: Grossly unchanged left adrenal thickening/nodule measuring 17 mm. No right adrenal lesion. No hydronephrosis or perinephric edema. Homogeneous renal enhancement with symmetric excretion on delayed phase imaging. Subcentimeter low-density lesion in the mid anterior left kidney is nonspecific but unchanged from prior exam and likely cyst. Urinary bladder is physiologically distended without wall thickening. Small cystocele. Stomach/Bowel: Lack of enteric contrast and generalized paucity of body fat limits bowel assessment. The stomach is nondistended. No evidence of bowel obstruction. Enteric sutures in the right lower abdomen. Moderate volume of stool throughout the colon. No definite bowel inflammation, however evaluation is limited. Vascular/Lymphatic: Advanced aortic and branch atherosclerosis. No bulky adenopathy. Reproductive: Enhancing 3.3 cm uterine lesion unchanged most consistent with fibroid. No gross adnexal mass. Other: Previous ascites has resolved. No free air. No visualized abscess. Postsurgical change of the anterior abdominal wall. Musculoskeletal: Chronic L5 compression fracture. Multilevel degenerative change in the spine. No acute osseous abnormality. IMPRESSION: 1. Two hepatic lesions are new from prior CT 4 months prior, largest in the right lobe measures 2.8 cm, small on the left lobe measuring 1.2 cm. There is suggestion of central low density within the larger lesion which may indicate central necrosis. Differential considerations include neoplasm or abscess/infection. Hepatic MRI with and without contrast could be considered for further evaluation, only if patient is able tolerate breath hold technique. 2. Probable small cystocele.  No bladder wall thickening. 3. Patchy ground-glass opacities in the left lower lobe, atelectasis versus pneumonitis.  4. Chronic and incidental findings include uterine fibroids, and Aortic Atherosclerosis (ICD10-I70.0). Electronically Signed   By: Jeb Levering M.D.   On: 04/17/2017 06:03    Procedures Procedures (including critical care time)  Medications Ordered in ED Medications  LORazepam (ATIVAN) injection 1 mg (not administered)  sodium chloride 0.9 % bolus 1,000 mL (not administered)     Initial Impression / Assessment and Plan / ED Course  I have reviewed the triage vital signs and the nursing notes.  Pertinent labs & imaging results that were available during my care of the patient were reviewed by me and considered in my medical decision making (see chart for details).    Aggressive dementia patient presenting with questionable abdominal pain and pelvic pain.  No fever.  No vomiting.  Has been constipated. Abdomen without peritoneal signs.  Patient did require chemical sedation for patient and staff safety as well as to facilitate workup.  Urinalysis is negative for infection.  Labs are reassuring.  Lactate is normal.  Patient had similar presentation in November 2018 when she had a strangulated obturator hernia.  CT scan today shows no hernia recurrence.  However there are 2 new liver lesions noted.  Results d/w daughter at bedside. Liver lesions discussed and need for MRI for further characterization if desired. Doubt infectious without fever or leukocytosis. Patient tolerating PO and denies pain. She appears stable for outpatient followup. Return precautions discussed. Final Clinical Impressions(s) / ED Diagnoses   Final diagnoses:  Dysuria  Liver lesion    ED Discharge Orders    None       Azaliah Carrero, Annie Main, MD 04/17/17 2203

## 2017-04-17 NOTE — Discharge Instructions (Signed)
There is no signs of urinary tract infection.  CT scan does not show any new hernias.  There are 2 lesions in the liver that need further evaluation probably by MRI.  Follow-up with your primary doctor.  Return to the ED if you develop new or worsening symptoms.

## 2017-04-18 LAB — URINE CULTURE

## 2017-04-23 ENCOUNTER — Other Ambulatory Visit (HOSPITAL_COMMUNITY): Payer: Self-pay | Admitting: Family Medicine

## 2017-04-23 DIAGNOSIS — K769 Liver disease, unspecified: Secondary | ICD-10-CM

## 2017-04-30 DIAGNOSIS — J449 Chronic obstructive pulmonary disease, unspecified: Secondary | ICD-10-CM | POA: Diagnosis not present

## 2017-04-30 DIAGNOSIS — I10 Essential (primary) hypertension: Secondary | ICD-10-CM | POA: Diagnosis not present

## 2017-04-30 DIAGNOSIS — R634 Abnormal weight loss: Secondary | ICD-10-CM | POA: Diagnosis not present

## 2017-05-01 ENCOUNTER — Other Ambulatory Visit (HOSPITAL_COMMUNITY): Payer: Self-pay | Admitting: Family Medicine

## 2017-05-01 DIAGNOSIS — Z1231 Encounter for screening mammogram for malignant neoplasm of breast: Secondary | ICD-10-CM

## 2017-05-05 ENCOUNTER — Ambulatory Visit (HOSPITAL_COMMUNITY)
Admission: RE | Admit: 2017-05-05 | Discharge: 2017-05-05 | Disposition: A | Discharge status: Home / Self Care | Payer: Medicare Other | Source: Ambulatory Visit | Attending: Family Medicine | Admitting: Family Medicine

## 2017-05-05 DIAGNOSIS — Z1231 Encounter for screening mammogram for malignant neoplasm of breast: Secondary | ICD-10-CM

## 2017-05-12 ENCOUNTER — Encounter (HOSPITAL_COMMUNITY): Payer: Self-pay | Admitting: Emergency Medicine

## 2017-05-12 ENCOUNTER — Other Ambulatory Visit: Payer: Self-pay

## 2017-05-12 ENCOUNTER — Encounter (HOSPITAL_COMMUNITY): Payer: Self-pay | Admitting: *Deleted

## 2017-05-12 NOTE — Progress Notes (Signed)
Spoke with pt's daughter, Danae Chen for pre-op call. She states pt does not have a cardiac history. States she is not diabetic. Pt does have dementia, schizophrenia and bipolar disorder.

## 2017-05-12 NOTE — Anesthesia Preprocedure Evaluation (Deleted)
Anesthesia Evaluation  Patient identified by MRN, date of birth, ID band Patient confused  General Assessment Comment:Ox0, responds slowly to commands   Reviewed: Allergy & Precautions, NPO status , Patient's Chart, lab work & pertinent test results  Airway Mallampati: III  TM Distance: <3 FB   Mouth opening: Limited Mouth Opening  Dental  (+) Edentulous Upper, Edentulous Lower   Pulmonary former smoker,    + rhonchi        Cardiovascular hypertension, Pt. on medications  Rhythm:Regular Rate:Normal   EKG 12/23/16: NSR. Possible Left atrial enlargement. LVH with repolarization abnormality  Echo 03/16/15:  - Left ventricle: The cavity size was normal. Wall thickness wasincreased in a pattern of moderate LVH. There was severe focalbasal hypertrophy of the septum. Systolic function was normal.The estimated ejection fraction was in the range of 60% to 65%.Wall motion was normal; there were no regional wall motionabnormalities. Doppler parameters are consistent with abnormalleft ventricular relaxation (grade 1 diastolic dysfunction).Doppler parameters are consistent with high ventricular filling pressure. - Aortic valve: Mildly calcified annulus. Trileaflet. - Mitral valve: Mildly thickened leaflets . There was mild regurgitation. - Left atrium: The atrium was at the upper limits of normal in size. - Right atrium: Central venous pressure (est): 3 mm Hg. - Atrial septum: No defect or patent foramen ovale was identified. - Tricuspid valve: There was mild regurgitation. - Pulmonary arteries: PA peak pressure: 32 mm Hg (S). - Pericardium, extracardiac: A trivial pericardial effusion was identified. - Impressions: Moderate LVH with severe septal hypertrophy and LVEF 60-65%.Grade 1 diastolic dysfunction with increased filling pressures. Upper normal left atrial chamber size. Mildly thickened mitral leaflets with mild mitral regurgitation.  Mildly thickened tricuspid leaflets with mild tricuspid regurgitation and PASP 64mmHg. Trivial pericardial effusion     Neuro/Psych PSYCHIATRIC DISORDERS Bipolar Disorder Schizophrenia negative neurological ROS     GI/Hepatic Neg liver ROS, Hernia with SBO   Endo/Other    Renal/GU negative Renal ROS     Musculoskeletal   Abdominal   Peds  Hematology negative hematology ROS (+)   Anesthesia Other Findings   Reproductive/Obstetrics                             Anesthesia Physical  Anesthesia Plan  ASA: IV  Anesthesia Plan: General   Post-op Pain Management:    Induction: Intravenous  PONV Risk Score and Plan: 3 and Treatment may vary due to age or medical condition  Airway Management Planned: Oral ETT and LMA  Additional Equipment:   Intra-op Plan:   Post-operative Plan: Extubation in OR  Informed Consent: I have reviewed the patients History and Physical, chart, labs and discussed the procedure including the risks, benefits and alternatives for the proposed anesthesia with the patient or authorized representative who has indicated his/her understanding and acceptance.     Plan Discussed with: Surgeon, CRNA and Anesthesiologist  Anesthesia Plan Comments:         Anesthesia Quick Evaluation

## 2017-05-12 NOTE — Progress Notes (Signed)
Anesthesia Chart Review:  Pt is a same day work up.   Pt is a 72 year old female scheduled for MRI liver with anesthesia on 05/13/2017   - PCP is [name], MD. H&P dated 04/30/17 in media tab  PMH includes:  HTN, schizophrenia, dementia, bipolar disorder, L adrenal mass. Former smoker. BMI 12.5.  - S/p exploratory laparotomy, small bowel resection 12/23/16 and incisional herniorrhaphy with mesh 01/04/17.   Medications include: Clonidine, Haldol, hydralazine, Ativan, minoxidil, Remeron, pravastatin, trazodone  Labs will be obtained day of surgery  1 view CXR 12/23/16: No active disease  EKG 12/23/16: NSR. Possible Left atrial enlargement. LVH with repolarization abnormality  Echo 03/16/15:  - Left ventricle: The cavity size was normal. Wall thickness was increased in a pattern of moderate LVH. There was severe focal basal hypertrophy of the septum. Systolic function was normal. The estimated ejection fraction was in the range of 60% to 65%. Wall motion was normal; there were no regional wall motion abnormalities. Doppler parameters are consistent with abnormal left ventricular relaxation (grade 1 diastolic dysfunction). Doppler parameters are consistent with high ventricular filling pressure. - Aortic valve: Mildly calcified annulus. Trileaflet. - Mitral valve: Mildly thickened leaflets . There was mild regurgitation. - Left atrium: The atrium was at the upper limits of normal in size. - Right atrium: Central venous pressure (est): 3 mm Hg. - Atrial septum: No defect or patent foramen ovale was identified. - Tricuspid valve: There was mild regurgitation. - Pulmonary arteries: PA peak pressure: 32 mm Hg (S). - Pericardium, extracardiac: A trivial pericardial effusion was identified. - Impressions: Moderate LVH with severe septal hypertrophy and LVEF 60-65%. Grade 1 diastolic dysfunction with increased filling pressures. Upper normal left atrial chamber size. Mildly thickened mitral leaflets  with mild mitral regurgitation. Mildly thickened tricuspid leaflets with mild tricuspid regurgitation and PASP 32 mmHg. Trivial pericardial effusion.  If labs acceptable day of surgery, I anticipate pt can proceed with surgery as scheduled.   Willeen Cass, FNP-BC [facility]/Anesthesiology Phone: [phone] 05/12/2017 12:19 PM

## 2017-05-13 ENCOUNTER — Ambulatory Visit (HOSPITAL_COMMUNITY)
Admission: RE | Admit: 2017-05-13 | Discharge: 2017-05-13 | Disposition: A | Discharge status: Home / Self Care | Payer: Medicare Other | Source: Ambulatory Visit | Attending: Family Medicine | Admitting: Family Medicine

## 2017-05-13 ENCOUNTER — Ambulatory Visit (HOSPITAL_COMMUNITY): Admission: RE | Admit: 2017-05-13 | Payer: Medicare Other | Source: Ambulatory Visit

## 2017-05-13 ENCOUNTER — Encounter (HOSPITAL_COMMUNITY): Payer: Self-pay

## 2017-05-13 SURGERY — MRI WITH ANESTHESIA
Anesthesia: General

## 2017-05-27 DIAGNOSIS — I10 Essential (primary) hypertension: Secondary | ICD-10-CM | POA: Diagnosis not present

## 2017-05-27 DIAGNOSIS — F209 Schizophrenia, unspecified: Secondary | ICD-10-CM | POA: Diagnosis not present

## 2017-05-27 DIAGNOSIS — K7689 Other specified diseases of liver: Secondary | ICD-10-CM | POA: Diagnosis not present

## 2017-05-27 DIAGNOSIS — J449 Chronic obstructive pulmonary disease, unspecified: Secondary | ICD-10-CM | POA: Diagnosis not present

## 2017-05-27 DIAGNOSIS — F0390 Unspecified dementia without behavioral disturbance: Secondary | ICD-10-CM | POA: Diagnosis not present

## 2017-05-27 DIAGNOSIS — E785 Hyperlipidemia, unspecified: Secondary | ICD-10-CM | POA: Diagnosis not present

## 2017-05-31 ENCOUNTER — Encounter (HOSPITAL_COMMUNITY): Payer: Self-pay | Admitting: *Deleted

## 2017-05-31 NOTE — Progress Notes (Signed)
Danae Chen patient's daughter states she has a hard time getting medication into mother without food. I told her if she was unable to get the medication in to let nurse know on patient arrival.  Daughter is unsure if she has guardianship over mother or HCPOA. I requested that she bring in paperwork so that we can review the information.

## 2017-06-02 DIAGNOSIS — I10 Essential (primary) hypertension: Secondary | ICD-10-CM | POA: Diagnosis not present

## 2017-06-03 ENCOUNTER — Ambulatory Visit (HOSPITAL_COMMUNITY)
Admission: RE | Admit: 2017-06-03 | Discharge: 2017-06-03 | Disposition: A | Discharge status: Home / Self Care | Payer: Medicare Other | Source: Ambulatory Visit | Attending: Family Medicine | Admitting: Family Medicine

## 2017-06-03 ENCOUNTER — Ambulatory Visit (HOSPITAL_COMMUNITY): Payer: Medicare Other | Admitting: Certified Registered Nurse Anesthetist

## 2017-06-03 ENCOUNTER — Ambulatory Visit (HOSPITAL_COMMUNITY)
Admission: RE | Admit: 2017-06-03 | Discharge: 2017-06-03 | Disposition: A | Discharge status: Home / Self Care | Payer: Medicare Other | Source: Ambulatory Visit | Attending: Diagnostic Radiology | Admitting: Diagnostic Radiology

## 2017-06-03 ENCOUNTER — Encounter (HOSPITAL_COMMUNITY): Payer: Self-pay | Admitting: Certified Registered Nurse Anesthetist

## 2017-06-03 ENCOUNTER — Encounter (HOSPITAL_COMMUNITY): Admission: RE | Disposition: A | Discharge status: Home / Self Care | Payer: Self-pay | Source: Ambulatory Visit

## 2017-06-03 DIAGNOSIS — E43 Unspecified severe protein-calorie malnutrition: Secondary | ICD-10-CM | POA: Diagnosis not present

## 2017-06-03 DIAGNOSIS — Z79899 Other long term (current) drug therapy: Secondary | ICD-10-CM | POA: Insufficient documentation

## 2017-06-03 DIAGNOSIS — I10 Essential (primary) hypertension: Secondary | ICD-10-CM | POA: Insufficient documentation

## 2017-06-03 DIAGNOSIS — K769 Liver disease, unspecified: Secondary | ICD-10-CM | POA: Diagnosis not present

## 2017-06-03 DIAGNOSIS — F209 Schizophrenia, unspecified: Secondary | ICD-10-CM | POA: Diagnosis not present

## 2017-06-03 DIAGNOSIS — Z87891 Personal history of nicotine dependence: Secondary | ICD-10-CM | POA: Insufficient documentation

## 2017-06-03 DIAGNOSIS — F0390 Unspecified dementia without behavioral disturbance: Secondary | ICD-10-CM | POA: Insufficient documentation

## 2017-06-03 DIAGNOSIS — D3502 Benign neoplasm of left adrenal gland: Secondary | ICD-10-CM | POA: Diagnosis not present

## 2017-06-03 DIAGNOSIS — K7689 Other specified diseases of liver: Secondary | ICD-10-CM | POA: Diagnosis not present

## 2017-06-03 HISTORY — PX: RADIOLOGY WITH ANESTHESIA: SHX6223

## 2017-06-03 SURGERY — MRI WITH ANESTHESIA
Anesthesia: General

## 2017-06-03 MED ORDER — GADOBENATE DIMEGLUMINE 529 MG/ML IV SOLN
10.0000 mL | Freq: Once | INTRAVENOUS | Status: AC | PRN
  Administered 2017-06-03: 10 mL via INTRAVENOUS

## 2017-06-03 MED ORDER — KETAMINE HCL 100 MG/ML IJ SOLN
INTRAMUSCULAR | Status: AC
  Filled 2017-06-03: qty 1

## 2017-06-03 MED ORDER — MIDAZOLAM HCL 10 MG/2ML IJ SOLN
INTRAMUSCULAR | Status: AC
  Filled 2017-06-03: qty 2

## 2017-06-03 NOTE — Discharge Instructions (Signed)

## 2017-06-03 NOTE — Anesthesia Preprocedure Evaluation (Deleted)
Anesthesia Evaluation  Patient identified by MRN, date of birth, ID band Patient confused  General Assessment Comment:Ox0, responds slowly to commands   Reviewed: Allergy & Precautions, NPO status , Patient's Chart, lab work & pertinent test results  Airway Mallampati: III  TM Distance: <3 FB   Mouth opening: Limited Mouth Opening  Dental  (+) Edentulous Upper, Edentulous Lower   Pulmonary former smoker,    + rhonchi        Cardiovascular hypertension, Pt. on medications  Rhythm:Regular Rate:Normal   EKG 12/23/16: NSR. Possible Left atrial enlargement. LVH with repolarization abnormality  Echo 03/16/15:  - Left ventricle: The cavity size was normal. Wall thickness wasincreased in a pattern of moderate LVH. There was severe focalbasal hypertrophy of the septum. Systolic function was normal.The estimated ejection fraction was in the range of 60% to 65%.Wall motion was normal; there were no regional wall motionabnormalities. Doppler parameters are consistent with abnormalleft ventricular relaxation (grade 1 diastolic dysfunction).Doppler parameters are consistent with high ventricular filling pressure. - Aortic valve: Mildly calcified annulus. Trileaflet. - Mitral valve: Mildly thickened leaflets . There was mild regurgitation. - Left atrium: The atrium was at the upper limits of normal in size. - Right atrium: Central venous pressure (est): 3 mm Hg. - Atrial septum: No defect or patent foramen ovale was identified. - Tricuspid valve: There was mild regurgitation. - Pulmonary arteries: PA peak pressure: 32 mm Hg (S). - Pericardium, extracardiac: A trivial pericardial effusion was identified. - Impressions: Moderate LVH with severe septal hypertrophy and LVEF 60-65%.Grade 1 diastolic dysfunction with increased filling pressures. Upper normal left atrial chamber size. Mildly thickened mitral leaflets with mild mitral regurgitation.  Mildly thickened tricuspid leaflets with mild tricuspid regurgitation and PASP 11mmHg. Trivial pericardial effusion     Neuro/Psych Bipolar Disorder Schizophrenia Dementia negative neurological ROS     GI/Hepatic Neg liver ROS, Hernia with SBO   Endo/Other    Renal/GU Left adrenal mass     Musculoskeletal   Abdominal   Peds  Hematology negative hematology ROS (+)   Anesthesia Other Findings Severe malnutrition  Reproductive/Obstetrics                             Anesthesia Physical  Anesthesia Plan  ASA: IV  Anesthesia Plan: General   Post-op Pain Management:    Induction: Intravenous  PONV Risk Score and Plan: 3 and Treatment may vary due to age or medical condition and Ondansetron  Airway Management Planned: Oral ETT and Video Laryngoscope Planned  Additional Equipment: None  Intra-op Plan:   Post-operative Plan: Extubation in OR  Informed Consent: I have reviewed the patients History and Physical, chart, labs and discussed the procedure including the risks, benefits and alternatives for the proposed anesthesia with the patient or authorized representative who has indicated his/her understanding and acceptance.   Consent reviewed with POA  Plan Discussed with: CRNA and Anesthesiologist  Anesthesia Plan Comments: (Past two intubations with glidescope. Plan for IV placement in preop, but if unable to be placed due to patient compliance, will perform IM induction with ketamine/versed.)       Anesthesia Quick Evaluation

## 2017-06-03 NOTE — Progress Notes (Addendum)
Daughter does have legal guardianship over mother, but has forgotten paperwork at home. Paperwork for MRI given to daughter to fill out.

## 2017-06-04 ENCOUNTER — Encounter (HOSPITAL_COMMUNITY): Payer: Self-pay | Admitting: Radiology

## 2017-06-04 MED FILL — Ketamine HCl Soln Pref Syr 100 MG/10ML (10 MG/ML): INTRAMUSCULAR | Qty: 10 | Status: AC

## 2017-06-04 MED FILL — Phenylephrine-NaCl Pref Syr 0.4 MG/10ML-0.9% (40 MCG/ML): INTRAVENOUS | Qty: 10 | Status: AC

## 2017-06-04 MED FILL — Succinylcholine Chloride Sol Pref Syr 200 MG/10ML (20 MG/ML): INTRAVENOUS | Qty: 10 | Status: AC

## 2017-06-04 MED FILL — Lidocaine HCl Local Soln Prefilled Syringe 100 MG/5ML (2%): INTRAMUSCULAR | Qty: 5 | Status: AC

## 2017-06-04 MED FILL — Glycopyrrolate Inj 0.2 MG/ML: INTRAMUSCULAR | Qty: 1 | Status: AC

## 2017-06-04 MED FILL — Lactated Ringer's Solution: INTRAVENOUS | Qty: 1000 | Status: AC

## 2017-06-04 MED FILL — Midazolam HCl Inj 5 MG/ML (Base Equivalent): INTRAMUSCULAR | Qty: 1 | Status: AC

## 2017-06-04 MED FILL — Ephedrine Sulf-NaCl Soln Pref Syr 50 MG/10ML-0.9% (5 MG/ML): INTRAVENOUS | Qty: 10 | Status: AC

## 2017-06-04 MED FILL — Dexamethasone Sodium Phosphate Inj 10 MG/ML: INTRAMUSCULAR | Qty: 1 | Status: AC

## 2017-06-04 MED FILL — Propofol IV Emul 200 MG/20ML (10 MG/ML): INTRAVENOUS | Qty: 20 | Status: AC

## 2017-06-04 MED FILL — Ondansetron HCl Inj 4 MG/2ML (2 MG/ML): INTRAMUSCULAR | Qty: 2 | Status: AC

## 2017-06-20 ENCOUNTER — Other Ambulatory Visit (HOSPITAL_COMMUNITY): Payer: Self-pay | Admitting: Family Medicine

## 2017-06-30 ENCOUNTER — Other Ambulatory Visit (HOSPITAL_COMMUNITY): Payer: Self-pay | Admitting: Family Medicine

## 2017-06-30 DIAGNOSIS — F17219 Nicotine dependence, cigarettes, with unspecified nicotine-induced disorders: Secondary | ICD-10-CM

## 2017-06-30 DIAGNOSIS — Z8505 Personal history of malignant neoplasm of liver: Secondary | ICD-10-CM

## 2017-06-30 DIAGNOSIS — C3490 Malignant neoplasm of unspecified part of unspecified bronchus or lung: Secondary | ICD-10-CM

## 2017-07-09 DIAGNOSIS — R634 Abnormal weight loss: Secondary | ICD-10-CM | POA: Diagnosis not present

## 2017-07-09 DIAGNOSIS — R7309 Other abnormal glucose: Secondary | ICD-10-CM | POA: Diagnosis not present

## 2017-07-09 DIAGNOSIS — J449 Chronic obstructive pulmonary disease, unspecified: Secondary | ICD-10-CM | POA: Diagnosis not present

## 2017-07-09 DIAGNOSIS — F0390 Unspecified dementia without behavioral disturbance: Secondary | ICD-10-CM | POA: Diagnosis not present

## 2017-07-09 DIAGNOSIS — I10 Essential (primary) hypertension: Secondary | ICD-10-CM | POA: Diagnosis not present

## 2017-07-09 DIAGNOSIS — F209 Schizophrenia, unspecified: Secondary | ICD-10-CM | POA: Diagnosis not present

## 2017-07-09 DIAGNOSIS — K7689 Other specified diseases of liver: Secondary | ICD-10-CM | POA: Diagnosis not present

## 2017-07-17 ENCOUNTER — Emergency Department (HOSPITAL_COMMUNITY): Payer: Medicare Other

## 2017-07-17 ENCOUNTER — Encounter (HOSPITAL_COMMUNITY): Payer: Self-pay

## 2017-07-17 ENCOUNTER — Emergency Department (HOSPITAL_COMMUNITY)
Admission: EM | Admit: 2017-07-17 | Discharge: 2017-07-17 | Disposition: A | Discharge status: Home / Self Care | Payer: Medicare Other | Attending: Emergency Medicine | Admitting: Emergency Medicine

## 2017-07-17 DIAGNOSIS — Z79899 Other long term (current) drug therapy: Secondary | ICD-10-CM | POA: Diagnosis not present

## 2017-07-17 DIAGNOSIS — I10 Essential (primary) hypertension: Secondary | ICD-10-CM | POA: Diagnosis not present

## 2017-07-17 DIAGNOSIS — R197 Diarrhea, unspecified: Secondary | ICD-10-CM | POA: Diagnosis not present

## 2017-07-17 DIAGNOSIS — Z87891 Personal history of nicotine dependence: Secondary | ICD-10-CM | POA: Insufficient documentation

## 2017-07-17 DIAGNOSIS — F0390 Unspecified dementia without behavioral disturbance: Secondary | ICD-10-CM | POA: Diagnosis not present

## 2017-07-17 MED ORDER — LOPERAMIDE HCL 2 MG PO CAPS
2.0000 mg | ORAL_CAPSULE | Freq: Once | ORAL | Status: AC
  Administered 2017-07-17: 2 mg via ORAL
  Filled 2017-07-17: qty 1

## 2017-07-17 MED ORDER — LOPERAMIDE HCL 2 MG PO CAPS
4.0000 mg | ORAL_CAPSULE | Freq: Once | ORAL | Status: AC
  Administered 2017-07-17: 4 mg via ORAL
  Filled 2017-07-17: qty 2

## 2017-07-17 MED ORDER — SODIUM CHLORIDE 0.9 % IV BOLUS: 1000.0000 mL | Freq: Once | INTRAVENOUS | Status: DC
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
## 2017-07-17 NOTE — Discharge Instructions (Addendum)
Give her plenty of fluids. Avoid milk or milk products because they will make the diarrhea worse. Give her imodium-D OTC for her diarrhea. Recheck if she seems worse again.

## 2017-07-17 NOTE — ED Triage Notes (Signed)
Pt in with daughter who states the patient has been having diarrhea since approx 9pm last night, multiple stools.  Daughter gave pepto one time for same.  Daughter reports pt has otherwise been acting normal.

## 2017-07-23 ENCOUNTER — Encounter (HOSPITAL_COMMUNITY): Payer: Self-pay

## 2017-07-23 NOTE — Progress Notes (Signed)
PCP:  [name], mD  Cardiologist: denies  EKG: 12/23/16 in [facility]  Stress test: denies  ECHO: 03/16/15 in EPIC  Cardiac Cath: denies  Chest x-ray: 1-view 12/23/16 in [place] is patient's legal guardian and will be with her tomorrow morning for surgery

## 2017-07-24 ENCOUNTER — Other Ambulatory Visit: Payer: Self-pay

## 2017-07-24 ENCOUNTER — Ambulatory Visit (HOSPITAL_COMMUNITY)
Admission: RE | Admit: 2017-07-24 | Discharge: 2017-07-24 | Disposition: A | Discharge status: Home / Self Care | Payer: Medicare Other | Source: Ambulatory Visit | Attending: Family Medicine | Admitting: Family Medicine

## 2017-07-24 ENCOUNTER — Encounter (HOSPITAL_COMMUNITY): Payer: Self-pay | Admitting: Certified Registered"

## 2017-07-24 ENCOUNTER — Ambulatory Visit (HOSPITAL_COMMUNITY): Payer: Medicare Other | Admitting: Certified Registered"

## 2017-07-24 ENCOUNTER — Encounter (HOSPITAL_COMMUNITY)
Admission: RE | Disposition: A | Discharge status: Home / Self Care | Payer: Self-pay | Source: Ambulatory Visit | Attending: Family Medicine

## 2017-07-24 DIAGNOSIS — Z7984 Long term (current) use of oral hypoglycemic drugs: Secondary | ICD-10-CM | POA: Insufficient documentation

## 2017-07-24 DIAGNOSIS — I700 Atherosclerosis of aorta: Secondary | ICD-10-CM | POA: Insufficient documentation

## 2017-07-24 DIAGNOSIS — Z79899 Other long term (current) drug therapy: Secondary | ICD-10-CM | POA: Insufficient documentation

## 2017-07-24 DIAGNOSIS — E278 Other specified disorders of adrenal gland: Secondary | ICD-10-CM | POA: Insufficient documentation

## 2017-07-24 DIAGNOSIS — I2510 Atherosclerotic heart disease of native coronary artery without angina pectoris: Secondary | ICD-10-CM | POA: Insufficient documentation

## 2017-07-24 DIAGNOSIS — R220 Localized swelling, mass and lump, head: Secondary | ICD-10-CM | POA: Insufficient documentation

## 2017-07-24 DIAGNOSIS — C3490 Malignant neoplasm of unspecified part of unspecified bronchus or lung: Secondary | ICD-10-CM

## 2017-07-24 DIAGNOSIS — I10 Essential (primary) hypertension: Secondary | ICD-10-CM | POA: Insufficient documentation

## 2017-07-24 DIAGNOSIS — R221 Localized swelling, mass and lump, neck: Secondary | ICD-10-CM | POA: Diagnosis not present

## 2017-07-24 DIAGNOSIS — F17219 Nicotine dependence, cigarettes, with unspecified nicotine-induced disorders: Secondary | ICD-10-CM

## 2017-07-24 DIAGNOSIS — R160 Hepatomegaly, not elsewhere classified: Secondary | ICD-10-CM | POA: Diagnosis not present

## 2017-07-24 DIAGNOSIS — J439 Emphysema, unspecified: Secondary | ICD-10-CM | POA: Insufficient documentation

## 2017-07-24 DIAGNOSIS — C3400 Malignant neoplasm of unspecified main bronchus: Secondary | ICD-10-CM | POA: Diagnosis not present

## 2017-07-24 DIAGNOSIS — Z87891 Personal history of nicotine dependence: Secondary | ICD-10-CM | POA: Insufficient documentation

## 2017-07-24 DIAGNOSIS — E876 Hypokalemia: Secondary | ICD-10-CM | POA: Diagnosis not present

## 2017-07-24 DIAGNOSIS — Z8505 Personal history of malignant neoplasm of liver: Secondary | ICD-10-CM

## 2017-07-24 DIAGNOSIS — D490 Neoplasm of unspecified behavior of digestive system: Secondary | ICD-10-CM | POA: Diagnosis not present

## 2017-07-24 HISTORY — PX: RADIOLOGY WITH ANESTHESIA: SHX6223

## 2017-07-24 LAB — BASIC METABOLIC PANEL
Anion gap: 9 (ref 5–15)
BUN: 18 mg/dL (ref 6–20)
CALCIUM: 9 mg/dL (ref 8.9–10.3)
CO2: 27 mmol/L (ref 22–32)
CREATININE: 0.72 mg/dL (ref 0.44–1.00)
Chloride: 107 mmol/L (ref 101–111)
GFR calc non Af Amer: >60 mL/min (ref >60)
Glucose, Bld: 81 mg/dL (ref 65–99)
Potassium: 4.1 mmol/L (ref 3.5–5.1)
SODIUM: 143 mmol/L (ref 135–145)

## 2017-07-24 LAB — CBC
HCT: 38.2 % (ref 36.0–46.0)
Hemoglobin: 12.3 g/dL (ref 12.0–15.0)
MCH: 29.2 pg (ref 26.0–34.0)
MCHC: 32.2 g/dL (ref 30.0–36.0)
MCV: 90.7 fL (ref 78.0–100.0)
PLATELETS: 265 10*3/uL (ref 150–400)
RBC: 4.21 MIL/uL (ref 3.87–5.11)
RDW: 13.7 % (ref 11.5–15.5)
WBC: 9.1 10*3/uL (ref 4.0–10.5)

## 2017-07-24 LAB — POCT I-STAT, CHEM 8
BUN: 19 mg/dL (ref 6–20)
CALCIUM ION: 1.24 mmol/L (ref 1.15–1.40)
Chloride: 105 mmol/L (ref 101–111)
Creatinine, Ser: 0.6 mg/dL (ref 0.44–1.00)
Glucose, Bld: 86 mg/dL (ref 65–99)
HCT: 29 % — ABNORMAL LOW (ref 36.0–46.0)
Hemoglobin: 9.9 g/dL — ABNORMAL LOW (ref 12.0–15.0)
Potassium: 3.8 mmol/L (ref 3.5–5.1)
SODIUM: 143 mmol/L (ref 135–145)
TCO2: 24 mmol/L (ref 22–32)

## 2017-07-24 LAB — HEMOGLOBIN A1C
HEMOGLOBIN A1C: 5.7 % — AB (ref 4.8–5.6)
Mean Plasma Glucose: 116.89 mg/dL

## 2017-07-24 SURGERY — CT WITH ANESTHESIA
Anesthesia: General

## 2017-07-24 MED ORDER — LACTATED RINGERS IV SOLN
INTRAVENOUS | Status: DC
  Administered 2017-07-24: 10:00:00 via INTRAVENOUS

## 2017-07-24 MED ORDER — IOHEXOL 300 MG/ML  SOLN
100.0000 mL | Freq: Once | INTRAMUSCULAR | Status: AC | PRN
  Administered 2017-07-24: 100 mL via INTRAVENOUS

## 2017-07-24 MED ORDER — PROPOFOL 10 MG/ML IV BOLUS
INTRAVENOUS | Status: DC | PRN
  Administered 2017-07-24: 100 mg via INTRAVENOUS

## 2017-07-24 MED ORDER — KETAMINE HCL 100 MG/ML IJ SOLN
INTRAMUSCULAR | Status: AC
  Filled 2017-07-24: qty 1

## 2017-07-24 NOTE — Transfer of Care (Signed)
Immediate Anesthesia Transfer of Care Note  Patient: Brittney Wall  Procedure(s) Performed: CT WITH ANESTHESIA OF CHEST WITH CONTRAST,SOFT TISSUE OF NECK WITH CONTAST (N/A )  Patient Location: PACU  Anesthesia Type:General  Level of Consciousness: confused  Airway & Oxygen Therapy: Patient Spontanous Breathing and Patient connected to nasal cannula oxygen  Post-op Assessment: Report given to RN, Post -op Vital signs reviewed and stable and Patient moving all extremities  Post vital signs: Reviewed and stable  Last Vitals:  Vitals Value Taken Time  BP 206/96 07/24/2017 11:31 AM  Temp    Pulse 123 07/24/2017 11:28 AM  Resp 20 07/24/2017 11:32 AM  SpO2 91 % 07/24/2017 11:28 AM  Vitals shown include unvalidated device data.  Last Pain: There were no vitals filed for this visit.       Complications: No apparent anesthesia complications

## 2017-07-24 NOTE — Anesthesia Preprocedure Evaluation (Addendum)
Anesthesia Evaluation  Patient identified by MRN, date of birth, ID band Patient confused    Reviewed: Unable to perform ROS - Chart review only  Airway Mallampati: II  TM Distance: >3 FB Neck ROM: Full    Dental  (+) Edentulous Upper, Edentulous Lower   Pulmonary former smoker,    breath sounds clear to auscultation       Cardiovascular hypertension,  Rhythm:Regular Rate:Normal     Neuro/Psych    GI/Hepatic   Endo/Other    Renal/GU      Musculoskeletal   Abdominal   Peds  Hematology   Anesthesia Other Findings   Reproductive/Obstetrics                            Anesthesia Physical Anesthesia Plan  ASA: III  Anesthesia Plan: General   Post-op Pain Management:    Induction:   PONV Risk Score and Plan: Ondansetron  Airway Management Planned: LMA  Additional Equipment:   Intra-op Plan:   Post-operative Plan:   Informed Consent: I have reviewed the patients History and Physical, chart, labs and discussed the procedure including the risks, benefits and alternatives for the proposed anesthesia with the patient or authorized representative who has indicated his/her understanding and acceptance.     Plan Discussed with: CRNA and Anesthesiologist  Anesthesia Plan Comments:        Anesthesia Quick Evaluation

## 2017-07-24 NOTE — Anesthesia Postprocedure Evaluation (Signed)
Anesthesia Post Note  Patient: Brittney Wall  Procedure(s) Performed: CT WITH ANESTHESIA OF CHEST WITH CONTRAST,SOFT TISSUE OF NECK WITH CONTAST (N/A )     Patient location during evaluation: PACU Anesthesia Type: General Level of consciousness: confused Pain management: pain level controlled Vital Signs Assessment: post-procedure vital signs reviewed and stable Respiratory status: spontaneous breathing, nonlabored ventilation, respiratory function stable and patient connected to nasal cannula oxygen Cardiovascular status: blood pressure returned to baseline and stable Postop Assessment: no apparent nausea or vomiting Anesthetic complications: no    Last Vitals:  Vitals:   07/24/17 1124 07/24/17 1130  BP: (!) 187/96 (!) 206/96  Pulse:  (!) 123  Resp: 19 18  Temp:  (!) 36.2 C  SpO2: 98% 96%    Last Pain: There were no vitals filed for this visit.               Aman Batley COKER

## 2017-07-24 NOTE — Anesthesia Procedure Notes (Signed)
Procedure Name: LMA Insertion Date/Time: 07/24/2017 10:07 AM Performed by: Moshe Salisbury, CRNA Pre-anesthesia Checklist: Patient identified, Emergency Drugs available, Suction available and Patient being monitored Patient Re-evaluated:Patient Re-evaluated prior to induction Oxygen Delivery Method: Circle System Utilized Preoxygenation: Pre-oxygenation with 100% oxygen Induction Type: IV induction Ventilation: Mask ventilation without difficulty LMA: LMA inserted LMA Size: 4.0 Number of attempts: 1 Placement Confirmation: positive ETCO2 Tube secured with: Tape Dental Injury: Teeth and Oropharynx as per pre-operative assessment

## 2017-07-25 ENCOUNTER — Encounter (HOSPITAL_COMMUNITY): Payer: Self-pay | Admitting: Radiology

## 2017-07-29 DIAGNOSIS — I10 Essential (primary) hypertension: Secondary | ICD-10-CM | POA: Diagnosis not present

## 2017-07-29 DIAGNOSIS — R634 Abnormal weight loss: Secondary | ICD-10-CM | POA: Diagnosis not present

## 2017-07-29 DIAGNOSIS — J449 Chronic obstructive pulmonary disease, unspecified: Secondary | ICD-10-CM | POA: Diagnosis not present

## 2017-07-29 DIAGNOSIS — F209 Schizophrenia, unspecified: Secondary | ICD-10-CM | POA: Diagnosis not present

## 2017-08-29 ENCOUNTER — Inpatient Hospital Stay (HOSPITAL_COMMUNITY)
Admission: EM | Admit: 2017-08-29 | Discharge: 2017-09-03 | DRG: 177 | Disposition: A | Discharge status: Hospice | Payer: Medicare Other | Attending: Internal Medicine | Admitting: Internal Medicine

## 2017-08-29 ENCOUNTER — Other Ambulatory Visit: Payer: Self-pay

## 2017-08-29 ENCOUNTER — Emergency Department (HOSPITAL_COMMUNITY): Payer: Medicare Other

## 2017-08-29 ENCOUNTER — Encounter (HOSPITAL_COMMUNITY): Payer: Self-pay | Admitting: Emergency Medicine

## 2017-08-29 DIAGNOSIS — C787 Secondary malignant neoplasm of liver and intrahepatic bile duct: Secondary | ICD-10-CM

## 2017-08-29 DIAGNOSIS — R64 Cachexia: Secondary | ICD-10-CM | POA: Diagnosis not present

## 2017-08-29 DIAGNOSIS — E119 Type 2 diabetes mellitus without complications: Secondary | ICD-10-CM

## 2017-08-29 DIAGNOSIS — F209 Schizophrenia, unspecified: Secondary | ICD-10-CM | POA: Diagnosis present

## 2017-08-29 DIAGNOSIS — I10 Essential (primary) hypertension: Secondary | ICD-10-CM | POA: Diagnosis not present

## 2017-08-29 DIAGNOSIS — F250 Schizoaffective disorder, bipolar type: Secondary | ICD-10-CM | POA: Diagnosis present

## 2017-08-29 DIAGNOSIS — R0902 Hypoxemia: Secondary | ICD-10-CM | POA: Diagnosis not present

## 2017-08-29 DIAGNOSIS — J189 Pneumonia, unspecified organism: Secondary | ICD-10-CM | POA: Diagnosis not present

## 2017-08-29 DIAGNOSIS — R6520 Severe sepsis without septic shock: Secondary | ICD-10-CM | POA: Diagnosis not present

## 2017-08-29 DIAGNOSIS — E872 Acidosis, unspecified: Secondary | ICD-10-CM

## 2017-08-29 DIAGNOSIS — Z66 Do not resuscitate: Secondary | ICD-10-CM | POA: Diagnosis not present

## 2017-08-29 DIAGNOSIS — Z7189 Other specified counseling: Secondary | ICD-10-CM

## 2017-08-29 DIAGNOSIS — R0602 Shortness of breath: Secondary | ICD-10-CM | POA: Diagnosis not present

## 2017-08-29 DIAGNOSIS — C7802 Secondary malignant neoplasm of left lung: Secondary | ICD-10-CM | POA: Diagnosis present

## 2017-08-29 DIAGNOSIS — F0390 Unspecified dementia without behavioral disturbance: Secondary | ICD-10-CM

## 2017-08-29 DIAGNOSIS — Z87891 Personal history of nicotine dependence: Secondary | ICD-10-CM

## 2017-08-29 DIAGNOSIS — R627 Adult failure to thrive: Secondary | ICD-10-CM | POA: Diagnosis present

## 2017-08-29 DIAGNOSIS — I160 Hypertensive urgency: Secondary | ICD-10-CM | POA: Diagnosis not present

## 2017-08-29 DIAGNOSIS — Z7984 Long term (current) use of oral hypoglycemic drugs: Secondary | ICD-10-CM

## 2017-08-29 DIAGNOSIS — Z79899 Other long term (current) drug therapy: Secondary | ICD-10-CM

## 2017-08-29 DIAGNOSIS — I119 Hypertensive heart disease without heart failure: Secondary | ICD-10-CM | POA: Diagnosis present

## 2017-08-29 DIAGNOSIS — J449 Chronic obstructive pulmonary disease, unspecified: Secondary | ICD-10-CM

## 2017-08-29 DIAGNOSIS — E785 Hyperlipidemia, unspecified: Secondary | ICD-10-CM

## 2017-08-29 DIAGNOSIS — Z515 Encounter for palliative care: Secondary | ICD-10-CM | POA: Diagnosis not present

## 2017-08-29 DIAGNOSIS — C7972 Secondary malignant neoplasm of left adrenal gland: Secondary | ICD-10-CM | POA: Diagnosis present

## 2017-08-29 DIAGNOSIS — Z681 Body mass index (BMI) 19 or less, adult: Secondary | ICD-10-CM

## 2017-08-29 DIAGNOSIS — C7971 Secondary malignant neoplasm of right adrenal gland: Secondary | ICD-10-CM | POA: Diagnosis present

## 2017-08-29 DIAGNOSIS — E876 Hypokalemia: Secondary | ICD-10-CM | POA: Diagnosis present

## 2017-08-29 DIAGNOSIS — E042 Nontoxic multinodular goiter: Secondary | ICD-10-CM | POA: Diagnosis present

## 2017-08-29 DIAGNOSIS — Z9049 Acquired absence of other specified parts of digestive tract: Secondary | ICD-10-CM

## 2017-08-29 DIAGNOSIS — R05 Cough: Secondary | ICD-10-CM | POA: Diagnosis not present

## 2017-08-29 DIAGNOSIS — C7801 Secondary malignant neoplasm of right lung: Secondary | ICD-10-CM | POA: Diagnosis present

## 2017-08-29 DIAGNOSIS — J690 Pneumonitis due to inhalation of food and vomit: Secondary | ICD-10-CM | POA: Diagnosis not present

## 2017-08-29 DIAGNOSIS — A419 Sepsis, unspecified organism: Secondary | ICD-10-CM

## 2017-08-29 DIAGNOSIS — R531 Weakness: Secondary | ICD-10-CM | POA: Diagnosis not present

## 2017-08-29 DIAGNOSIS — R011 Cardiac murmur, unspecified: Secondary | ICD-10-CM | POA: Diagnosis present

## 2017-08-29 DIAGNOSIS — J439 Emphysema, unspecified: Secondary | ICD-10-CM | POA: Diagnosis present

## 2017-08-29 DIAGNOSIS — E43 Unspecified severe protein-calorie malnutrition: Secondary | ICD-10-CM | POA: Diagnosis not present

## 2017-08-29 DIAGNOSIS — C801 Malignant (primary) neoplasm, unspecified: Secondary | ICD-10-CM | POA: Diagnosis present

## 2017-08-29 DIAGNOSIS — R000 Tachycardia, unspecified: Secondary | ICD-10-CM | POA: Diagnosis present

## 2017-08-29 DIAGNOSIS — F0391 Unspecified dementia with behavioral disturbance: Secondary | ICD-10-CM | POA: Diagnosis present

## 2017-08-29 DIAGNOSIS — R404 Transient alteration of awareness: Secondary | ICD-10-CM | POA: Diagnosis not present

## 2017-08-29 DIAGNOSIS — Z7401 Bed confinement status: Secondary | ICD-10-CM

## 2017-08-29 DIAGNOSIS — C7800 Secondary malignant neoplasm of unspecified lung: Secondary | ICD-10-CM

## 2017-08-29 DIAGNOSIS — E278 Other specified disorders of adrenal gland: Secondary | ICD-10-CM | POA: Diagnosis present

## 2017-08-29 DIAGNOSIS — E1165 Type 2 diabetes mellitus with hyperglycemia: Secondary | ICD-10-CM | POA: Diagnosis present

## 2017-08-29 LAB — CBC WITH DIFFERENTIAL/PLATELET
BASOS ABS: 0 10*3/uL (ref 0.0–0.1)
BASOS PCT: 0 %
Eosinophils Absolute: 0 10*3/uL (ref 0.0–0.7)
Eosinophils Relative: 0 %
HEMATOCRIT: 41.3 % (ref 36.0–46.0)
HEMOGLOBIN: 14 g/dL (ref 12.0–15.0)
LYMPHS PCT: 3 %
Lymphs Abs: 0.5 10*3/uL — ABNORMAL LOW (ref 0.7–4.0)
MCH: 30.2 pg (ref 26.0–34.0)
MCHC: 33.9 g/dL (ref 30.0–36.0)
MCV: 89 fL (ref 78.0–100.0)
Monocytes Absolute: 0.4 10*3/uL (ref 0.1–1.0)
Monocytes Relative: 3 %
NEUTROS ABS: 12.6 10*3/uL — AB (ref 1.7–7.7)
Neutrophils Relative %: 94 %
Platelets: 218 10*3/uL (ref 150–400)
RBC: 4.64 MIL/uL (ref 3.87–5.11)
RDW: 14.5 % (ref 11.5–15.5)
WBC: 13.5 10*3/uL — ABNORMAL HIGH (ref 4.0–10.5)

## 2017-08-29 LAB — COMPREHENSIVE METABOLIC PANEL
ALK PHOS: 80 U/L (ref 38–126)
ALT: 23 U/L (ref 0–44)
ANION GAP: 10 (ref 5–15)
AST: 53 U/L — ABNORMAL HIGH (ref 15–41)
Albumin: 3.8 g/dL (ref 3.5–5.0)
BILIRUBIN TOTAL: 1.2 mg/dL (ref 0.3–1.2)
BUN: 38 mg/dL — ABNORMAL HIGH (ref 8–23)
CALCIUM: 9.2 mg/dL (ref 8.9–10.3)
CO2: 25 mmol/L (ref 22–32)
Chloride: 99 mmol/L (ref 98–111)
Creatinine, Ser: 0.85 mg/dL (ref 0.44–1.00)
GFR calc non Af Amer: >60 mL/min (ref >60)
Glucose, Bld: 189 mg/dL — ABNORMAL HIGH (ref 70–99)
POTASSIUM: 3.1 mmol/L — AB (ref 3.5–5.1)
SODIUM: 134 mmol/L — AB (ref 135–145)
TOTAL PROTEIN: 8.2 g/dL — AB (ref 6.5–8.1)

## 2017-08-29 LAB — I-STAT CG4 LACTIC ACID, ED: LACTIC ACID, VENOUS: 2.52 mmol/L — AB (ref 0.5–1.9)

## 2017-08-29 MED ORDER — SODIUM CHLORIDE 0.9 % IV BOLUS (SEPSIS)
1000.0000 mL | Freq: Once | INTRAVENOUS | Status: AC
  Administered 2017-08-29: 1000 mL via INTRAVENOUS

## 2017-08-29 MED ORDER — ALBUTEROL SULFATE (2.5 MG/3ML) 0.083% IN NEBU
5.0000 mg | INHALATION_SOLUTION | Freq: Once | RESPIRATORY_TRACT | Status: AC
  Administered 2017-08-29: 5 mg via RESPIRATORY_TRACT
  Filled 2017-08-29: qty 6

## 2017-08-29 MED ORDER — SODIUM CHLORIDE 0.9 % IV SOLN
500.0000 mg | INTRAVENOUS | Status: DC
  Administered 2017-08-29: 500 mg via INTRAVENOUS
  Filled 2017-08-29: qty 500

## 2017-08-29 MED ORDER — SODIUM CHLORIDE 0.9 % IV BOLUS (SEPSIS)
500.0000 mL | Freq: Once | INTRAVENOUS | Status: AC
  Administered 2017-08-29: 500 mL via INTRAVENOUS

## 2017-08-29 MED ORDER — SODIUM CHLORIDE 0.9 % IV SOLN
2.0000 g | INTRAVENOUS | Status: DC
  Administered 2017-08-29: 2 g via INTRAVENOUS
  Filled 2017-08-29: qty 20

## 2017-08-29 NOTE — ED Notes (Signed)
Pt mildy restless. Pulling iv lines and taking 02 off. Daughter at bedside watching pt at this time. AC aware of needing sitter.

## 2017-08-29 NOTE — ED Notes (Signed)
Lab at bedside

## 2017-08-29 NOTE — ED Triage Notes (Signed)
[facility] called to home  Family reports pt is non ambulatory for the last several hours  Pt had low O2 sat  Pt In Hall 2 clapping hands  She appears to be short of breath

## 2017-08-29 NOTE — ED Notes (Signed)
Report to Christy RN 

## 2017-08-29 NOTE — ED Notes (Signed)
02 2L  applied. Pt 91% ra.

## 2017-08-29 NOTE — ED Notes (Signed)
Lactic Acid  2.52  Dr Delane Ginger informed

## 2017-08-29 NOTE — ED Notes (Signed)
Treatment in progress

## 2017-08-29 NOTE — ED Notes (Signed)
Pt eating crackers and drinking soda  She has tried to eat rad tech badge and is putting pulse ox in mouth

## 2017-08-29 NOTE — ED Notes (Signed)
IV attempt x 2 unsuccessful due to movement and distress

## 2017-08-29 NOTE — ED Notes (Signed)
Attempt to place pulse ox on finger  Pt put it in mouth and tried to chew it

## 2017-08-29 NOTE — ED Notes (Signed)
IV est after sticks x 4 Pt is agitated and picking at IV, cardiac monitor lines, and BP cuff as well as pulse ox  She is taking pos well having had 3 drinks, cookie and crackers Family is at bedisde  They report she is feeling badly as she usually is more agitated than now  UA to establish 2nd IV

## 2017-08-29 NOTE — ED Notes (Signed)
Daughter to bedside  Reports that pt has been with her Aunt this week and she does not know what has transpired  Pt with history of Schizophrenia as well as dementia

## 2017-08-29 NOTE — ED Notes (Signed)
Breathing treatment in process

## 2017-08-30 DIAGNOSIS — Z66 Do not resuscitate: Secondary | ICD-10-CM | POA: Diagnosis present

## 2017-08-30 DIAGNOSIS — Z515 Encounter for palliative care: Secondary | ICD-10-CM | POA: Diagnosis not present

## 2017-08-30 DIAGNOSIS — R64 Cachexia: Secondary | ICD-10-CM | POA: Diagnosis present

## 2017-08-30 DIAGNOSIS — C7801 Secondary malignant neoplasm of right lung: Secondary | ICD-10-CM | POA: Diagnosis present

## 2017-08-30 DIAGNOSIS — E785 Hyperlipidemia, unspecified: Secondary | ICD-10-CM | POA: Diagnosis present

## 2017-08-30 DIAGNOSIS — J9811 Atelectasis: Secondary | ICD-10-CM | POA: Diagnosis not present

## 2017-08-30 DIAGNOSIS — F209 Schizophrenia, unspecified: Secondary | ICD-10-CM | POA: Diagnosis not present

## 2017-08-30 DIAGNOSIS — R000 Tachycardia, unspecified: Secondary | ICD-10-CM | POA: Diagnosis present

## 2017-08-30 DIAGNOSIS — C801 Malignant (primary) neoplasm, unspecified: Secondary | ICD-10-CM | POA: Diagnosis not present

## 2017-08-30 DIAGNOSIS — J439 Emphysema, unspecified: Secondary | ICD-10-CM | POA: Diagnosis present

## 2017-08-30 DIAGNOSIS — J690 Pneumonitis due to inhalation of food and vomit: Secondary | ICD-10-CM | POA: Diagnosis present

## 2017-08-30 DIAGNOSIS — I10 Essential (primary) hypertension: Secondary | ICD-10-CM | POA: Diagnosis not present

## 2017-08-30 DIAGNOSIS — F250 Schizoaffective disorder, bipolar type: Secondary | ICD-10-CM | POA: Diagnosis present

## 2017-08-30 DIAGNOSIS — R0602 Shortness of breath: Secondary | ICD-10-CM | POA: Diagnosis not present

## 2017-08-30 DIAGNOSIS — J189 Pneumonia, unspecified organism: Secondary | ICD-10-CM | POA: Diagnosis not present

## 2017-08-30 DIAGNOSIS — F0391 Unspecified dementia with behavioral disturbance: Secondary | ICD-10-CM | POA: Diagnosis present

## 2017-08-30 DIAGNOSIS — C7802 Secondary malignant neoplasm of left lung: Secondary | ICD-10-CM | POA: Diagnosis present

## 2017-08-30 DIAGNOSIS — I119 Hypertensive heart disease without heart failure: Secondary | ICD-10-CM | POA: Diagnosis present

## 2017-08-30 DIAGNOSIS — E042 Nontoxic multinodular goiter: Secondary | ICD-10-CM | POA: Diagnosis present

## 2017-08-30 DIAGNOSIS — E1165 Type 2 diabetes mellitus with hyperglycemia: Secondary | ICD-10-CM | POA: Diagnosis present

## 2017-08-30 DIAGNOSIS — C7972 Secondary malignant neoplasm of left adrenal gland: Secondary | ICD-10-CM | POA: Diagnosis present

## 2017-08-30 DIAGNOSIS — E872 Acidosis, unspecified: Secondary | ICD-10-CM

## 2017-08-30 DIAGNOSIS — E43 Unspecified severe protein-calorie malnutrition: Secondary | ICD-10-CM | POA: Diagnosis present

## 2017-08-30 DIAGNOSIS — Z87891 Personal history of nicotine dependence: Secondary | ICD-10-CM | POA: Diagnosis not present

## 2017-08-30 DIAGNOSIS — R627 Adult failure to thrive: Secondary | ICD-10-CM | POA: Diagnosis present

## 2017-08-30 DIAGNOSIS — J449 Chronic obstructive pulmonary disease, unspecified: Secondary | ICD-10-CM | POA: Diagnosis not present

## 2017-08-30 DIAGNOSIS — C7800 Secondary malignant neoplasm of unspecified lung: Secondary | ICD-10-CM

## 2017-08-30 DIAGNOSIS — F0281 Dementia in other diseases classified elsewhere with behavioral disturbance: Secondary | ICD-10-CM | POA: Diagnosis not present

## 2017-08-30 DIAGNOSIS — E118 Type 2 diabetes mellitus with unspecified complications: Secondary | ICD-10-CM | POA: Diagnosis not present

## 2017-08-30 DIAGNOSIS — I160 Hypertensive urgency: Secondary | ICD-10-CM | POA: Diagnosis not present

## 2017-08-30 DIAGNOSIS — C7971 Secondary malignant neoplasm of right adrenal gland: Secondary | ICD-10-CM | POA: Diagnosis present

## 2017-08-30 DIAGNOSIS — E279 Disorder of adrenal gland, unspecified: Secondary | ICD-10-CM | POA: Diagnosis not present

## 2017-08-30 DIAGNOSIS — E119 Type 2 diabetes mellitus without complications: Secondary | ICD-10-CM

## 2017-08-30 DIAGNOSIS — Z7189 Other specified counseling: Secondary | ICD-10-CM | POA: Diagnosis not present

## 2017-08-30 DIAGNOSIS — Z681 Body mass index (BMI) 19 or less, adult: Secondary | ICD-10-CM | POA: Diagnosis not present

## 2017-08-30 DIAGNOSIS — T17908S Unspecified foreign body in respiratory tract, part unspecified causing other injury, sequela: Secondary | ICD-10-CM | POA: Diagnosis not present

## 2017-08-30 DIAGNOSIS — E876 Hypokalemia: Secondary | ICD-10-CM | POA: Diagnosis not present

## 2017-08-30 DIAGNOSIS — C787 Secondary malignant neoplasm of liver and intrahepatic bile duct: Secondary | ICD-10-CM

## 2017-08-30 LAB — LACTIC ACID, PLASMA: Lactic Acid, Venous: 1.3 mmol/L (ref 0.5–1.9)

## 2017-08-30 LAB — URINALYSIS, ROUTINE W REFLEX MICROSCOPIC
BILIRUBIN URINE: NEGATIVE
Glucose, UA: >=500 mg/dL — AB
KETONES UR: NEGATIVE mg/dL
LEUKOCYTES UA: NEGATIVE
Nitrite: NEGATIVE
Protein, ur: 100 mg/dL — AB
Specific Gravity, Urine: 1.013 (ref 1.005–1.030)
pH: 6 (ref 5.0–8.0)

## 2017-08-30 LAB — MAGNESIUM: Magnesium: 2 mg/dL (ref 1.7–2.4)

## 2017-08-30 LAB — GLUCOSE, CAPILLARY
GLUCOSE-CAPILLARY: 186 mg/dL — AB (ref 70–99)
GLUCOSE-CAPILLARY: 134 mg/dL — AB (ref 70–99)
Glucose-Capillary: 284 mg/dL — ABNORMAL HIGH (ref 70–99)
Glucose-Capillary: 91 mg/dL (ref 70–99)
Glucose-Capillary: 112 mg/dL — ABNORMAL HIGH (ref 70–99)

## 2017-08-30 LAB — I-STAT CG4 LACTIC ACID, ED: LACTIC ACID, VENOUS: 3.01 mmol/L — AB (ref 0.5–1.9)

## 2017-08-30 LAB — PROCALCITONIN: Procalcitonin: 6.64 ng/mL

## 2017-08-30 MED ORDER — HYDROCODONE-ACETAMINOPHEN 7.5-325 MG/15ML PO SOLN
5.0000 mL | Freq: Four times a day (QID) | ORAL | Status: DC | PRN
  Administered 2017-08-30 – 2017-09-03 (×9): 5 mL via ORAL
  Filled 2017-08-30 (×9): qty 15

## 2017-08-30 MED ORDER — ONDANSETRON HCL 4 MG PO TABS: 4.0000 mg | ORAL_TABLET | Freq: Four times a day (QID) | ORAL | Status: DC | PRN

## 2017-08-30 MED ORDER — PRAVASTATIN SODIUM 40 MG PO TABS
20.0000 mg | ORAL_TABLET | Freq: Every day | ORAL | Status: DC
  Administered 2017-08-31 – 2017-09-02 (×3): 20 mg via ORAL
  Filled 2017-08-30 (×6): qty 1

## 2017-08-30 MED ORDER — ACETYLCYSTEINE 20 % IN SOLN
2.0000 mL | Freq: Two times a day (BID) | RESPIRATORY_TRACT | Status: DC
  Administered 2017-08-30 – 2017-09-03 (×9): 2 mL via RESPIRATORY_TRACT
  Filled 2017-08-30 (×15): qty 4

## 2017-08-30 MED ORDER — LORAZEPAM 2 MG/ML IJ SOLN
0.5000 mg | INTRAMUSCULAR | Status: DC | PRN
  Administered 2017-09-01 – 2017-09-02 (×2): 1 mg via INTRAVENOUS
  Administered 2017-09-02 (×2): 0.5 mg via INTRAVENOUS
  Filled 2017-08-30 (×4): qty 1

## 2017-08-30 MED ORDER — MIRTAZAPINE 30 MG PO TABS
30.0000 mg | ORAL_TABLET | Freq: Every day | ORAL | Status: DC
  Administered 2017-08-30 – 2017-09-02 (×4): 30 mg via ORAL
  Filled 2017-08-30 (×4): qty 1

## 2017-08-30 MED ORDER — HYDRALAZINE HCL 10 MG PO TABS
10.0000 mg | ORAL_TABLET | Freq: Three times a day (TID) | ORAL | Status: DC
  Administered 2017-08-31 – 2017-09-01 (×6): 10 mg via ORAL
  Filled 2017-08-30 (×8): qty 1

## 2017-08-30 MED ORDER — PIPERACILLIN-TAZOBACTAM 3.375 G IVPB
3.3750 g | Freq: Three times a day (TID) | INTRAVENOUS | Status: DC
  Filled 2017-08-30: qty 50

## 2017-08-30 MED ORDER — MINOXIDIL 2.5 MG PO TABS
5.0000 mg | ORAL_TABLET | Freq: Two times a day (BID) | ORAL | Status: DC
  Administered 2017-08-30 – 2017-09-03 (×8): 5 mg via ORAL
  Filled 2017-08-30 (×13): qty 2

## 2017-08-30 MED ORDER — ENOXAPARIN SODIUM 30 MG/0.3ML ~~LOC~~ SOLN
30.0000 mg | SUBCUTANEOUS | Status: DC
  Administered 2017-09-01 – 2017-09-03 (×3): 30 mg via SUBCUTANEOUS
  Filled 2017-08-30 (×3): qty 0.3

## 2017-08-30 MED ORDER — ACETAMINOPHEN 650 MG RE SUPP: 650.0000 mg | Freq: Four times a day (QID) | RECTAL | Status: DC | PRN

## 2017-08-30 MED ORDER — PIPERACILLIN-TAZOBACTAM 3.375 G IVPB
3.3750 g | Freq: Once | INTRAVENOUS | Status: AC
  Administered 2017-08-30: 3.375 g via INTRAVENOUS
  Filled 2017-08-30: qty 50

## 2017-08-30 MED ORDER — HALOPERIDOL 5 MG PO TABS
5.0000 mg | ORAL_TABLET | Freq: Every day | ORAL | Status: DC
  Administered 2017-08-30 – 2017-09-02 (×4): 5 mg via ORAL
  Filled 2017-08-30 (×4): qty 1

## 2017-08-30 MED ORDER — HYDRALAZINE HCL 10 MG PO TABS
20.0000 mg | ORAL_TABLET | Freq: Every day | ORAL | Status: DC
  Administered 2017-08-30: 20 mg via ORAL
  Filled 2017-08-30 (×4): qty 2

## 2017-08-30 MED ORDER — ACETAMINOPHEN 325 MG PO TABS: 650.0000 mg | ORAL_TABLET | Freq: Four times a day (QID) | ORAL | Status: DC | PRN

## 2017-08-30 MED ORDER — BUDESONIDE 0.25 MG/2ML IN SUSP
0.2500 mg | Freq: Two times a day (BID) | RESPIRATORY_TRACT | Status: DC
  Administered 2017-08-30 – 2017-09-03 (×9): 0.25 mg via RESPIRATORY_TRACT
  Filled 2017-08-30 (×7): qty 2

## 2017-08-30 MED ORDER — INSULIN ASPART 100 UNIT/ML ~~LOC~~ SOLN
0.0000 [IU] | SUBCUTANEOUS | Status: DC
  Administered 2017-08-30: 5 [IU] via SUBCUTANEOUS

## 2017-08-30 MED ORDER — ENOXAPARIN SODIUM 40 MG/0.4ML ~~LOC~~ SOLN
40.0000 mg | SUBCUTANEOUS | Status: DC
  Administered 2017-08-30: 40 mg via SUBCUTANEOUS
  Filled 2017-08-30: qty 0.4

## 2017-08-30 MED ORDER — IPRATROPIUM-ALBUTEROL 0.5-2.5 (3) MG/3ML IN SOLN
3.0000 mL | Freq: Four times a day (QID) | RESPIRATORY_TRACT | Status: DC | PRN
Start: 2017-08-30 — End: 2017-09-03
  Administered 2017-08-30 – 2017-08-31 (×3): 3 mL via RESPIRATORY_TRACT
  Filled 2017-08-30 (×3): qty 3

## 2017-08-30 MED ORDER — MORPHINE SULFATE (PF) 2 MG/ML IV SOLN
2.0000 mg | INTRAVENOUS | Status: DC | PRN
  Administered 2017-09-01 (×4): 2 mg via INTRAVENOUS
  Filled 2017-08-30 (×5): qty 1

## 2017-08-30 MED ORDER — LORAZEPAM 0.5 MG PO TABS: 0.5000 mg | ORAL_TABLET | Freq: Every evening | ORAL | Status: DC | PRN

## 2017-08-30 MED ORDER — ONDANSETRON HCL 4 MG/2ML IJ SOLN: 4.0000 mg | Freq: Four times a day (QID) | INTRAMUSCULAR | Status: DC | PRN

## 2017-08-30 MED ORDER — CLONIDINE HCL 0.1 MG PO TABS
0.1000 mg | ORAL_TABLET | Freq: Three times a day (TID) | ORAL | Status: DC
  Administered 2017-08-30 – 2017-09-03 (×13): 0.1 mg via ORAL
  Filled 2017-08-30 (×14): qty 1

## 2017-08-30 MED ORDER — POTASSIUM CHLORIDE 20 MEQ/15ML (10%) PO SOLN
40.0000 meq | Freq: Once | ORAL | Status: AC
  Administered 2017-08-30: 40 meq via ORAL
  Filled 2017-08-30: qty 30

## 2017-08-30 MED ORDER — INSULIN ASPART 100 UNIT/ML ~~LOC~~ SOLN
0.0000 [IU] | Freq: Three times a day (TID) | SUBCUTANEOUS | Status: DC
  Administered 2017-08-30 – 2017-09-01 (×5): 2 [IU] via SUBCUTANEOUS
  Administered 2017-09-02: 1 [IU] via SUBCUTANEOUS
  Administered 2017-09-02 – 2017-09-03 (×2): 2 [IU] via SUBCUTANEOUS

## 2017-08-30 MED ORDER — SODIUM CHLORIDE 0.9 % IV SOLN: INTRAVENOUS | Status: DC

## 2017-08-30 MED ORDER — INSULIN ASPART 100 UNIT/ML ~~LOC~~ SOLN: 0.0000 [IU] | Freq: Every day | SUBCUTANEOUS | Status: DC

## 2017-08-30 MED ORDER — TRAZODONE HCL 50 MG PO TABS
100.0000 mg | ORAL_TABLET | Freq: Every evening | ORAL | Status: DC | PRN
  Administered 2017-09-02: 100 mg via ORAL
  Filled 2017-08-30: qty 2

## 2017-08-30 MED ORDER — SODIUM CHLORIDE 0.9 % IV SOLN
INTRAVENOUS | Status: DC
  Administered 2017-08-30: 10:00:00 via INTRAVENOUS

## 2017-08-30 MED ORDER — PIPERACILLIN-TAZOBACTAM 3.375 G IVPB
3.3750 g | Freq: Three times a day (TID) | INTRAVENOUS | Status: DC
  Administered 2017-08-30 – 2017-09-02 (×10): 3.375 g via INTRAVENOUS
  Filled 2017-08-30 (×9): qty 50

## 2017-08-30 NOTE — H&P (Addendum)
History and Physical    Brittney Wall TOI:[id number] DOB: 11-Sep-1945 DOA: 08/29/2017  PCP: [name], MD   Patient coming from: Home  Chief Complaint: Generalized weakness and dyspnea  HPI: Brittney Wall is a 72 y.o. 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  She is noted to be more short of breath and weak according to her sister and her daughter.  Patient cannot give any significant history.  There is some question of whether or not she has been hypoxemic at home, but this is uncertain.  She is also noted to have some mild diarrhea and had stopped walking today due to her weakness. She has been noted to have worsening weight loss over the last several months, but appears to have good oral intake per the daughter at the bedside.  No cough or sputum production has been noted.   ED Course: Vital signs are stable with tachycardia noted.  Laboratory data with leukocytosis of 13,500, sodium 134, potassium 3.1, glucose 189, and lactic acid trending up to 3 from 2.52.  Two-view chest x-ray with right upper and left lower lobe opacities noted with some mild cardiomegaly and emphysema.  CT chest with contrast reviewed from last month with some obstructive/aspiration findings to the left lower lobe along with mass to right upper lobe.  Additionally, there is noted liver and adrenal mass.  According to the daughter at bedside, she is due for liver biopsy for further evaluation soon.  Review of Systems: Cannot obtain due to patient condition.  Past Medical History:  Diagnosis Date  . Bipolar 1 disorder ([place])   . Dementia   . Hypertension   . Left adrenal mass ([place]) 09/06/2016  . Pneumothorax on right 09/07/2016  . Schizophrenia [facility])      Past Surgical History:  Procedure Laterality Date  . BOWEL RESECTION  12/23/2016   Procedure: SMALL BOWEL RESECTION;  Surgeon: Virl Cagey, MD;  Location: [facility];  Service: General;;  . Fatima Blank HERNIA REPAIR  01/04/2017   Procedure: Jerry Caras WITH MESH;  Surgeon: Aviva Signs, MD;  Location: [facility];  Service: General;;  . LAPAROTOMY N/A 12/23/2016   Procedure: EXPLORATORY LAPAROTOMY;  Surgeon: Virl Cagey, MD;  Location: [facility];  Service: General;  Laterality: N/A;  . ortho procedure     for broken legs or ankles daughter unsure  . RADIOLOGY WITH ANESTHESIA N/A 06/03/2017   Procedure: MRI WITH ANESTHESIA LIVER WITH AND WITHOUT CONTRAST;  Surgeon: Radiologist, Medication, MD;  Location: [place];  Service: Radiology;  Laterality: N/A;  . RADIOLOGY WITH ANESTHESIA N/A 07/24/2017   Procedure: CT WITH ANESTHESIA OF CHEST WITH CONTRAST,SOFT TISSUE OF NECK WITH CONTAST;  Surgeon: Radiologist, Medication, MD;  Location: [place];  Service: Radiology;  Laterality: N/A;     reports that she has quit smoking. Her smoking use included cigarettes. She smoked 0.50 packs per day. She has never used smokeless tobacco. She reports that she does not drink alcohol or use drugs.  No Known Allergies  History reviewed. No pertinent family history.  Prior to Admission medications   Medication Sig Start Date End Date Taking? Authorizing Provider  cloNIDine (CATAPRES) 0.1 MG tablet Take 0.1 mg by mouth 3 (three) times daily.      [provider]  haloperidol (HALDOL) 5 MG tablet  Take 5 mg by mouth at bedtime.  02/16/15   [provider]  hydrALAZINE (APRESOLINE) 10 MG tablet Take 2 tablets (20 mg total) by mouth at bedtime. Patient taking differently: Take 10 mg by mouth at bedtime.  09/07/16   Rexene Alberts, MD  HYDROcodone-acetaminophen (HYCET) 7.5-325 mg/15 ml solution Take 5 mLs by mouth every 6 (six) hours as needed for moderate pain or severe pain. Patient  not taking: Reported on 07/17/2017 01/06/17   Virl Cagey, MD  LORazepam (ATIVAN) 0.5 MG tablet Take 0.5 mg by mouth at bedtime as needed for sleep.  07/19/16   [provider]  magnesium hydroxide (MILK OF MAGNESIA) 400 MG/5ML suspension Take 15 mLs by mouth daily. Patient taking differently: Take 15 mLs by mouth daily as needed for moderate constipation.  01/06/17   Virl Cagey, MD  metFORMIN (GLUCOPHAGE) 500 MG tablet Take 500 mg by mouth daily. 06/19/17   [provider]  minoxidil (LONITEN) 2.5 MG tablet Take 2 tablets (5 mg total) by mouth 2 (two) times daily. 03/17/15   Donne Hazel, MD  mirtazapine (REMERON) 30 MG tablet Take 30 mg by mouth at bedtime.      [provider]  pravastatin (PRAVACHOL) 20 MG tablet Take 20 mg by mouth at bedtime.     [provider]  traZODone (DESYREL) 100 MG tablet Take 100 mg by mouth at bedtime as needed for sleep.     [provider]    Physical Exam: Vitals:   08/29/17 2320 08/29/17 2330 08/30/17 0000 08/30/17 0030  BP:  (!) 174/83 (!) 153/61 (!) 158/63  Pulse:  (!) 111 (!) 106 (!) 105  Resp:  (!) 28 (!) 28 (!) 30  Temp: 99.3 F (37.4 C)     TempSrc: Rectal     SpO2:  95% 96% 98%  Weight:      Height:        Constitutional: Mildly agitated and hyperkinetic; confused; cachectic Vitals:   08/29/17 2320 08/29/17 2330 08/30/17 0000 08/30/17 0030  BP:  (!) 174/83 (!) 153/61 (!) 158/63  Pulse:  (!) 111 (!) 106 (!) 105  Resp:  (!) 28 (!) 28 (!) 30  Temp: 99.3 F (37.4 C)     TempSrc: Rectal     SpO2:  95% 96% 98%  Weight:      Height:       Eyes: lids and conjunctivae normal ENMT: Mucous membranes are moist.  Neck: normal, supple Respiratory: clear to auscultation bilaterally. Normal respiratory effort. No accessory muscle use.  Cardiovascular: Regular rate and rhythm, no murmurs. No extremity edema.  Tachycardic Abdomen: no tenderness, no distention. Bowel sounds positive.    Musculoskeletal:  No joint deformity upper and lower extremities.   Skin: no rashes, lesions, ulcers.   Labs on Admission: I have personally reviewed following labs and imaging studies  CBC: Recent Labs  Lab 08/29/17 2220  WBC 13.5*  NEUTROABS 12.6*  HGB 14.0  HCT 41.3  MCV 89.0  PLT 759   Basic Metabolic Panel: Recent Labs  Lab 08/29/17 2220  NA 134*  K 3.1*  CL 99  CO2 25  GLUCOSE 189*  BUN 38*  CREATININE 0.85  CALCIUM 9.2   GFR: Estimated Creatinine Clearance: 42.2 mL/min (by C-G formula based on SCr of 0.85 mg/dL). Liver Function Tests: Recent Labs  Lab 08/29/17 2220  AST 53*  ALT 23  ALKPHOS 80  BILITOT 1.2  PROT 8.2*  ALBUMIN 3.8  No results for input(s): LIPASE, AMYLASE in the last 168 hours. No results for input(s): AMMONIA in the last 168 hours. Coagulation Profile: No results for input(s): INR, PROTIME in the last 168 hours. Cardiac Enzymes: No results for input(s): CKTOTAL, CKMB, CKMBINDEX, TROPONINI in the last 168 hours. BNP (last 3 results) No results for input(s): PROBNP in the last 8760 hours. HbA1C: No results for input(s): HGBA1C in the last 72 hours. CBG: No results for input(s): GLUCAP in the last 168 hours. Lipid Profile: No results for input(s): CHOL, HDL, LDLCALC, TRIG, CHOLHDL, LDLDIRECT in the last 72 hours. Thyroid Function Tests: No results for input(s): TSH, T4TOTAL, FREET4, T3FREE, THYROIDAB in the last 72 hours. Anemia Panel: No results for input(s): VITAMINB12, FOLATE, FERRITIN, TIBC, IRON, RETICCTPCT in the last 72 hours. Urine analysis:    Component Value Date/Time   COLORURINE YELLOW 04/17/2017 0256   APPEARANCEUR CLEAR 04/17/2017 0256   LABSPEC 1.019 04/17/2017 0256   PHURINE 5.0 04/17/2017 0256   GLUCOSEU NEGATIVE 04/17/2017 0256   HGBUR NEGATIVE 04/17/2017 0256   BILIRUBINUR NEGATIVE 04/17/2017 0256   KETONESUR NEGATIVE 04/17/2017 0256   PROTEINUR 30 (A) 04/17/2017 0256   UROBILINOGEN 0.2 03/12/2013 1350    NITRITE NEGATIVE 04/17/2017 0256   LEUKOCYTESUR NEGATIVE 04/17/2017 0256    Radiological Exams on Admission: Dg Chest 2 View  Result Date: 08/29/2017 CLINICAL DATA:  Initial evaluation for acute shortness of breath. Cough. EXAM: CHEST - 2 VIEW COMPARISON:  Prior CT from 07/25/2014. FINDINGS: Cardiomegaly, stable from previous. Mediastinal silhouette within normal limits. Aortic atherosclerosis. Lungs are hyperinflated with underlying emphysematous changes. Patchy opacities at the inferior right upper and left lower lobes, which could reflect atelectasis or infiltrates. Small left pleural effusion. No pulmonary edema. No pneumothorax. No acute osseus abnormality. IMPRESSION: 1. Patchy multifocal opacities involving the right upper and left lower lobes, which could reflect atelectasis or infiltrates. 2. Small left pleural effusion. 3. Underlying emphysema. 4. Cardiomegaly with aortic atherosclerosis. Electronically Signed   By: Jeannine Boga M.D.   On: 08/29/2017 21:34    EKG: Independently reviewed. Sinus tachycardia 109bpm with nonspecific ST/T wave changes.  Assessment/Plan Principal Problem:   Pneumonia Active Problems:   Schizophrenia ([place])   Dementia   Protein-calorie malnutrition, severe (HCC)   Left adrenal mass (HCC)   Hypokalemia   Liver metastases (HCC)   Pulmonary metastases (HCC)   Lactic acidosis   COPD (chronic obstructive pulmonary disease) (HCC)   Diabetes ([place])   Essential hypertension   Hyperlipidemia    1. Generalized weakness with dyspnea concerning for progression of metastatic disease with unknown primary with possible overlying postobstructive pneumonia.  Maintain on IV Zosyn due to concern for aspiration and recheck lactic acid levels and obtain procalcitonin.  Patient has received Rocephin and azithromycin in the ED with IV fluid bolus.  We will also maintain on Mucomyst as well as DuoNeb's and Pulmicort.  Will consult oncology for evaluation and will  also need palliative care consultation.  Consider CT chest possibly with contrast to reevaluate pending oncology recommendations.  Patient recently had CT evaluation just last month. PT evaluation for weakness. 2. Lactic acidosis.  Continue to trend and maintain on IV fluid.  Patient does not appear to be septic. 3. Mild hypokalemia.  Replete orally and check magnesium levels. 4. COPD with prior tobacco abuse.  Duo nebs as needed with Pulmicort.  Patient does not wear oxygen at home, but is currently on nasal cannula. 5. Essential hypertension.  Continue home hydralazine and clonidine  as well as minoxidil. 6. Dyslipidemia.  Continue pravastatin. 7. Type 2 diabetes.  Hold home metformin and place on sliding scale insulin. 8. Bipolar/schizophrenia.  Maintain on Remeron as well as Haldol and Ativan for behavioral disturbances.   DVT prophylaxis: Lovenox Code Status: DNR Family Communication: Daughter at bedside who is POA; Sister of patient on phone Disposition Plan:Pneumonia treatment and Oncology evaluation/Palliative Consults called:Oncology Admission status: Inpatient, tele   Rodena Goldmann DO [facility] Pager [phone]  If 7PM-7AM, please contact night-coverage [email or web address] Password [facility]  08/30/2017, 12:56 AM

## 2017-08-30 NOTE — ED Provider Notes (Signed)
[facility] EMERGENCY DEPARTMENT Provider Note   CSN: [id number] Arrival date & time: 08/29/17  2033     History   Chief Complaint Chief Complaint  Patient presents with  . Shortness of Breath    HPI Brittney MERRIHEW is a 72 y.o. female.  HPI  72 year old female comes in with chief complaint of weakness and shortness of breath.  Level 5 caveat for dementia.  Patient has past medical history of hypertension, bipolar disorder and she is actively being worked up for malignancy -as she was noted to have liver mass earlier in the year.  According to patient's family, patient has been feeling weak since yesterday, and she stopped walking today which got him concerned.  Additionally they noted that patient's heart rate was fast and that she was short of breath and had low oxygen saturation.    Past Medical History:  Diagnosis Date  . Bipolar 1 disorder ([place])   . Dementia   . Hypertension   . Left adrenal mass ([place]) 09/06/2016  . Pneumothorax on right 09/07/2016  . Schizophrenia [facility])     Patient Active Problem List   Diagnosis Date Noted  . Fascial defect   . Wound dehiscence 01/03/2017  . Small bowel obstruction ([place]) 12/23/2016  . Hypokalemia 12/23/2016  . Obturator hernia   . Ischemic bowel disease ([place])   . Pneumothorax on right 09/07/2016  . Left adrenal mass ([place]) 09/06/2016  . Abnormal CT scan 09/06/2016  . Compression fracture of L5 lumbar vertebra 09/06/2016  . Malignant hypertension 09/06/2016  . Ileus ([place]) 09/05/2016  . Chest pain 03/15/2015  . Hypertensive urgency 03/15/2015  . Schizophrenia ([place]) 03/15/2015  . Tobacco abuse 03/15/2015  . Dementia with behavioral disturbance 03/15/2015  . Protein-calorie malnutrition, severe ([place]) 03/15/2015  . Chest pain at rest 03/15/2015    Past Surgical History:  Procedure Laterality Date  . BOWEL RESECTION  12/23/2016   Procedure: SMALL BOWEL RESECTION;  Surgeon: Virl Cagey, MD;  Location: [facility];   Service: General;;  . Fatima Blank HERNIA REPAIR  01/04/2017   Procedure: Jerry Caras WITH MESH;  Surgeon: Aviva Signs, MD;  Location: [facility];  Service: General;;  . LAPAROTOMY N/A 12/23/2016   Procedure: EXPLORATORY LAPAROTOMY;  Surgeon: Virl Cagey, MD;  Location: [facility];  Service: General;  Laterality: N/A;  . ortho procedure     for broken legs or ankles daughter unsure  . RADIOLOGY WITH ANESTHESIA N/A 06/03/2017   Procedure: MRI WITH ANESTHESIA LIVER WITH AND WITHOUT CONTRAST;  Surgeon: Radiologist, Medication, MD;  Location: [place];  Service: Radiology;  Laterality: N/A;  . RADIOLOGY WITH ANESTHESIA N/A 07/24/2017   Procedure: CT WITH ANESTHESIA OF CHEST WITH CONTRAST,SOFT TISSUE OF NECK WITH CONTAST;  Surgeon: Radiologist, Medication, MD;  Location: [place];  Service: Radiology;  Laterality: N/A;     OB History   None      Home Medications    Prior to Admission medications   Medication Sig Start Date End Date Taking? Authorizing Provider  cloNIDine (CATAPRES) 0.1 MG tablet Take 0.1 mg by mouth 3 (three) times daily.      [provider]  haloperidol (HALDOL) 5 MG tablet Take 5 mg by mouth at bedtime.  02/16/15   [provider]  hydrALAZINE (APRESOLINE) 10 MG tablet Take 2 tablets (20 mg total) by mouth at bedtime. Patient taking differently: Take 10 mg by mouth at bedtime.  09/07/16   Rexene Alberts, MD  HYDROcodone-acetaminophen (HYCET) 7.5-325 mg/15 ml  solution Take 5 mLs by mouth every 6 (six) hours as needed for moderate pain or severe pain. Patient not taking: Reported on 07/17/2017 01/06/17   Virl Cagey, MD  LORazepam (ATIVAN) 0.5 MG tablet Take 0.5 mg by mouth at bedtime as needed for sleep.  07/19/16   [provider]  magnesium hydroxide (MILK OF MAGNESIA) 400 MG/5ML suspension Take 15 mLs by mouth daily. Patient taking differently: Take 15 mLs by mouth daily as needed for moderate constipation.  01/06/17   Virl Cagey,  MD  metFORMIN (GLUCOPHAGE) 500 MG tablet Take 500 mg by mouth daily. 06/19/17   [provider]  minoxidil (LONITEN) 2.5 MG tablet Take 2 tablets (5 mg total) by mouth 2 (two) times daily. 03/17/15   Donne Hazel, MD  mirtazapine (REMERON) 30 MG tablet Take 30 mg by mouth at bedtime.      [provider]  pravastatin (PRAVACHOL) 20 MG tablet Take 20 mg by mouth at bedtime.     [provider]  traZODone (DESYREL) 100 MG tablet Take 100 mg by mouth at bedtime as needed for sleep.     [provider]    Family History History reviewed. No pertinent family history.  Social History Social History   Tobacco Use  . Smoking status: Former Smoker    Packs/day: 0.50    Types: Cigarettes  . Smokeless tobacco: Never Used  Substance Use Topics  . Alcohol use: No  . Drug use: No     Allergies   Patient has no known allergies.   Review of Systems Review of Systems  Unable to perform ROS: Dementia     Physical Exam Updated Vital Signs BP (!) 153/61   Pulse (!) 106   Temp 99.3 F (37.4 C) (Rectal)   Resp (!) 28   Ht 5\' 3"  (1.6 m)   Wt 44 kg (97 lb)   SpO2 96%   BMI 17.18 kg/m   Physical Exam  Constitutional: She is oriented to person, place, and time. No distress.  HENT:  Head: Normocephalic and atraumatic.  Eyes: EOM are normal.  Neck: Normal range of motion. Neck supple.  Cardiovascular:  Tachycardia  Pulmonary/Chest: Effort normal. No accessory muscle usage or stridor. Tachypnea noted. No respiratory distress. She has no decreased breath sounds. She has no wheezes.  Lung exam is limited due to poor effort  Abdominal: Bowel sounds are normal.  Musculoskeletal:       Right lower leg: She exhibits edema.       Left lower leg: She exhibits edema.  Neurological: She is alert and oriented to person, place, and time.  Skin: Skin is warm and dry.  Nursing note and vitals reviewed.    ED Treatments / Results  Labs (all labs ordered  are listed, but only abnormal results are displayed) Labs Reviewed  COMPREHENSIVE METABOLIC PANEL - Abnormal; Notable for the following components:      Result Value   Sodium 134 (*)    Potassium 3.1 (*)    Glucose, Bld 189 (*)    BUN 38 (*)    Total Protein 8.2 (*)    AST 53 (*)    All other components within normal limits  CBC WITH DIFFERENTIAL/PLATELET - Abnormal; Notable for the following components:   WBC 13.5 (*)    Neutro Abs 12.6 (*)    Lymphs Abs 0.5 (*)    All other components within normal limits  I-STAT CG4 LACTIC ACID, ED -  Abnormal; Notable for the following components:   Lactic Acid, Venous 2.52 (*)    All other components within normal limits  CULTURE, BLOOD (ROUTINE X 2)  CULTURE, BLOOD (ROUTINE X 2)  URINALYSIS, ROUTINE W REFLEX MICROSCOPIC  I-STAT CG4 LACTIC ACID, ED    EKG EKG Interpretation  Date/Time:  Friday August 29 2017 21:43:41 EDT Ventricular Rate:  109 PR Interval:    QRS Duration: 103 QT Interval:  317 QTC Calculation: 427 R Axis:   56 Text Interpretation:  Sinus tachycardia Ventricular premature complex Probable left atrial enlargement LVH with secondary repolarization abnormality No acute changes Nonspecific ST and T wave abnormality Confirmed by Varney Biles [phone]) on 08/30/2017 12:11:40 AM   Radiology Dg Chest 2 View  Result Date: 08/29/2017 CLINICAL DATA:  Initial evaluation for acute shortness of breath. Cough. EXAM: CHEST - 2 VIEW COMPARISON:  Prior CT from 07/25/2014. FINDINGS: Cardiomegaly, stable from previous. Mediastinal silhouette within normal limits. Aortic atherosclerosis. Lungs are hyperinflated with underlying emphysematous changes. Patchy opacities at the inferior right upper and left lower lobes, which could reflect atelectasis or infiltrates. Small left pleural effusion. No pulmonary edema. No pneumothorax. No acute osseus abnormality. IMPRESSION: 1. Patchy multifocal opacities involving the right upper and left lower lobes,  which could reflect atelectasis or infiltrates. 2. Small left pleural effusion. 3. Underlying emphysema. 4. Cardiomegaly with aortic atherosclerosis. Electronically Signed   By: Jeannine Boga M.D.   On: 08/29/2017 21:34    Procedures Procedures (including critical care time)  CRITICAL CARE Performed by: Beronica Lansdale   Total critical care time: 42 minutes  Critical care time was exclusive of separately billable procedures and treating other patients.  Critical care was necessary to treat or prevent imminent or life-threatening deterioration.  Critical care was time spent personally by me on the following activities: development of treatment plan with patient and/or surrogate as well as nursing, discussions with consultants, evaluation of patient's response to treatment, examination of patient, obtaining history from patient or surrogate, ordering and performing treatments and interventions, ordering and review of laboratory studies, ordering and review of radiographic studies, pulse oximetry and re-evaluation of patient's condition.   Medications Ordered in ED Medications  cefTRIAXone (ROCEPHIN) 2 g in sodium chloride 0.9 % 100 mL IVPB (0 g Intravenous Stopped 08/29/17 2330)  azithromycin (ZITHROMAX) 500 mg in sodium chloride 0.9 % 250 mL IVPB (500 mg Intravenous New Bag/Given 08/29/17 2335)  albuterol (PROVENTIL) (2.5 MG/3ML) 0.083% nebulizer solution 5 mg (5 mg Nebulization Given 08/29/17 2116)  sodium chloride 0.9 % bolus 1,000 mL (1,000 mLs Intravenous New Bag/Given 08/29/17 2336)    And  sodium chloride 0.9 % bolus 500 mL (0 mLs Intravenous Stopped 08/29/17 2331)     Initial Impression / Assessment and Plan / ED Course  I have reviewed the triage vital signs and the nursing notes.  Pertinent labs & imaging results that were available during my care of the patient were reviewed by me and considered in my medical decision making (see chart for details).     72 year old  female comes in with chief complaint of shortness of breath and weakness.  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  Patient is demented and unable to provide any meaningful history.  She is noted to be tachycardic and tachypneic, without any respiratory distress.  She is not hypoxic during our evaluation -but at home she was hypoxic.  Clinical concerns are for pneumonia, PE, pleural effusion, pulmonary edema, lung tumor.  Chest x-ray findings consistent with possible opacity that is infectious in nature.  Of course, metastatic lesion also possible however, CT chest with contrast was ordered last month, which had not shown any obvious PEs or lung mass.  Plan for now is to start treating patient for community-acquired pneumonia.  Patient can be admitted, get consultation from oncology and then appropriate advanced imaging can be coordinated if needed.  If patient is not getting better with antibiotics, then CT PE can also be considered at that time.  Final Clinical Impressions(s) / ED Diagnoses   Final diagnoses:  Community acquired pneumonia, unspecified laterality  Severe sepsis [facility] [place])    ED Discharge Orders    None       Varney Biles, MD 08/30/17 [phone]

## 2017-08-30 NOTE — Progress Notes (Addendum)
Pharmacy Antibiotic Note  Brittney Wall is a 72 y.o. female admitted on 08/29/2017 with aspiration pneumonia.  Pharmacy has been consulted for zosyn dosing.  Plan: Zosyn 3.375g IV q8h (4 hour infusion).  Height: 5\' 3"  (160 cm) Weight: 97 lb (44 kg) IBW/kg (Calculated) : 52.4  Temp (24hrs), Avg:98.7 F (37.1 C), Min:98 F (36.7 C), Max:99.3 F (37.4 C)  Recent Labs  Lab 08/29/17 2220 08/29/17 2325 08/30/17 0029  WBC 13.5*  --   --   CREATININE 0.85  --   --   LATICACIDVEN  --  2.52* 3.01*    Estimated Creatinine Clearance: 42.2 mL/min (by C-G formula based on SCr of 0.85 mg/dL).    No Known Allergies  Anti-infectives (From admission, onward)   Start     Dose/Rate Route Frequency Ordered Stop   08/30/17 0800  piperacillin-tazobactam (ZOSYN) IVPB 3.375 g     3.375 g 12.5 mL/hr over 240 Minutes Intravenous Every 8 hours 08/30/17 0752     08/30/17 0130  piperacillin-tazobactam (ZOSYN) IVPB 3.375 g     3.375 g 12.5 mL/hr over 240 Minutes Intravenous  Once 08/30/17 0122 08/30/17 0704   08/29/17 2230  cefTRIAXone (ROCEPHIN) 2 g in sodium chloride 0.9 % 100 mL IVPB  Status:  Discontinued     2 g 200 mL/hr over 30 Minutes Intravenous Every 24 hours 08/29/17 2216 08/30/17 0115   08/29/17 2230  azithromycin (ZITHROMAX) 500 mg in sodium chloride 0.9 % 250 mL IVPB  Status:  Discontinued     500 mg 250 mL/hr over 60 Minutes Intravenous Every 24 hours 08/29/17 2216 08/30/17 0115     Assessment: 72 yo F presenting with shortness of breath and leukocytosis, CT chest from last month shows aspiration in LLL and mass to RUL. Starting Zosyn 3.375 mg q8h (CrCl 42.2 ml/min) for aspiration pneumonia.     Antimicrobials this admission:  7/19 azithromycin >> 7/20 7/19 ceftriaxone >> 7/20 7/20 zosyn >>   Microbiology results:  7/19 BCx: ngtd   Thank you for allowing pharmacy to be a part of this patient's care.  Donna Christen Jeraldine Primeau 08/30/2017 7:53 AM

## 2017-08-30 NOTE — ED Notes (Signed)
CRITICAL VALUE ALERT  Critical Value:  Lactic 3.01  Date & Time Notied:  08/30/17 1240  Provider Notified: Dr Manuella Ghazi  Orders Received/Actions taken:

## 2017-08-30 NOTE — ED Notes (Signed)
Dr Manuella Ghazi in with pt. Advised him of lactic acid 3.01.

## 2017-08-30 NOTE — Progress Notes (Signed)
ANTIBIOTIC CONSULT NOTE - INITIAL  Pharmacy Consult for Zosyn Indication: pneumonia  No Known Allergies  Patient Measurements: Height: 5\' 3"  (160 cm) Weight: 97 lb (44 kg) IBW/kg (Calculated) : 52.4  Vital Signs: Temp: 99.3 F (37.4 C) (07/19 2320) Temp Source: Rectal (07/19 2320) BP: 158/63 (07/20 0030) Pulse Rate: 105 (07/20 0030) Intake/Output from previous day: 07/19 0701 - 07/20 0700 In: 1800 [IV Piggyback:1800] Out: -  Intake/Output from this shift: Total I/O In: 1800 [IV Piggyback:1800] Out: -   Labs: Recent Labs    08/29/17 2220  WBC 13.5*  HGB 14.0  PLT 218  CREATININE 0.85   Estimated Creatinine Clearance: 42.2 mL/min (by C-G formula based on SCr of 0.85 mg/dL). No results for input(s): VANCOTROUGH, VANCOPEAK, VANCORANDOM, GENTTROUGH, GENTPEAK, GENTRANDOM, TOBRATROUGH, TOBRAPEAK, TOBRARND, AMIKACINPEAK, AMIKACINTROU, AMIKACIN in the last 72 hours.   Microbiology: Recent Results (from the past 720 hour(s))  Blood Culture (routine x 2)     Status: None (Preliminary result)   Collection Time: 08/29/17 10:21 PM  Result Value Ref Range Status   Specimen Description LEFT ANTECUBITAL  Final   Special Requests   Final    BOTTLES DRAWN AEROBIC ONLY Blood Culture adequate volume Performed at [facility], [address], [place], [place] [address]    Culture PENDING  Incomplete   Report Status PENDING  Incomplete  Blood Culture (routine x 2)     Status: None (Preliminary result)   Collection Time: 08/29/17 11:00 PM  Result Value Ref Range Status   Specimen Description BLOOD LEFT ARM  Final   Special Requests   Final    BOTTLES DRAWN AEROBIC ONLY Blood Culture adequate volume Performed at [facility], [address], [place], [place] [address]    Culture PENDING  Incomplete   Report Status PENDING  Incomplete    Medical History: Past Medical History:  Diagnosis Date  . Bipolar 1 disorder ([place])   . Dementia   . Hypertension   . Left adrenal mass ([place])  09/06/2016  . Pneumothorax on right 09/07/2016  . Schizophrenia ([place])     Medications:  Scheduled:  . acetylcysteine  2 mL Nebulization BID  . budesonide (PULMICORT) nebulizer solution  0.25 mg Nebulization BID  . cloNIDine  0.1 mg Oral TID  . enoxaparin (LOVENOX) injection  40 mg Subcutaneous Q24H  . haloperidol  5 mg Oral QHS  . hydrALAZINE  20 mg Oral QHS  . insulin aspart  0-5 Units Subcutaneous QHS  . insulin aspart  0-9 Units Subcutaneous Q4H  . minoxidil  5 mg Oral BID  . mirtazapine  30 mg Oral QHS  . potassium chloride  40 mEq Oral Once  . pravastatin  20 mg Oral QHS   Infusions:  . sodium chloride    . piperacillin-tazobactam (ZOSYN)  IV     Anti-infectives (From admission, onward)   Start     Dose/Rate Route Frequency Ordered Stop   08/30/17 0130  piperacillin-tazobactam (ZOSYN) IVPB 3.375 g     3.375 g 12.5 mL/hr over 240 Minutes Intravenous  Once 08/30/17 0122     08/29/17 2230  cefTRIAXone (ROCEPHIN) 2 g in sodium chloride 0.9 % 100 mL IVPB  Status:  Discontinued     2 g 200 mL/hr over 30 Minutes Intravenous Every 24 hours 08/29/17 2216 08/30/17 0115   08/29/17 2230  azithromycin (ZITHROMAX) 500 mg in sodium chloride 0.9 % 250 mL IVPB  Status:  Discontinued     500 mg 250 mL/hr over 60  Minutes Intravenous Every 24 hours 08/29/17 2216 08/30/17 0115     Assessment: 72 yo F presenting with shortness of breath and leukocytosis, CT chest from last month shows aspiration in LLL and mass to RUL. Starting Zosyn 3.375 mg q8h (CrCl 42.2 ml/min) for aspiration pneumonia.  Plan:  One dose of Zosyn ordered. Forestine Na clinical pharmacist will f/u in the morning.   Benigno Check, [place] 08/30/2017,1:25 AM

## 2017-08-30 NOTE — Progress Notes (Addendum)
08/30/17  08/29/2017  8:37 PM  08/30/2017 10:30 AM  Brittney Wall was seen and examined.  The H&P by the admitting provider, orders, imaging was reviewed.  Please see new orders.  Will continue to follow.   Murvin Natal, MD [facility]   Patient was admitted today by Dr. Manuella Ghazi for generalized weakness and dyspnea. I have seen the patient and reviewed the assessment plan. I will continue the assessment plan as ordered by Dr. Manuella Ghazi. Will continue IV Zosyn and mild fluid resuscitation of NS at 60 mL/hr. Patient was complaining of moderate abdominal pain. Have ordered morphine 2 mg IV every 3 hours as needed for moderate to severe pain. Will continue duonebs and pulmicort. Oncology and PT consult is pending. Her lactic acid is improving down from 3.01 to 1.3 this morning.  Her daughter was at bedside and was updated.   Gaston Islam, Student AGACNP  Attending:  Irwin Brakeman, MD

## 2017-08-31 DIAGNOSIS — E279 Disorder of adrenal gland, unspecified: Secondary | ICD-10-CM

## 2017-08-31 DIAGNOSIS — F0391 Unspecified dementia with behavioral disturbance: Secondary | ICD-10-CM

## 2017-08-31 DIAGNOSIS — E785 Hyperlipidemia, unspecified: Secondary | ICD-10-CM

## 2017-08-31 DIAGNOSIS — E118 Type 2 diabetes mellitus with unspecified complications: Secondary | ICD-10-CM

## 2017-08-31 LAB — GLUCOSE, CAPILLARY
GLUCOSE-CAPILLARY: 165 mg/dL — AB (ref 70–99)
Glucose-Capillary: 112 mg/dL — ABNORMAL HIGH (ref 70–99)
Glucose-Capillary: 167 mg/dL — ABNORMAL HIGH (ref 70–99)

## 2017-08-31 LAB — BASIC METABOLIC PANEL
ANION GAP: 5 (ref 5–15)
BUN: 24 mg/dL — ABNORMAL HIGH (ref 8–23)
CO2: 26 mmol/L (ref 22–32)
Calcium: 8.4 mg/dL — ABNORMAL LOW (ref 8.9–10.3)
Chloride: 110 mmol/L (ref 98–111)
Creatinine, Ser: 0.65 mg/dL (ref 0.44–1.00)
Glucose, Bld: 129 mg/dL — ABNORMAL HIGH (ref 70–99)
Potassium: 3.6 mmol/L (ref 3.5–5.1)
SODIUM: 141 mmol/L (ref 135–145)

## 2017-08-31 LAB — CBC
HEMATOCRIT: 31.4 % — AB (ref 36.0–46.0)
Hemoglobin: 10.2 g/dL — ABNORMAL LOW (ref 12.0–15.0)
MCH: 28.9 pg (ref 26.0–34.0)
MCHC: 32.5 g/dL (ref 30.0–36.0)
MCV: 89 fL (ref 78.0–100.0)
Platelets: 206 10*3/uL (ref 150–400)
RBC: 3.53 MIL/uL — ABNORMAL LOW (ref 3.87–5.11)
RDW: 14.6 % (ref 11.5–15.5)
WBC: 5.6 10*3/uL (ref 4.0–10.5)

## 2017-08-31 MED ORDER — IPRATROPIUM-ALBUTEROL 0.5-2.5 (3) MG/3ML IN SOLN
3.0000 mL | Freq: Two times a day (BID) | RESPIRATORY_TRACT | Status: DC
  Administered 2017-08-31 – 2017-09-03 (×6): 3 mL via RESPIRATORY_TRACT
  Filled 2017-08-31 (×6): qty 3

## 2017-08-31 NOTE — Progress Notes (Signed)
PROGRESS NOTE   Brittney Wall  YPP:[id number]  DOB: 1945/05/25  DOA: 08/29/2017 PCP: [name], MD   Brief Admission Hx: Brittney Wall is a 72 y.o. 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  She is noted to be more short of breath and weak according to her sister and her daughter.  She was admitted with suspected aspiration pneumonia.   MDM/Assessment & Plan:   1. Generalized weakness with dyspnea concerning for progression of metastatic disease with unknown primary with possible overlying postobstructive pneumonia.  Maintain on IV Zosyn due to concern for aspiration and recheck lactic acid levels and obtain procalcitonin.  Patient has received Rocephin and azithromycin in the ED with IV fluid bolus.  We will also maintain on Mucomyst as well as DuoNeb's and Pulmicort.  Will consult oncology for evaluation and will also need palliative care consultation.  Consider CT chest possibly with contrast to reevaluate pending oncology recommendations.  Patient recently had CT evaluation just last month. PT evaluation for weakness. 2. Presumed aspiration pneumonia - Pt seems to be improving with IV zosyn which we will continue.  Pt is having a more productive cough today.  3. Lactic acidosis.  Resolved now.  Patient does not appear to be septic. 4. Mild hypokalemia.  Repleted orally.   5. COPD with prior tobacco abuse.  Duo nebs as needed with Pulmicort.  Patient does not wear oxygen at home, but is currently on nasal cannula. 6. Essential hypertension.  Continue home hydralazine and clonidine as well as minoxidil. 7. Dyslipidemia.  Continue pravastatin. 8. Type 2 diabetes.  Hold home metformin and place on sliding scale insulin.   Blood sugars controlled at this time.  9. Bipolar/schizophrenia.  Maintain on Remeron as well as Haldol and Ativan for behavioral disturbances.  Family remains at bedside 24/7.    DVT prophylaxis: Lovenox Code Status: DNR Family Communication: Daughter at bedside updated Disposition Plan:Pneumonia treatment and Oncology evaluation/Palliative Consults called:Oncology, palliative medicine  Consultants:  Oncology  Palliative medicine  Subjective: Pt rested well overnight. She is having a productive cough with green/yellow sputum   Objective: Vitals:   08/30/17 2011 08/30/17 2051 08/31/17 0639 08/31/17 0956  BP:  (!) 143/67 (!) 149/70   Pulse:  94 88   Resp:  18 16   Temp:  98.6 F (37 C) 99.2 F (37.3 C)   TempSrc:  Oral Oral   SpO2: 96% 99% 97% 96%  Weight:      Height:        Intake/Output Summary (Last 24 hours) at 08/31/2017 1158 Last data filed at 08/31/2017 4580 Gross per 24 hour  Intake 1426 ml  Output 400 ml  Net 1026 ml   Filed Weights   08/29/17 2039  Weight: 44 kg (97 lb)   REVIEW OF SYSTEMS  As per history otherwise all reviewed and reported negative  Exam:  General exam: elderly confused female lying in bed, NAD, disoriented.  Respiratory system: rales heard RLL.  No increased work of breathing. Cardiovascular system: normal S1 & S2 heard.  No JVD, murmurs, gallops, clicks or pedal edema. Gastrointestinal system: Abdomen is nondistended, soft and nontender. Normal bowel sounds heard. Central nervous system: Alert and confused, disoriented. No focal neurological deficits. Extremities: no cyanosis or pretibial edema.  Data Reviewed: Basic Metabolic Panel: Recent  Labs  Lab 08/29/17 2220 08/30/17 0659 08/31/17 0600  NA 134*  --  141  K 3.1*  --  3.6  CL 99  --  110  CO2 25  --  26  GLUCOSE 189*  --  129*  BUN 38*  --  24*  CREATININE 0.85  --  0.65  CALCIUM 9.2  --  8.4*  MG  --  2.0  --    Liver Function Tests: Recent Labs  Lab  08/29/17 2220  AST 53*  ALT 23  ALKPHOS 80  BILITOT 1.2  PROT 8.2*  ALBUMIN 3.8   No results for input(s): LIPASE, AMYLASE in the last 168 hours. No results for input(s): AMMONIA in the last 168 hours. CBC: Recent Labs  Lab 08/29/17 2220 08/31/17 0600  WBC 13.5* 5.6  NEUTROABS 12.6*  --   HGB 14.0 10.2*  HCT 41.3 31.4*  MCV 89.0 89.0  PLT 218 206   Cardiac Enzymes: No results for input(s): CKTOTAL, CKMB, CKMBINDEX, TROPONINI in the last 168 hours. CBG (last 3)  Recent Labs    08/30/17 2047 08/31/17 0742 08/31/17 1120  GLUCAP 134* 112* 167*   Recent Results (from the past 240 hour(s))  Blood Culture (routine x 2)     Status: None (Preliminary result)   Collection Time: 08/29/17 10:21 PM  Result Value Ref Range Status   Specimen Description LEFT ANTECUBITAL  Final   Special Requests   Final    BOTTLES DRAWN AEROBIC ONLY Blood Culture adequate volume   Culture   Final    NO GROWTH 2 DAYS Performed at [facility], [address] [place], [place] [address]    Report Status PENDING  Incomplete  Blood Culture (routine x 2)     Status: None (Preliminary result)   Collection Time: 08/29/17 11:00 PM  Result Value Ref Range Status   Specimen Description BLOOD LEFT ARM  Final   Special Requests   Final    BOTTLES DRAWN AEROBIC ONLY Blood Culture adequate volume   Culture   Final    NO GROWTH 2 DAYS Performed at [facility], [address], [place], [place] [address]    Report Status PENDING  Incomplete    Studies: Dg Chest 2 View  Result Date: 08/29/2017 CLINICAL DATA:  Initial evaluation for acute shortness of breath. Cough. EXAM: CHEST - 2 VIEW COMPARISON:  Prior CT from 07/25/2014. FINDINGS: Cardiomegaly, stable from previous. Mediastinal silhouette within normal limits. Aortic atherosclerosis. Lungs are hyperinflated with underlying emphysematous changes. Patchy opacities at the inferior right upper and left lower lobes, which could reflect atelectasis or  infiltrates. Small left pleural effusion. No pulmonary edema. No pneumothorax. No acute osseus abnormality. IMPRESSION: 1. Patchy multifocal opacities involving the right upper and left lower lobes, which could reflect atelectasis or infiltrates. 2. Small left pleural effusion. 3. Underlying emphysema. 4. Cardiomegaly with aortic atherosclerosis. Electronically Signed   By: Jeannine Boga M.D.   On: 08/29/2017 21:34   Scheduled Meds: . acetylcysteine  2 mL Nebulization BID  . budesonide (PULMICORT) nebulizer solution  0.25 mg Nebulization BID  . cloNIDine  0.1 mg Oral TID  . enoxaparin (LOVENOX) injection  30 mg Subcutaneous Q24H  . haloperidol  5 mg Oral QHS  . hydrALAZINE  10 mg Oral Q8H  . insulin aspart  0-9 Units Subcutaneous TID WC  . minoxidil  5 mg Oral BID  . mirtazapine  30 mg Oral QHS  . pravastatin  20 mg Oral QHS  Continuous Infusions: . piperacillin-tazobactam (ZOSYN)  IV Stopped (08/31/17 1100)    Principal Problem:   Pneumonia Active Problems:   Schizophrenia ([place])   Dementia   Protein-calorie malnutrition, severe ([facility])   Left adrenal mass ([facility])   Hypokalemia   Liver metastases ([facility])   Pulmonary metastases ([facility])   Lactic acidosis   COPD (chronic obstructive pulmonary disease) ([facility])   Diabetes ([place])   Essential hypertension   Hyperlipidemia  Time spent:   Irwin Brakeman, MD, FAAFP [facility] Pager [phone]  If 7PM-7AM, please contact night-coverage [email or web address] Password [email or web address] 08/31/2017, 11:58 AM    LOS: 1 day

## 2017-08-31 NOTE — Progress Notes (Signed)
Patient has been asleep since approximately 2100.  Family requested that patient is asleep and not to be bothered/manipulated at this time.  Patient family states she can be very agitated and wanted patient to rest as long as possible.

## 2017-09-01 DIAGNOSIS — T17908S Unspecified foreign body in respiratory tract, part unspecified causing other injury, sequela: Secondary | ICD-10-CM

## 2017-09-01 DIAGNOSIS — Z87891 Personal history of nicotine dependence: Secondary | ICD-10-CM

## 2017-09-01 DIAGNOSIS — F0151 Vascular dementia with behavioral disturbance: Secondary | ICD-10-CM

## 2017-09-01 DIAGNOSIS — C801 Malignant (primary) neoplasm, unspecified: Secondary | ICD-10-CM

## 2017-09-01 DIAGNOSIS — J9811 Atelectasis: Secondary | ICD-10-CM

## 2017-09-01 LAB — CBC
HCT: 35.1 % — ABNORMAL LOW (ref 36.0–46.0)
HEMOGLOBIN: 11.3 g/dL — AB (ref 12.0–15.0)
MCH: 28.7 pg (ref 26.0–34.0)
MCHC: 32.2 g/dL (ref 30.0–36.0)
MCV: 89.1 fL (ref 78.0–100.0)
Platelets: 235 10*3/uL (ref 150–400)
RBC: 3.94 MIL/uL (ref 3.87–5.11)
RDW: 14.3 % (ref 11.5–15.5)
WBC: 6.8 10*3/uL (ref 4.0–10.5)

## 2017-09-01 LAB — BASIC METABOLIC PANEL
ANION GAP: 7 (ref 5–15)
BUN: 17 mg/dL (ref 8–23)
CHLORIDE: 107 mmol/L (ref 98–111)
CO2: 26 mmol/L (ref 22–32)
Calcium: 8.4 mg/dL — ABNORMAL LOW (ref 8.9–10.3)
Creatinine, Ser: 0.57 mg/dL (ref 0.44–1.00)
GFR calc Af Amer: >60 mL/min (ref >60)
GFR calc non Af Amer: >60 mL/min (ref >60)
GLUCOSE: 109 mg/dL — AB (ref 70–99)
Potassium: 3.9 mmol/L (ref 3.5–5.1)
Sodium: 140 mmol/L (ref 135–145)

## 2017-09-01 LAB — GLUCOSE, CAPILLARY
GLUCOSE-CAPILLARY: 84 mg/dL (ref 70–99)
GLUCOSE-CAPILLARY: 195 mg/dL — AB (ref 70–99)
GLUCOSE-CAPILLARY: 171 mg/dL — AB (ref 70–99)
GLUCOSE-CAPILLARY: 179 mg/dL — AB (ref 70–99)
Glucose-Capillary: 157 mg/dL — ABNORMAL HIGH (ref 70–99)

## 2017-09-01 NOTE — Evaluation (Signed)
Physical Therapy Evaluation Patient Details Name: Brittney Wall MRN: [id number] DOB: [birth year] Today's Date: 09/01/2017   History of Present Illness  Brittney Wall is a 72 y.o. 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  She is noted to be more short of breath and weak according to her sister and her daughter.  Patient cannot give any significant history.  There is some question of whether or not she has been hypoxemic at home, but this is uncertain.  She is also noted to have some mild diarrhea and had stopped walking today due to her weakness.    Clinical Impression  Patient easily agitated and requires family members present to be more cooperative, limited to a few steps at bedside secondary to weakness and fearful of falling.  Patient required Max assist to reposition when put back to bed.  Patient will benefit from continued physical therapy in hospital and recommended venue below to increase strength, balance, endurance for safe ADLs and gait.    Follow Up Recommendations SNF;Supervision/Assistance - 24 hour    Equipment Recommendations  None recommended by PT    Recommendations for Other Services       Precautions / Restrictions Precautions Precautions: Fall Restrictions Weight Bearing Restrictions: No      Mobility  Bed Mobility Overal bed mobility: Needs Assistance Bed Mobility: Supine to Sit;Sit to Supine     Supine to sit: Mod assist Sit to supine: Mod assist   General bed mobility comments: mostly due to patient apprehensive and resists movement  Transfers Overall transfer level: Needs assistance Equipment used: 2 person hand held assist Transfers: Sit to/from Stand Sit to Stand: Max assist             Ambulation/Gait Ambulation/Gait assistance: Max assist Gait Distance (Feet): 2 Feet Assistive device: 2 person hand held assist Gait Pattern/deviations: Decreased step length - right;Decreased step length - left;Decreased stride length Gait velocity: slow   General Gait Details: limited to 2-3 steps before attempting to sit down due to c/o fatigue  Stairs            Wheelchair Mobility    Modified Rankin (Stroke Patients Only)       Balance Overall balance assessment: Needs assistance Sitting-balance support: Feet supported;No upper extremity supported Sitting balance-Leahy Scale: Fair     Standing balance support: During functional activity;Bilateral upper extremity supported Standing balance-Leahy Scale: Poor Standing balance comment: required 2 person assist by holding patient's hands                             Pertinent Vitals/Pain Pain Assessment: No/denies pain    Home Living Family/patient expects to be discharged to:: Private residence Living Arrangements: Children   Type of Home: House Home Access: Level entry     Home Layout: One level Home Equipment: Wheelchair - manual;Bedside commode;Shower seat;Hospital bed      Prior Function Level of Independence: Needs assistance   Gait / Transfers Assistance Needed: supervised household ambulator without assistive  ADL's / Homemaking Assistance Needed: assisted by family        Hand Dominance        Extremity/Trunk Assessment   Upper Extremity Assessment Upper Extremity Assessment: Generalized weakness    Lower Extremity Assessment Lower Extremity Assessment:  Generalized weakness    Cervical / Trunk Assessment Cervical / Trunk Assessment: Kyphotic  Communication   Communication: No difficulties  Cognition Arousal/Alertness: Awake/alert Behavior During Therapy: Agitated;Impulsive Overall Cognitive Status: History of cognitive impairments - at baseline Area of  Impairment: Safety/judgement;Following commands                       Following Commands: Follows one step commands consistently;Follows multi-step commands inconsistently Safety/Judgement: Decreased awareness of safety     General Comments: apprehensive      General Comments      Exercises     Assessment/Plan    PT Assessment Patient needs continued PT services  PT Problem List Decreased strength;Decreased activity tolerance;Decreased balance;Decreased mobility       PT Treatment Interventions Gait training;Stair training;Functional mobility training;Therapeutic activities;Therapeutic exercise;Patient/family education    PT Goals (Current goals can be found in the Care Plan section)  Acute Rehab PT Goals Patient Stated Goal: go home PT Goal Formulation: With patient/family Time For Goal Achievement: 09/18/17 Potential to Achieve Goals: Fair    Frequency Min 3X/week   Barriers to discharge        Co-evaluation               AM-PAC PT "6 Clicks" Daily Activity  Outcome Measure Difficulty turning over in bed (including adjusting bedclothes, sheets and blankets)?: A Lot Difficulty moving from lying on back to sitting on the side of the bed? : A Lot   Help needed moving to and from a bed to chair (including a wheelchair)?: A Lot Help needed walking in hospital room?: Total Help needed climbing 3-5 steps with a railing? : Total 6 Click Score: 8    End of Session Equipment Utilized During Treatment: Gait belt Activity Tolerance: Patient limited by fatigue;Treatment limited secondary to agitation Patient left: in bed;with call bell/phone within reach Nurse Communication: Mobility status;Other (comment)(RN notified that patient pulled out IV whe put back to bed) PT Visit Diagnosis: Unsteadiness on feet (R26.81);Other abnormalities of gait and mobility (R26.89);Muscle weakness (generalized) (M62.81)    Time: 7628-3151 PT Time Calculation (min) (ACUTE  ONLY): 26 min   Charges:   PT Evaluation $PT Eval Moderate Complexity: 1 Mod PT Treatments $Therapeutic Activity: 23-37 mins   PT G Codes:        2:39 PM, September 18, 2017 Lonell Grandchild, MPT Physical Therapist with [facility] [phone] office [phone] mobile phone

## 2017-09-01 NOTE — Care Management Important Message (Signed)
Important Message  Patient Details  Name: Brittney Wall MRN: [id number] Date of Birth: Jun 20, 1945   Medicare Important Message Given:  Yes    Shelda Altes 09/01/2017, 11:51 AM

## 2017-09-01 NOTE — Progress Notes (Signed)
Pharmacy Antibiotic Note  This is day #3 of Zosyn therapy for this 35 yof with aspiration pneumonia.  Patient is currently afebrile and blood cultures have shown no growth. Renal function appears fairly stable.   Plan: Zosyn 3.375g IV q8h (4 hour infusion).  Height: 5\' 3"  (160 cm) Weight: 97 lb (44 kg) IBW/kg (Calculated) : 52.4  Temp (24hrs), Avg:98 F (36.7 C), Min:97.6 F (36.4 C), Max:98.4 F (36.9 C)  Recent Labs  Lab 08/29/17 2220 08/29/17 2325 08/30/17 0029 08/30/17 0659 08/31/17 0600 09/01/17 0606  WBC 13.5*  --   --   --  5.6 6.8  CREATININE 0.85  --   --   --  0.65 0.57  LATICACIDVEN  --  2.52* 3.01* 1.3  --   --     Estimated Creatinine Clearance: 44.8 mL/min (by C-G formula based on SCr of 0.57 mg/dL).    No Known Allergies    Intake/Output Summary (Last 24 hours) at 09/01/2017 1422 Last data filed at 09/01/2017 0900 Gross per 24 hour  Intake 412.29 ml  Output -  Net 412.29 ml    Antimicrobials this admission:  7/19 azithromycin >> 7/20 7/19 ceftriaxone >> 7/20 7/20 zosyn >>   Microbiology results:  7/19 BCx: ng x3 days   Thank you for allowing pharmacy to be a part of this patient's care.  Despina Pole, Pharm. D. Clinical Pharmacist 09/01/2017 2:22 PM

## 2017-09-01 NOTE — Progress Notes (Signed)
PROGRESS NOTE                                                                                                                                                                                                             Patient Demographics:    Brittney Wall, is a 72 y.o. female, DOB - May 16, 1945, SVX:[id number]  Admit date - 08/29/2017   Admitting Physician [place], DO  Outpatient Primary MD for the patient is [name], MD  LOS - 2  Outpatient Specialists: None  Chief Complaint  Patient presents with  . Shortness of Breath       Brief Narrative   72 year old female with bipolar disorder, schizophrenia and advanced dementia with behavioral disturbances, severe protein calorie malnutrition type 2 diabetes mellitus, history of tobacco use presented with increasing shortness of breath.  She had a CT scan of her abdomen in March of this year for acute abdominal pain which incidentally showed new liver lesions.  Her PCP ordered for an MRI of the abdomen done in April which showed 3 heterogeneous hypervascular liver masses highly suspicious for hepatic metastasis.  A CT of the chest and soft tissue neck with contrast which showed some postobstructive pneumonia process and emphysema and interval increase in right liver mass lesion and an adrenal lesion suggestive of metastatic disease.  There were also hypodense thyroid nodules bilaterally (unchanged from prior CT).  CT chest showed sub-solid 3 mm nodule in the lower aspect of right upper lobe.  The CT of the chest and soft tissue of the neck was done in June. Patient admitted for postobstructive pneumonia.  Oncology consulted for further evaluation and recommendations.    Subjective:   Patient is very confused and restless.  Unable to provide any history.  Daughters at bedside who report noticing some weakness in her left leg.   Assessment  & Plan :   Principal  problem Generalized weakness with postobstructive versus aspiration pneumonia Suspect progression of metastatic disease of unknown primary.  On empiric Zosyn and remains afebrile.  Continue aspiration precaution. Continue PRN nebs.  PT recommends SNF.   Metastatic disease of unknown primary. Oncology consult appreciated.  Given presentation of metastatic disease with failure to thrive, advanced dementia and poor performance status, he recommends against doing a biopsy of the liver since patient is not a candidate for  any active therapy even if we have a diagnosis of malignancy.  He recommends s palliative care.  Consult placed.  He had discussed this with detail and with the family who agree.  COPD Continue PRN neb.  Oxygen as tolerated.  Hypokalemia Replenished  Dyslipidemia Continue statin  Type 2 diabetes mellitus, controlled Holding home metformin and monitor on sliding scale coverage  Bipolar disorder/schizophrenia/dementia with behavioral disturbance Continue Remeron, PRN Haldol and Ativan.  ?Left leg weakness Has good tone and reflex but unable to perform good neurological exam as patient is very confused and not cooperative. Will await palliative care discussion.  Code Status : DNR  Family Communication  : Daughters at bedside  Disposition Plan  : Possible SNF   Barriers For Discharge : pending palliative care discussion for goals of care  Consults  : Oncology, palliative care  Procedures  : None  DVT Prophylaxis  :  Lovenox -   Lab Results  Component Value Date   PLT 235 09/01/2017    Antibiotics  :    Anti-infectives (From admission, onward)   Start     Dose/Rate Route Frequency Ordered Stop   08/30/17 1000  piperacillin-tazobactam (ZOSYN) IVPB 3.375 g     3.375 g 12.5 mL/hr over 240 Minutes Intravenous Every 8 hours 08/30/17 0954     08/30/17 0800  piperacillin-tazobactam (ZOSYN) IVPB 3.375 g  Status:  Discontinued     3.375 g 12.5 mL/hr over 240  Minutes Intravenous Every 8 hours 08/30/17 0752 08/30/17 0954   08/30/17 0130  piperacillin-tazobactam (ZOSYN) IVPB 3.375 g     3.375 g 12.5 mL/hr over 240 Minutes Intravenous  Once 08/30/17 0122 08/30/17 0730   08/29/17 2230  cefTRIAXone (ROCEPHIN) 2 g in sodium chloride 0.9 % 100 mL IVPB  Status:  Discontinued     2 g 200 mL/hr over 30 Minutes Intravenous Every 24 hours 08/29/17 2216 08/30/17 0115   08/29/17 2230  azithromycin (ZITHROMAX) 500 mg in sodium chloride 0.9 % 250 mL IVPB  Status:  Discontinued     500 mg 250 mL/hr over 60 Minutes Intravenous Every 24 hours 08/29/17 2216 08/30/17 0115        Objective:   Vitals:   08/31/17 2009 08/31/17 2040 09/01/17 0521 09/01/17 0755  BP:  (!) 171/75 (!) 174/82   Pulse:   81   Resp:  16 16   Temp:  97.6 F (36.4 C) 98.4 F (36.9 C)   TempSrc:  Oral Oral   SpO2: 98% 97% 97% 96%  Weight:      Height:        Wt Readings from Last 3 Encounters:  08/29/17 44 kg (97 lb)  07/24/17 44 kg (97 lb)  07/17/17 44 kg (97 lb)     Intake/Output Summary (Last 24 hours) at 09/01/2017 1833 Last data filed at 09/01/2017 0900 Gross per 24 hour  Intake 120 ml  Output -  Net 120 ml     Physical Exam  Gen: Restless, confused HEENT: Temporal wasting, moist mucosa Chest: clear b/l, no added sounds CVS: N S1&S2, no murmurs, GI: soft, NT, ND,  Musculoskeletal: warm, no edema     Data Review:    CBC Recent Labs  Lab 08/29/17 2220 08/31/17 0600 09/01/17 0606  WBC 13.5* 5.6 6.8  HGB 14.0 10.2* 11.3*  HCT 41.3 31.4* 35.1*  PLT 218 206 235  MCV 89.0 89.0 89.1  MCH 30.2 28.9 28.7  MCHC 33.9 32.5 32.2  RDW 14.5 14.6 14.3  LYMPHSABS 0.5*  --   --   MONOABS 0.4  --   --   EOSABS 0.0  --   --   BASOSABS 0.0  --   --     Chemistries  Recent Labs  Lab 08/29/17 2220 08/30/17 0659 08/31/17 0600 09/01/17 0606  NA 134*  --  141 140  K 3.1*  --  3.6 3.9  CL 99  --  110 107  CO2 25  --  26 26  GLUCOSE 189*  --  129* 109*  BUN  38*  --  24* 17  CREATININE 0.85  --  0.65 0.57  CALCIUM 9.2  --  8.4* 8.4*  MG  --  2.0  --   --   AST 53*  --   --   --   ALT 23  --   --   --   ALKPHOS 80  --   --   --   BILITOT 1.2  --   --   --    ------------------------------------------------------------------------------------------------------------------ No results for input(s): CHOL, HDL, LDLCALC, TRIG, CHOLHDL, LDLDIRECT in the last 72 hours.  Lab Results  Component Value Date   HGBA1C 5.7 (H) 07/24/2017   ------------------------------------------------------------------------------------------------------------------ No results for input(s): TSH, T4TOTAL, T3FREE, THYROIDAB in the last 72 hours.  Invalid input(s): FREET3 ------------------------------------------------------------------------------------------------------------------ No results for input(s): VITAMINB12, FOLATE, FERRITIN, TIBC, IRON, RETICCTPCT in the last 72 hours.  Coagulation profile No results for input(s): INR, PROTIME in the last 168 hours.  No results for input(s): DDIMER in the last 72 hours.  Cardiac Enzymes No results for input(s): CKMB, TROPONINI, MYOGLOBIN in the last 168 hours.  Invalid input(s): CK ------------------------------------------------------------------------------------------------------------------    Component Value Date/Time   BNP 188.0 (H) 06/14/2016 1623    Inpatient Medications  Scheduled Meds: . acetylcysteine  2 mL Nebulization BID  . budesonide (PULMICORT) nebulizer solution  0.25 mg Nebulization BID  . cloNIDine  0.1 mg Oral TID  . enoxaparin (LOVENOX) injection  30 mg Subcutaneous Q24H  . haloperidol  5 mg Oral QHS  . hydrALAZINE  10 mg Oral Q8H  . insulin aspart  0-9 Units Subcutaneous TID WC  . ipratropium-albuterol  3 mL Nebulization BID  . minoxidil  5 mg Oral BID  . mirtazapine  30 mg Oral QHS  . pravastatin  20 mg Oral QHS   Continuous Infusions: . piperacillin-tazobactam (ZOSYN)  IV  Stopped (09/01/17 1832)   PRN Meds:.acetaminophen **OR** acetaminophen, HYDROcodone-acetaminophen, ipratropium-albuterol, LORazepam, morphine injection, ondansetron **OR** ondansetron (ZOFRAN) IV, traZODone  Micro Results Recent Results (from the past 240 hour(s))  Blood Culture (routine x 2)     Status: None (Preliminary result)   Collection Time: 08/29/17 10:21 PM  Result Value Ref Range Status   Specimen Description LEFT ANTECUBITAL  Final   Special Requests   Final    BOTTLES DRAWN AEROBIC ONLY Blood Culture adequate volume   Culture   Final    NO GROWTH 3 DAYS Performed at [facility], [address] [place], [place] [address]    Report Status PENDING  Incomplete  Blood Culture (routine x 2)     Status: None (Preliminary result)   Collection Time: 08/29/17 11:00 PM  Result Value Ref Range Status   Specimen Description BLOOD LEFT ARM  Final   Special Requests   Final    BOTTLES DRAWN AEROBIC ONLY Blood Culture adequate volume   Culture   Final    NO GROWTH 3 DAYS Performed at [facility]  [facility], [address], [place], [place] [address]    Report Status PENDING  Incomplete    Radiology Reports Dg Chest 2 View  Result Date: 08/29/2017 CLINICAL DATA:  Initial evaluation for acute shortness of breath. Cough. EXAM: CHEST - 2 VIEW COMPARISON:  Prior CT from 07/25/2014. FINDINGS: Cardiomegaly, stable from previous. Mediastinal silhouette within normal limits. Aortic atherosclerosis. Lungs are hyperinflated with underlying emphysematous changes. Patchy opacities at the inferior right upper and left lower lobes, which could reflect atelectasis or infiltrates. Small left pleural effusion. No pulmonary edema. No pneumothorax. No acute osseus abnormality. IMPRESSION: 1. Patchy multifocal opacities involving the right upper and left lower lobes, which could reflect atelectasis or infiltrates. 2. Small left pleural effusion. 3. Underlying emphysema. 4. Cardiomegaly with aortic atherosclerosis.  Electronically Signed   By: Jeannine Boga M.D.   On: 08/29/2017 21:34    Time Spent in minutes  25   Samyukta Cura M.D on 09/01/2017 at 6:33 PM  Between 7am to 7pm - Pager - [phone]  After 7pm go to [email or web address] - password [facility]  [facility] -  Office  [phone]

## 2017-09-01 NOTE — Progress Notes (Signed)
Patient family concerned that patient is not moving her left leg.  Patient daughter states that she was able to move leg before admission and daughter is wondering if leg has suffered injury.Patient daughter expresses concern over patient diagnosis and and disease process.

## 2017-09-01 NOTE — Evaluation (Signed)
Clinical/Bedside Swallow Evaluation Patient Details  Name: Brittney Wall MRN: [id number] Date of Birth: [birth year]  Today's Date: 09/01/2017 Time: SLP Start Time (ACUTE ONLY): 5809 SLP Stop Time (ACUTE ONLY): 1800 SLP Time Calculation (min) (ACUTE ONLY): 35 min  Past Medical History:  Past Medical History:  Diagnosis Date  . Bipolar 1 disorder ([place])   . Dementia   . Hypertension   . Left adrenal mass ([place]) 09/06/2016  . Pneumothorax on right 09/07/2016  . Schizophrenia [facility])    Past Surgical History:  Past Surgical History:  Procedure Laterality Date  . BOWEL RESECTION  12/23/2016   Procedure: SMALL BOWEL RESECTION;  Surgeon: Virl Cagey, MD;  Location: [facility];  Service: General;;  . Fatima Blank HERNIA REPAIR  01/04/2017   Procedure: Jerry Caras WITH MESH;  Surgeon: Aviva Signs, MD;  Location: [facility];  Service: General;;  . LAPAROTOMY N/A 12/23/2016   Procedure: EXPLORATORY LAPAROTOMY;  Surgeon: Virl Cagey, MD;  Location: [facility];  Service: General;  Laterality: N/A;  . ortho procedure     for broken legs or ankles daughter unsure  . RADIOLOGY WITH ANESTHESIA N/A 06/03/2017   Procedure: MRI WITH ANESTHESIA LIVER WITH AND WITHOUT CONTRAST;  Surgeon: Radiologist, Medication, MD;  Location: [place];  Service: Radiology;  Laterality: N/A;  . 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 She is noted to be more short of breath and weak according  to her sister and her daughter.She was admitted with suspected aspiration pneumonia.Chest x-ray shows: Lungs are hyperinflated with underlying emphysematous changes. Patchy opacities at the inferior right upper and left lower lobes, which could reflect atelectasis or infiltrates. Small left pleural effusion. No pulmonary edema. No pneumothorax. BSE requested.   Assessment / Plan / Recommendation Clinical Impression  Pt seen at bedside for clinical swallow evaluation with many family members in room present. The patient's family was very vocal throughout the visit. They state that the Pt "loves to eat" and has "no problems eating". SLP explained reason for evaluation, repositioned Pt upright, and attempted oral care. Pt swatted at SLP several times and did not follow commands for oral motor evaluation. Pt assessed with dinner meal and demonstrated prolonged mastication and oral pocketing with mech soft textures. She frequently grabbed for more items and needed max cues for rate and portion size control. When allowed to feed herself, she stuffed half of a grilled cheese sandwich in her mouth. Pt had one episode of delayed coughing after sequential straw sips thin water over 240 ml. SLP recommended D2/chopped with moisture/gravies added to meats as Pt expectorates them when dry. Also reinforced aspiration and reflux precautions with stress on having Pt sit upright for all eating/drinking, assist Pt for portion size/rate control due to impulsivity, and to clean out oral cavity after meals due to pocketing. SLP will follow for diet tolerance and need for objective assessment if MD desires.   SLP Visit Diagnosis: Dysphagia, unspecified (R13.10)    Aspiration Risk  Mild aspiration risk;Moderate aspiration risk    Diet Recommendation Dysphagia 2 (Fine chop);Thin liquid  Liquid Administration via: Cup;Straw Medication Administration: Crushed with puree Supervision: Staff to assist with self feeding;Full  supervision/cueing for compensatory strategies Compensations: Minimize environmental distractions;Slow rate;Small sips/bites;Lingual sweep for clearance of pocketing(ensure clear oral cavity at end of meal) Postural Changes: Seated upright at 90 degrees;Remain upright for at least 30 minutes after po intake    Other  Recommendations Oral Care Recommendations: Staff/trained caregiver to provide oral care;Oral care before and after PO Other Recommendations: Clarify dietary restrictions   Follow up Recommendations None      Frequency and Duration min 2x/week  1 week       Prognosis Prognosis for Safe Diet Advancement: Fair Barriers to Reach Goals: Cognitive deficits;Behavior      Swallow Study   General Date of Onset: 08/29/17 HPI: Brittney Yogi Tottenis a 72 y.o.femalewith medical history significant forbipolar disorder, schizophrenia, dementia with behavioral disturbances, severe protein calorie malnutrition, hypertension, type 2 diabetes, dyslipidemia, prior tobacco abuse, and multiple metastatic lesions to lungs, liver, and adrenals with pending biopsy of liver. She is noted to be more short of breath and weak according to her sister and her daughter.She was admitted with suspected aspiration pneumonia.Chest x-ray shows: Lungs are hyperinflated with underlying emphysematous changes. Patchy opacities at the inferior right upper and left lower lobes, which could reflect atelectasis or infiltrates. Small left pleural effusion. No pulmonary edema. No pneumothorax. BSE requested. Type of Study: Bedside Swallow Evaluation Previous Swallow Assessment: None Diet Prior to this Study: Dysphagia 3 (soft);Thin liquids Temperature Spikes Noted: No Respiratory Status: Nasal cannula History of Recent Intubation: No Behavior/Cognition: Alert;Cooperative;Requires cueing;Doesn't follow directions Oral Cavity Assessment: Within Functional Limits Oral Care Completed by SLP: No(Pt will not  allow) Oral Cavity - Dentition: Edentulous Vision: Functional for self-feeding Self-Feeding Abilities: Able to feed self(Pt extremely impulsive) Patient Positioning: Upright in chair Baseline Vocal Quality: Normal Volitional Cough: Cognitively unable to elicit Volitional Swallow: Able to elicit    Oral/Motor/Sensory Function Overall Oral Motor/Sensory Function: Mild impairment   Ice Chips Ice chips: Not tested   Thin Liquid Thin Liquid: Within functional limits Presentation: Straw;Self Fed    Nectar Thick Nectar Thick Liquid: Not tested   Honey Thick Honey Thick Liquid: Not tested   Puree Puree: Within functional limits Presentation: Spoon   Solid   GO   Solid: Impaired Presentation: Self Fed;Spoon Oral Phase Impairments: Impaired mastication Oral Phase Functional Implications: Left lateral sulci pocketing;Prolonged oral transit;Oral residue       Thank you,  Genene Churn, [place]  Gianfranco Araki 09/01/2017,6:10 PM
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
## 2017-09-01 NOTE — Clinical Social Work Note (Signed)
Patient's daughter, [name], states that she will be taking patient home.  She is agreeable to [facility] PT. Case management is aware.   LCSW signing off.     [place] Gamero, Clydene Pugh, LCSW r

## 2017-09-01 NOTE — Plan of Care (Signed)
  Problem: Acute Rehab PT Goals(only PT should resolve) Goal: Pt Will Go Supine/Side To Sit Outcome: Progressing Flowsheets (Taken 09/01/2017 1442) Pt will go Supine/Side to Sit: with min guard assist Goal: Patient Will Transfer Sit To/From Stand Outcome: Progressing Flowsheets (Taken 09/01/2017 1442) Patient will transfer sit to/from stand: with minimal assist Goal: Pt Will Transfer Bed To Chair/Chair To Bed Outcome: Progressing Flowsheets (Taken 09/01/2017 1442) Pt will Transfer Bed to Chair/Chair to Bed: with mod assist Goal: Pt Will Ambulate Outcome: Progressing Flowsheets (Taken 09/01/2017 1442) Pt will Ambulate: with moderate assist;50 feet;with rolling walker Note:  Hand held assist or RW   2:43 PM, 09/01/17 Lonell Grandchild, MPT Physical Therapist with [facility] [phone] office [phone] mobile phone

## 2017-09-02 ENCOUNTER — Encounter (HOSPITAL_COMMUNITY): Payer: Self-pay | Admitting: Primary Care

## 2017-09-02 DIAGNOSIS — Z515 Encounter for palliative care: Secondary | ICD-10-CM

## 2017-09-02 DIAGNOSIS — Z7189 Other specified counseling: Secondary | ICD-10-CM

## 2017-09-02 DIAGNOSIS — I160 Hypertensive urgency: Secondary | ICD-10-CM

## 2017-09-02 LAB — GLUCOSE, CAPILLARY
GLUCOSE-CAPILLARY: 167 mg/dL — AB (ref 70–99)
Glucose-Capillary: 80 mg/dL (ref 70–99)
Glucose-Capillary: 138 mg/dL — ABNORMAL HIGH (ref 70–99)
Glucose-Capillary: 126 mg/dL — ABNORMAL HIGH (ref 70–99)

## 2017-09-02 LAB — AFP TUMOR MARKER: AFP, SERUM, TUMOR MARKER: 1.9 ng/mL (ref 0.0–8.3)

## 2017-09-02 LAB — CA 125: Cancer Antigen (CA) 125: 19.4 U/mL (ref 0.0–38.1)

## 2017-09-02 LAB — CEA: CEA: 4 ng/mL (ref 0.0–4.7)

## 2017-09-02 MED ORDER — HYDRALAZINE HCL 25 MG PO TABS
25.0000 mg | ORAL_TABLET | Freq: Three times a day (TID) | ORAL | Status: DC
  Administered 2017-09-02: 25 mg via ORAL
  Filled 2017-09-02: qty 1

## 2017-09-02 MED ORDER — AMOXICILLIN-POT CLAVULANATE 875-125 MG PO TABS
1.0000 | ORAL_TABLET | Freq: Two times a day (BID) | ORAL | Status: DC
  Administered 2017-09-02 – 2017-09-03 (×2): 1 via ORAL
  Filled 2017-09-02 (×2): qty 1

## 2017-09-02 MED ORDER — HYDRALAZINE HCL 25 MG PO TABS
50.0000 mg | ORAL_TABLET | Freq: Three times a day (TID) | ORAL | Status: DC
  Administered 2017-09-02 – 2017-09-03 (×3): 50 mg via ORAL
  Filled 2017-09-02 (×3): qty 2

## 2017-09-02 MED ORDER — HYDRALAZINE HCL 20 MG/ML IJ SOLN
10.0000 mg | Freq: Once | INTRAMUSCULAR | Status: AC
  Administered 2017-09-02: 10 mg via INTRAVENOUS
  Filled 2017-09-02: qty 1

## 2017-09-02 MED ORDER — HYDRALAZINE HCL 20 MG/ML IJ SOLN
10.0000 mg | Freq: Four times a day (QID) | INTRAMUSCULAR | Status: DC | PRN
  Filled 2017-09-02: qty 1

## 2017-09-02 MED ORDER — LORAZEPAM 2 MG/ML IJ SOLN
0.5000 mg | Freq: Once | INTRAMUSCULAR | Status: AC
  Administered 2017-09-02: 0.5 mg via INTRAVENOUS
  Filled 2017-09-02: qty 1

## 2017-09-02 NOTE — Progress Notes (Signed)
PROGRESS NOTE                                                                                                                                                                                                             Patient Demographics:    Brittney Wall, is a 72 y.o. female, DOB - [birth year], YYT:[id number]  Admit date - 08/29/2017   Admitting Physician Glorieta, DO  Outpatient Primary MD for the patient is Brittney Beard, MD  LOS - 3  Outpatient Specialists: None  Chief Complaint  Patient presents with  . Shortness of Breath       Brief Narrative   72 year old female with bipolar disorder, schizophrenia and advanced dementia with behavioral disturbances, severe protein calorie malnutrition type 2 diabetes mellitus, history of tobacco use presented with increasing shortness of breath.  She had a CT scan of her abdomen in March of this year for acute abdominal pain which incidentally showed new liver lesions.  Her PCP ordered for an MRI of the abdomen done in April which showed 3 heterogeneous hypervascular liver masses highly suspicious for hepatic metastasis.  A CT of the chest and soft tissue neck with contrast which showed some postobstructive pneumonia process and emphysema and interval increase in right liver mass lesion and an adrenal lesion suggestive of metastatic disease.  There were also hypodense thyroid nodules bilaterally (unchanged from prior CT).  CT chest showed sub-solid 3 mm nodule in the lower aspect of right upper lobe.  The CT of the chest and soft tissue of the neck was done in June. Patient admitted for postobstructive pneumonia.  Oncology consulted for further evaluation and recommendations.    Subjective:   Patient remains confused and restless.  Blood pressure elevated.  Assessment  & Plan :   Principal problem Generalized weakness with postobstructive versus aspiration pneumonia Suspect  progression of metastatic disease of unknown primary.  Narrow antibiotic to Augmentin..  Continue aspiration precaution. Continue PRN nebs.     Metastatic disease of unknown primary. Oncology consult appreciated.  Given presentation of metastatic disease with failure to thrive, advanced dementia and poor performance status, he recommends against doing a biopsy of the liver since patient is not a candidate for any active therapy even if we have a diagnosis of malignancy.  Recommended palliative care.  As per discussion with patient's  daughters patient will possibly be discharged home with hospice tomorrow ( daughter Brittney Wall is going to meet with palliative care in the morning to finalize home with H vs home with hospice; as per today's discussion they are inclined towards home hospice)  Uncontrolled hypertension/hypertensive urgency Increased hydralazine dose.  Continue clonidine.  Added PRN IV hydralazine.  COPD Continue PRN neb.  Oxygen as tolerated.  Hypokalemia Replenished  Dyslipidemia Continue statin  Type 2 diabetes mellitus, controlled Holding home metformin and monitor on sliding scale coverage  Bipolar disorder/schizophrenia/dementia with behavioral disturbance Continue Remeron, PRN Haldol and Ativan.  ?Left leg weakness Has good tone and reflex but unable to perform good neurological exam as patient is very confused and not cooperative. Given plan on palliative measures, no further w/up to be done.   Code Status : DNR  Family Communication  : Daughter at bedside  Disposition Plan  : possible home with home hospice tomorrow after final meeting with palliative care.   Barriers For Discharge : pending palliative care discussion for goals of care  Consults  : Oncology, palliative care  Procedures  : None  DVT Prophylaxis  :  Lovenox -   Lab Results  Component Value Date   PLT 235 09/01/2017    Antibiotics  :    Anti-infectives (From admission, onward)   Start      Dose/Rate Route Frequency Ordered Stop   08/30/17 1000  piperacillin-tazobactam (ZOSYN) IVPB 3.375 g     3.375 g 12.5 mL/hr over 240 Minutes Intravenous Every 8 hours 08/30/17 0954     08/30/17 0800  piperacillin-tazobactam (ZOSYN) IVPB 3.375 g  Status:  Discontinued     3.375 g 12.5 mL/hr over 240 Minutes Intravenous Every 8 hours 08/30/17 0752 08/30/17 0954   08/30/17 0130  piperacillin-tazobactam (ZOSYN) IVPB 3.375 g     3.375 g 12.5 mL/hr over 240 Minutes Intravenous  Once 08/30/17 0122 08/30/17 0730   08/29/17 2230  cefTRIAXone (ROCEPHIN) 2 g in sodium chloride 0.9 % 100 mL IVPB  Status:  Discontinued     2 g 200 mL/hr over 30 Minutes Intravenous Every 24 hours 08/29/17 2216 08/30/17 0115   08/29/17 2230  azithromycin (ZITHROMAX) 500 mg in sodium chloride 0.9 % 250 mL IVPB  Status:  Discontinued     500 mg 250 mL/hr over 60 Minutes Intravenous Every 24 hours 08/29/17 2216 08/30/17 0115        Objective:   Vitals:   09/02/17 0828 09/02/17 0836 09/02/17 0854 09/02/17 1457  BP:   (!) 217/96 (!) 202/88  Pulse:    (!) 121  Resp:    16  Temp:    98.6 F (37 C)  TempSrc:    Oral  SpO2: 90% 90%  95%  Weight:      Height:        Wt Readings from Last 3 Encounters:  09/02/17 98.7 kg (217 lb 9.6 oz)  07/24/17 44 kg (97 lb)  07/17/17 44 kg (97 lb)     Intake/Output Summary (Last 24 hours) at 09/02/2017 1506 Last data filed at 09/02/2017 0500 Gross per 24 hour  Intake 240 ml  Output 1200 ml  Net -960 ml   physical exam: Elderly female lying restless in bed, confused HEENT: Moist mucosa, temporal wasting Chest: Clear bilaterally CVS: S1-S2 regular, 3/6 systolic murmur GI: Soft, nondistended, nontender Musculoskeletal: Warm, no edema      Data Review:    CBC Recent Labs  Lab 08/29/17 2220 08/31/17 0600 09/01/17  0606  WBC 13.5* 5.6 6.8  HGB 14.0 10.2* 11.3*  HCT 41.3 31.4* 35.1*  PLT 218 206 235  MCV 89.0 89.0 89.1  MCH 30.2 28.9 28.7  MCHC 33.9 32.5  32.2  RDW 14.5 14.6 14.3  LYMPHSABS 0.5*  --   --   MONOABS 0.4  --   --   EOSABS 0.0  --   --   BASOSABS 0.0  --   --     Chemistries  Recent Labs  Lab 08/29/17 2220 08/30/17 0659 08/31/17 0600 09/01/17 0606  NA 134*  --  141 140  K 3.1*  --  3.6 3.9  CL 99  --  110 107  CO2 25  --  26 26  GLUCOSE 189*  --  129* 109*  BUN 38*  --  24* 17  CREATININE 0.85  --  0.65 0.57  CALCIUM 9.2  --  8.4* 8.4*  MG  --  2.0  --   --   AST 53*  --   --   --   ALT 23  --   --   --   ALKPHOS 80  --   --   --   BILITOT 1.2  --   --   --    ------------------------------------------------------------------------------------------------------------------ No results for input(s): CHOL, HDL, LDLCALC, TRIG, CHOLHDL, LDLDIRECT in the last 72 hours.  Lab Results  Component Value Date   HGBA1C 5.7 (H) 07/24/2017   ------------------------------------------------------------------------------------------------------------------ No results for input(s): TSH, T4TOTAL, T3FREE, THYROIDAB in the last 72 hours.  Invalid input(s): FREET3 ------------------------------------------------------------------------------------------------------------------ No results for input(s): VITAMINB12, FOLATE, FERRITIN, TIBC, IRON, RETICCTPCT in the last 72 hours.  Coagulation profile No results for input(s): INR, PROTIME in the last 168 hours.  No results for input(s): DDIMER in the last 72 hours.  Cardiac Enzymes No results for input(s): CKMB, TROPONINI, MYOGLOBIN in the last 168 hours.  Invalid input(s): CK ------------------------------------------------------------------------------------------------------------------    Component Value Date/Time   BNP 188.0 (H) 06/14/2016 1623    Inpatient Medications  Scheduled Meds: . acetylcysteine  2 mL Nebulization BID  . budesonide (PULMICORT) nebulizer solution  0.25 mg Nebulization BID  . cloNIDine  0.1 mg Oral TID  . enoxaparin (LOVENOX) injection  30 mg  Subcutaneous Q24H  . haloperidol  5 mg Oral QHS  . hydrALAZINE  25 mg Oral Q8H  . insulin aspart  0-9 Units Subcutaneous TID WC  . ipratropium-albuterol  3 mL Nebulization BID  . minoxidil  5 mg Oral BID  . mirtazapine  30 mg Oral QHS  . pravastatin  20 mg Oral QHS   Continuous Infusions: . piperacillin-tazobactam (ZOSYN)  IV 3.375 g (09/02/17 1420)   PRN Meds:.acetaminophen **OR** acetaminophen, hydrALAZINE, HYDROcodone-acetaminophen, ipratropium-albuterol, LORazepam, morphine injection, ondansetron **OR** ondansetron (ZOFRAN) IV, traZODone  Micro Results Recent Results (from the past 240 hour(s))  Blood Culture (routine x 2)     Status: None (Preliminary result)   Collection Time: 08/29/17 10:21 PM  Result Value Ref Range Status   Specimen Description LEFT ANTECUBITAL  Final   Special Requests   Final    BOTTLES DRAWN AEROBIC ONLY Blood Culture adequate volume   Culture   Final    NO GROWTH 4 DAYS Performed at [facility], [address], [place], [place] [address]    Report Status PENDING  Incomplete  Blood Culture (routine x 2)     Status: None (Preliminary result)   Collection Time: 08/29/17 11:00 PM  Result Value Ref Range Status  Specimen Description BLOOD LEFT ARM  Final   Special Requests   Final    BOTTLES DRAWN AEROBIC ONLY Blood Culture adequate volume   Culture   Final    NO GROWTH 4 DAYS Performed at [facility], [address] [place], [place] [address]    Report Status PENDING  Incomplete    Radiology Reports Dg Chest 2 View  Result Date: 08/29/2017 CLINICAL DATA:  Initial evaluation for acute shortness of breath. Cough. EXAM: CHEST - 2 VIEW COMPARISON:  Prior CT from 07/25/2014. FINDINGS: Cardiomegaly, stable from previous. Mediastinal silhouette within normal limits. Aortic atherosclerosis. Lungs are hyperinflated with underlying emphysematous changes. Patchy opacities at the inferior right upper and left lower lobes, which could reflect atelectasis or  infiltrates. Small left pleural effusion. No pulmonary edema. No pneumothorax. No acute osseus abnormality. IMPRESSION: 1. Patchy multifocal opacities involving the right upper and left lower lobes, which could reflect atelectasis or infiltrates. 2. Small left pleural effusion. 3. Underlying emphysema. 4. Cardiomegaly with aortic atherosclerosis. Electronically Signed   By: Jeannine Boga M.D.   On: 08/29/2017 21:34    Time Spent in minutes  25   Tommye Lehenbauer M.D on 09/02/2017 at 3:06 PM  Between 7am to 7pm - Pager - [phone]  After 7pm go to [email or web address] - password [facility]  [facility] -  Office  [phone]

## 2017-09-02 NOTE — Progress Notes (Signed)
BP still elevated after apresoline.  Contacted Dr, Clementeen Graham

## 2017-09-02 NOTE — Progress Notes (Signed)
Physical Therapy Treatment Patient Details Name: Brittney Wall MRN: [id number] DOB: 1945-09-22 Today's Date: 09/02/2017    History of Present Illness Brittney Wall is a 72 y.o. 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  She is noted to be more short of breath and weak according to her sister and her daughter.  Patient cannot give any significant history.  There is some question of whether or not she has been hypoxemic at home, but this is uncertain.  She is also noted to have some mild diarrhea and had stopped walking today due to her weakness.    PT Comments    PT is a poor rehab candidate due to dementia.    Follow Up Recommendations  Supervision/Assistance - 24 hour     Equipment Recommendations  None recommended by PT    Recommendations for Other Services       Precautions / Restrictions Precautions Precautions: Fall Restrictions Weight Bearing Restrictions: No    Mobility  Bed Mobility Overal bed mobility: Needs Assistance Bed Mobility: Supine to Sit     Supine to sit: Min assist Sit to supine: Supervision   General bed mobility comments: mostly due to patient apprehensive and resists movement  Transfers Overall transfer level: Needs assistance Equipment used: 2 person hand held assist Transfers: Sit to/from Stand Sit to Stand: Mod assist            Ambulation/Gait Ambulation/Gait assistance: Max assist   Assistive device: 2 person hand held assist Gait Pattern/deviations: Decreased step length - right;Decreased step length - left;Decreased stride length Gait velocity: slow   General Gait Details: limited to 2-3 steps before attempting to sit down has had recent pain medication and  states she wants to go to sleep          Cognition Arousal/Alertness: Awake/alert Behavior During Therapy: Agitated;Impulsive Overall Cognitive Status: History of cognitive impairments - at baseline Area of Impairment: Safety/judgement;Following commands                       Following Commands: Follows one step commands consistently;Follows multi-step commands inconsistently Safety/Judgement: Decreased awareness of safety     General Comments: apprehensive             Pertinent Vitals/Pain Pain Assessment: No/denies pain           PT Goals (current goals can now be found in the care plan section) Acute Rehab PT Goals Patient Stated Goal: go home PT Goal Formulation: With patient/family Time For Goal Achievement: 09/18/17 Potential to Achieve Goals: Fair Progress towards PT goals: Not progressing toward goals - comment    Frequency    Min 3X/week      PT Plan  Continue to increase pt activity        AM-PAC PT "6 Clicks" Daily Activity  Outcome Measure                   End of Session Equipment Utilized During Treatment: Gait belt Activity Tolerance: Treatment limited secondary to agitation Patient left: in bed;with call bell/phone within reach;with bed alarm set Nurse Communication: Mobility status;Other (comment)(RN notified that patient pulled out IV whe put back to bed) PT Visit Diagnosis: Unsteadiness on feet (R26.81);Other abnormalities of gait and mobility (R26.89);Muscle weakness (generalized) (M62.81)  Time: 1100-1115 PT Time Calculation (min) (ACUTE ONLY): 15 min  Charges:  $Therapeutic Activity: 8-22 mins                    G CodesRayetta Humphrey, PT CLT [phone] 09/02/2017, 11:16 AM

## 2017-09-02 NOTE — Consult Note (Signed)
Consultation Note Date: 09/02/2017   Patient Name: Brittney Wall  DOB: 07-11-1945  MRN: [id number]  Age / Sex: 72 y.o., female  PCP: Brittney Beard, MD Referring Physician: Louellen Molder, MD  Reason for Consultation: Establishing goals of care  HPI/Patient Profile: 72 y.o. female  with past medical history of schizophrenia, dementia, bipolar disorder, hypertension admitted on 08/29/2017 with generalized weakness with postobstructive versus aspiration pneumonia.   Clinical Assessment and Goals of Care: Brittney Wall is sitting up in bed.  She greets me making and keeping eye contact.  She appears quite frail, but pleasant.  She is clearly demented.  Present today at bedside his daughter Brittney Wall.  We talked about [name]'s current health concerns and her disposition.  I share a diagram of the services provided by home health versus hospice.  Brittney Wall shares that her preference is in-home hospice services, but her Sister Brittney Wall is the primary responsible party. Call to daughter, Brittney Wall.  We talked about the benefits of in-home hospice services.  Brittney Wall agrees to a family meeting tomorrow morning, to continue discussions related to in-home hospice services and rehospitalization.  HC POA NEXT OF KIN - daughter Brittney Wall (eh Brittney Wall) is Durable POA but not HC POA.  There is no HCPOA paperwork noted in chart.  There is NO legal guardianship paperwork in chart.    SUMMARY OF RECOMMENDATIONS   At this point, continue to treat the treatable but no CPR, no intubation. Continue discussions related to in-home hospice care and rehospitalization.  Code Status/Advance Care Planning:  DNR  Symptom Management:   Per hospitalist, no additional needs.   Palliative Prophylaxis:   Aspiration  Additional Recommendations (Limitations, Scope, Preferences):  treat the treatable, but no CPR, life support.    Psycho-social/Spiritual:   Desire for further Chaplaincy support:no  Additional Recommendations: Caregiving  Support/Resources and Education on Hospice  Prognosis:   < 6 months or less would not be surprising based on frailty, poor PO intake.   Discharge Planning: To be determined, family meeting tomorrow morning.  Continue in-home hospice discussions.      Primary Diagnoses: Present on Admission: . Hypokalemia . Schizophrenia ([place]) . Protein-calorie malnutrition, severe ([place]) . Left adrenal mass ([place])   I have reviewed the medical record, interviewed the patient and family, and examined the patient. The following aspects are pertinent.  Past Medical History:  Diagnosis Date  . Bipolar 1 disorder ([place])   . Dementia   . Hypertension   . Left adrenal mass ([place]) 09/06/2016  . Pneumothorax on right 09/07/2016  . Schizophrenia [facility])    Social History   Socioeconomic History  . Marital status: Divorced    Spouse name: Not on file  . Number of children: Not on file  . Years of education: Not on file  . Highest education level: Not on file  Occupational History  . Not on file  Social Needs  . Financial resource strain: Not on file  . Food insecurity:    Worry: Not on file  Inability: Not on file  . Transportation needs:    Medical: Not on file    Non-medical: Not on file  Tobacco Use  . Smoking status: Former Smoker    Packs/day: 0.50    Types: Cigarettes  . Smokeless tobacco: Never Used  Substance and Sexual Activity  . Alcohol use: No  . Drug use: No  . Sexual activity: Never  Lifestyle  . Physical activity:    Days per week: Not on file    Minutes per session: Not on file  . Stress: Not on file  Relationships  . Social connections:    Talks on phone: Not on file    Gets together: Not on file    Attends religious service: Not on file    Active member of club or organization: Not on file    Attends meetings of clubs or organizations: Not on file     Relationship status: Not on file  Other Topics Concern  . Not on file  Social History Narrative  . Not on file   History reviewed. No pertinent family history. Scheduled Meds: . acetylcysteine  2 mL Nebulization BID  . budesonide (PULMICORT) nebulizer solution  0.25 mg Nebulization BID  . cloNIDine  0.1 mg Oral TID  . enoxaparin (LOVENOX) injection  30 mg Subcutaneous Q24H  . haloperidol  5 mg Oral QHS  . hydrALAZINE  25 mg Oral Q8H  . insulin aspart  0-9 Units Subcutaneous TID WC  . ipratropium-albuterol  3 mL Nebulization BID  . minoxidil  5 mg Oral BID  . mirtazapine  30 mg Oral QHS  . pravastatin  20 mg Oral QHS   Continuous Infusions: . piperacillin-tazobactam (ZOSYN)  IV 3.375 g (09/02/17 0509)   PRN Meds:.acetaminophen **OR** acetaminophen, HYDROcodone-acetaminophen, ipratropium-albuterol, LORazepam, morphine injection, ondansetron **OR** ondansetron (ZOFRAN) IV, traZODone Medications Prior to Admission:  Prior to Admission medications   Medication Sig Start Date End Date Taking? Authorizing Provider  cloNIDine (CATAPRES) 0.1 MG tablet Take 0.1 mg by mouth 3 (three) times daily.     Yes [provider]  haloperidol (HALDOL) 5 MG tablet Take 5 mg by mouth at bedtime.  02/16/15  Yes [provider]  LORazepam (ATIVAN) 0.5 MG tablet Take 0.5 mg by mouth at bedtime as needed for sleep.  07/19/16  Yes [provider]  metFORMIN (GLUCOPHAGE) 500 MG tablet Take 500 mg by mouth daily. 06/19/17  Yes [provider]  minoxidil (LONITEN) 2.5 MG tablet Take 2 tablets (5 mg total) by mouth 2 (two) times daily. 03/17/15  Yes Donne Hazel, MD  mirtazapine (REMERON) 30 MG tablet Take 30 mg by mouth at bedtime.     Yes [provider]  pravastatin (PRAVACHOL) 20 MG tablet Take 20 mg by mouth at bedtime.    Yes [provider]  traZODone (DESYREL) 100 MG tablet Take 100 mg by mouth at bedtime as needed for sleep.    Yes [provider]     No Known Allergies Review of Systems  Unable to perform ROS: Dementia    Physical Exam  Constitutional: She appears ill.  Appears frail, makes and keeps eye contact. Cooperative, but unable to follow detailed directions  HENT:  Head: Atraumatic.  Some temporal wasting  Cardiovascular: Normal rate.  Pulmonary/Chest: Effort normal. No respiratory distress.  Abdominal: Soft. She exhibits no distension. There is no tenderness.  Musculoskeletal:       Right lower leg: She exhibits no edema.  Left lower leg: She exhibits no edema.  Neurological: She is alert.  Known dementia.   Skin: Skin is warm and dry.  Psychiatric: Her mood appears not anxious. She is not agitated.  Nursing note and vitals reviewed.   Vital Signs: BP (!) 217/96   Pulse 98   Temp 97.7 F (36.5 C) (Oral)   Resp 19   Ht 5\' 3"  (1.6 m)   Wt 98.7 kg (217 lb 9.6 oz)   SpO2 90%   BMI 38.55 kg/m  Pain Scale: 0-10   Pain Score: Asleep   SpO2: SpO2: 90 % O2 Device:SpO2: 90 % O2 Flow Rate: .O2 Flow Rate (L/min): 2 L/min  IO: Intake/output summary:   Intake/Output Summary (Last 24 hours) at 09/02/2017 0946 Last data filed at 09/02/2017 0500 Gross per 24 hour  Intake 360 ml  Output 1200 ml  Net -840 ml    LBM: Last BM Date: 09/01/17 Baseline Weight: Weight: 44 kg (97 lb) Most recent weight: Weight: 98.7 kg (217 lb 9.6 oz)     Palliative Assessment/Data:   Flowsheet Rows     Most Recent Value  Intake Tab  Referral Department  Hospitalist  Unit at Time of Referral  Cardiac/Telemetry Unit  Palliative Care Primary Diagnosis  Pulmonary  Date Notified  09/01/17  Palliative Care Type  New Palliative care  Reason for referral  Clarify Goals of Care  Date of Admission  08/29/17  Date first seen by Palliative Care  09/02/17  # of days Palliative referral response time  1 Day(s)  # of days IP prior to Palliative referral  3  Clinical Assessment  Palliative Performance Scale Score  30%  Pain Max  last 24 hours  Not able to report  Pain Min Last 24 hours  Not able to report  Dyspnea Max Last 24 Hours  Not able to report  Dyspnea Min Last 24 hours  Not able to report  Psychosocial & Spiritual Assessment  Palliative Care Outcomes  Patient/Family meeting held?  Yes  Patient/Family wishes: Interventions discontinued/not started   Mechanical Ventilation      Time In: 1135 Time Out: 1145 Time Total: 70 minutes Greater than 50%  of this time was spent counseling and coordinating care related to the above assessment and plan.  Signed by: Drue Novel, NP   Please contact Palliative Medicine Team phone at [phone] for questions and concerns.  For individual provider: See Shea Evans

## 2017-09-02 NOTE — Progress Notes (Signed)
  Speech Language Pathology Treatment: Dysphagia  Patient Details Name: Brittney Wall MRN: [id number] DOB: 02-08-1946 Today's Date: 09/02/2017 Time: 9470-7615 SLP Time Calculation (min) (ACUTE ONLY): 15 min  Assessment / Plan / Recommendation Clinical Impression  Pt recently finished dinner meal of D2 and thin liquids. She was fed by family members and reportedly tolerated well. Her daughter reports that Pt has "been awake all day and night" and they are hoping that she will rest. Family has been in discussion with palliative care medicine and are considering hospice. Current diet continues to be appropriate and Pt will need caregiver assist for meals due to cognitive deficits (impulsivity). SLP will plan to sign off at this time. Reconsult if needs arise.    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 She is noted to be more short of breath and weak according to her sister and her daughter.She was admitted with suspected aspiration pneumonia.Chest x-ray shows: Lungs are hyperinflated with underlying emphysematous changes. Patchy opacities at the inferior right upper and left lower lobes, which could reflect atelectasis or infiltrates. Small left pleural effusion. No pulmonary edema. No pneumothorax. BSE requested.      SLP Plan  All goals met;Discharge SLP treatment due to (comment)       Recommendations  Diet recommendations: Dysphagia 2 (fine chop);Thin liquid Medication Administration: Crushed with puree Supervision: Full supervision/cueing for compensatory strategies Compensations: Minimize environmental distractions;Slow rate;Small sips/bites;Lingual sweep for clearance of  pocketing Postural Changes and/or Swallow Maneuvers: Seated upright 90 degrees;Upright 30-60 min after meal                Oral Care Recommendations: Staff/trained caregiver to provide oral care;Oral care before and after PO Follow up Recommendations: None SLP Visit Diagnosis: Dysphagia, unspecified (R13.10) Plan: All goals met;Discharge SLP treatment due to (comment)       Thank you,  Genene Churn, [place]                 [place] 09/02/2017, 6:47 PM

## 2017-09-03 DIAGNOSIS — J189 Pneumonia, unspecified organism: Secondary | ICD-10-CM

## 2017-09-03 DIAGNOSIS — E872 Acidosis: Secondary | ICD-10-CM

## 2017-09-03 DIAGNOSIS — F209 Schizophrenia, unspecified: Secondary | ICD-10-CM

## 2017-09-03 DIAGNOSIS — F0281 Dementia in other diseases classified elsewhere with behavioral disturbance: Secondary | ICD-10-CM

## 2017-09-03 DIAGNOSIS — I10 Essential (primary) hypertension: Secondary | ICD-10-CM

## 2017-09-03 DIAGNOSIS — J449 Chronic obstructive pulmonary disease, unspecified: Secondary | ICD-10-CM

## 2017-09-03 DIAGNOSIS — E43 Unspecified severe protein-calorie malnutrition: Secondary | ICD-10-CM

## 2017-09-03 DIAGNOSIS — C787 Secondary malignant neoplasm of liver and intrahepatic bile duct: Secondary | ICD-10-CM

## 2017-09-03 DIAGNOSIS — E876 Hypokalemia: Secondary | ICD-10-CM

## 2017-09-03 DIAGNOSIS — C7800 Secondary malignant neoplasm of unspecified lung: Secondary | ICD-10-CM

## 2017-09-03 LAB — CULTURE, BLOOD (ROUTINE X 2)
CULTURE: NO GROWTH
Culture: NO GROWTH
SPECIAL REQUESTS: ADEQUATE
SPECIAL REQUESTS: ADEQUATE

## 2017-09-03 LAB — GLUCOSE, CAPILLARY
GLUCOSE-CAPILLARY: 170 mg/dL — AB (ref 70–99)
Glucose-Capillary: 110 mg/dL — ABNORMAL HIGH (ref 70–99)
Glucose-Capillary: 135 mg/dL — ABNORMAL HIGH (ref 70–99)

## 2017-09-03 MED ORDER — ACETAMINOPHEN 325 MG PO TABS: 650.0000 mg | ORAL_TABLET | Freq: Four times a day (QID) | ORAL | 0 refills | Status: AC | PRN

## 2017-09-03 MED ORDER — AMOXICILLIN-POT CLAVULANATE 875-125 MG PO TABS: 1.0000 | ORAL_TABLET | Freq: Two times a day (BID) | ORAL | 0 refills | Status: AC

## 2017-09-03 MED ORDER — IPRATROPIUM-ALBUTEROL 0.5-2.5 (3) MG/3ML IN SOLN: 3.0000 mL | Freq: Four times a day (QID) | RESPIRATORY_TRACT | 1 refills | Status: AC | PRN

## 2017-09-03 MED ORDER — MORPHINE SULFATE 20 MG/5ML PO SOLN: 5.0000 mg | ORAL | 0 refills | Status: AC | PRN

## 2017-09-03 MED ORDER — BUDESONIDE 0.25 MG/2ML IN SUSP: 0.2500 mg | Freq: Two times a day (BID) | RESPIRATORY_TRACT | 12 refills | Status: AC

## 2017-09-03 MED ORDER — HYDRALAZINE HCL 50 MG PO TABS: 50.0000 mg | ORAL_TABLET | Freq: Three times a day (TID) | ORAL | 0 refills | Status: AC

## 2017-09-03 NOTE — Progress Notes (Signed)
Discharge instructions given to daughter.  Daughter verbalized understanding of instructions. Patient discharged home with family.

## 2017-09-03 NOTE — Care Management (Signed)
Patient Information   SS# [id number]  Patient Name Jerre, Diguglielmo ([id number]) Sex Female DOB 1945-10-21  Room Bed  A341 A341-01  Patient Demographics   Address [place] [address] Phone [phone] (Home)  Patient Ethnicity & Race   Ethnic Group Patient Race  Not Hispanic or Latino Black or African American  Emergency Contact(s)   Name Relation Home Work Mobile  Campanella,Erika Daughter [phone]    [place] Niece [phone]    Documents on File    Status Date Received Description  Documents for the Patient  EMR Patient Summary Not Received    [place] - Scanned Received 11/28/08   [facility] Release of Information Not Received    [facility] Release of Information Not Received    [place] E-Signature HIPAA Notice of Privacy Received 05/03/13   [facility] E-Signature HIPAA Notice of Privacy Spanish Not Received    Driver's License Not Received    Advance Directives/Living Will/HCPOA/POA Not Received    Insurance Card Not Received    Financial Application Not Received    Insurance Card  05/03/13   Other Photo ID Not Received    CE Attachment     CE Attachment     AMB Intake Forms/Questionnaires  08/12/16   AMB Correspondence  06/17/16 [facility]  AMB Correspondence  08/12/16 CURRENT MENDICATION [facility]  AMB Correspondence  08/12/16 DIABETES EDUCATION PATIENT LETTER [place] NUTRITION&DIA  [place] E-Signature HIPAA Notice of Privacy Signed 09/05/16   Insurance Card Received 12/23/16 New Medicare card 2018  Release of Information Not Received    Advanced Beneficiary Notice (ABN) Not Received    E-Signature AOB [place] Not Received    [place] E-Signature HIPAA Notice of [place] E-Signature HIPAA Notice of Privacy Signed 07/17/17   Advance Directives/Living Will/HCPOA/POA Received 07/25/17   Documents for the Encounter  AOB (Assignment of Insurance Benefits) Not Received     E-signature AOB Signed 08/29/17   MEDICARE RIGHTS Not Received    E-signature Medicare Rights Signed 08/29/17   ED Patient Billing Extract   ED PB Billing Extract  Cardiac Monitoring Strip Received 08/30/17   Cardiac Monitoring Strip Shift Summary Received 08/30/17   EKG Received 09/01/17   Admission Information   Attending Provider Admitting Provider Admission Type Admission Date/Time  Barton Dubois, MD Rodena Goldmann, DO Emergency 08/29/17 2037  Discharge Date Hospital Service Auth/Cert Status Service Area   Internal Medicine Incomplete [facility]  Unit Room/Bed Admission Status   AP-DEPT 300 A341/A341-01 Admission (Confirmed)   Admission   Complaint  .  Hospital Account   Name Acct ID Class Status Primary Coverage  [place], [place] [id number] Inpatient Open MEDICARE - MEDICARE PART A AND B      Guarantor Account (for Hospital Account [id number])   Name Relation to Pt Service Area Active? Acct Type  Viviana Simpler Self CHSA Yes Personal/Family  Address Phone    [address] [place], [place] [address] [phone])        Coverage Information (for Hospital Account [id number])   F/O Payor/Plan Precert #  MEDICARE/MEDICARE PART A AND B   Subscriber Subscriber #  [place], [place] [id number]  Address Phone  [address] [place], [place] [address]

## 2017-09-03 NOTE — Discharge Summary (Signed)
Physician Discharge Summary  Brittney Wall IRW:[id number] DOB: 05-25-1945 DOA: 08/29/2017  PCP: [name], MD  Admit date: 08/29/2017 Discharge date: 09/03/2017  Time spent: 35 minutes  Recommendations for Outpatient Follow-up:  No further re-hospitalization. Focusing on comfort and symptomatic management. Hospice at home arranged. Comfort feeding. Patient is DNR/DNI.  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 Discharge home with home hospice.  Diet recommendation: Comfort feeding.  Filed Weights   08/29/17 2039 09/02/17 0523  Weight: 44 kg (97 lb) 98.7 kg (217 lb 9.6 oz)    History of present illness:  As per H&P written by Dr. Manuella Ghazi on 08/30/17 72 y.o. 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  She is noted to be more short of breath and weak according to her sister and her daughter.  Patient cannot give any significant history.  There is some question of whether or not she has been hypoxemic at home, but this is uncertain.  She is also noted to have some mild diarrhea and had stopped walking today due to her weakness. She has been noted to have worsening weight loss over the last several months, but appears to have  good oral intake per the daughter at the bedside.  No cough or sputum production has been noted.   ED Course: Vital signs are stable with tachycardia noted.  Laboratory data with leukocytosis of 13,500, sodium 134, potassium 3.1, glucose 189, and lactic acid trending up to 3 from 2.52.  Two-view chest x-ray with right upper and left lower lobe opacities noted with some mild cardiomegaly and emphysema.  CT chest with contrast reviewed from last month with some obstructive/aspiration findings to the left lower lobe along with mass to right upper lobe.  Additionally, there is noted liver and adrenal mass.   Hospital Course:  1-generalized weakness with postobstructive/aspiration pneumonia -Patient is afebrile and currently no requiring any oxygen supplementation or complaining of any shortness of breath. -Intermittent dry coughing spells appreciated. -Will complete antibiotic therapy using Augmentin. -Continue as needed nebulization treatment (pulmicort and Duoneb) -plan is to focus more on symptoms management and comfort. -aspiration precaution discussed with family.  2-metastatic disease of unknown primary -Oncology consult appreciated. -Given presentation of metastatic disease with failure to thrive, advanced dementia and poor overall performance status; oncology recommends against doing a biopsy of her liver seen the patient is not a candidate for any active therapy even if we have a diagnosis of malignancy. -At this moment the recommendations given to pursued palliative care and after having goals of care discussion the decision has been made to keep patient comfortable with home hospice. -Arrangement has been made to have home hospice at home and equipment will be delivered to provide care by patient's family members with hospice assistance.  3-Uncontrolled hypertension -continue clonidine and adjusted dose  of hydralazine   4-COPD -continue PRN nebulizer -no wheezing  appreciated.  5-hypokalemia -electrolytes repleted -no further blood work anticipated  6-dyslipidemia -statin discontinued in the setting of symptomatic management only.  7-bipolar disorder/schizophrenia -continue remeron, haldol and PRN ativan  8-hx of type 2 diabetes  -will stop metformin  -main goal is comfort care  9-severe protein calorie malnutrition -At this time plan is for comfort feeding.  Procedures:  See below for x-ray reports  Consultations: Palliative care  Discharge Exam: Vitals:   09/03/17 1300 09/03/17 1516  BP: (!) 151/62 135/71  Pulse: 98 94  Resp: 18 17  Temp:  98 F (36.7 C)  SpO2: 96% 97%    General: Frail, elderly, cachetic and in no distress. With good O2 sat on RA. denies CP or SOB currently. Cardiovascular: S1 and S2, no rubs, no gallops; positive SEM on exam. Respiratory: positive rhonchi, no wheezing, improved air movement. Abd: soft, NT, ND, positive BS Extremities: no edema, no cyanosis, no clubbing.  Discharge Instructions   Discharge Instructions    DME Nebulizer machine   Complete by:  As directed    Patient needs a nebulizer to treat with the following condition:  COPD (chronic obstructive pulmonary disease) ([place])   Discharge instructions   Complete by:  As directed    With intentions to transition care into just symptomatic management and hospice. Patient will complete 4 more days of antibiotics and will continue using nebulizers as needed. Morphine sulfate solution has been prescribed to assist with pain. Plan is for no further hospitalization and to keep patient comfortable as main goal.     Allergies as of 09/03/2017   No Known Allergies     Medication List    STOP taking these medications   metFORMIN 500 MG tablet Commonly known as:  GLUCOPHAGE   minoxidil 2.5 MG tablet Commonly known as:  LONITEN   pravastatin 20 MG tablet Commonly known as:  PRAVACHOL     TAKE these medications   acetaminophen 325 MG  tablet Commonly known as:  TYLENOL Take 2 tablets (650 mg total) by mouth every 6 (six) hours as needed for mild pain or headache (or Fever >/= 101).   amoxicillin-clavulanate 875-125 MG tablet Commonly known as:  AUGMENTIN Take 1 tablet by mouth every 12 (twelve) hours for 4 days.   budesonide 0.25 MG/2ML nebulizer solution Commonly known as:  PULMICORT Take 2 mLs (0.25 mg total) by nebulization 2 (two) times daily.   cloNIDine 0.1 MG tablet Commonly known as:  CATAPRES Take 0.1 mg by mouth 3 (three) times daily.   haloperidol 5 MG tablet Commonly known as:  HALDOL Take 5 mg by mouth at bedtime.   hydrALAZINE 50 MG tablet Commonly known as:  APRESOLINE Take 1 tablet (50 mg total) by mouth every 8 (eight) hours. What changed:    medication strength  how much to take  when to take this   ipratropium-albuterol 0.5-2.5 (3) MG/3ML Soln Commonly known as:  DUONEB Take 3 mLs by nebulization every 6 (six) hours as needed.   LORazepam 0.5 MG tablet Commonly known as:  ATIVAN Take 0.5 mg by mouth at bedtime as needed for sleep.   mirtazapine 30 MG tablet Commonly known as:  REMERON Take 30 mg by mouth at bedtime.   morphine 20 MG/5ML solution Take 1.3 mLs (5.2 mg total) by mouth every 4 (four) hours as needed for pain.   traZODone 100 MG tablet Commonly known as:  DESYREL Take 100 mg  by mouth at bedtime as needed for sleep.            Durable Medical Equipment  (From admission, onward)        Start     Ordered   09/03/17 0000  DME Nebulizer machine    Question:  Patient needs a nebulizer to treat with the following condition  Answer:  COPD (chronic obstructive pulmonary disease) ([place])   09/03/17 1711     No Known Allergies Follow-up Information    [name], MD. Schedule an appointment as soon as possible for a visit in 2 week(s).   Specialty:  Family Medicine Contact information: [address] [place] [address] [phone]             The results of significant diagnostics from this hospitalization (including imaging, microbiology, ancillary and laboratory) are listed below for reference.    Significant Diagnostic Studies: Dg Chest 2 View  Result Date: 08/29/2017 CLINICAL DATA:  Initial evaluation for acute shortness of breath. Cough. EXAM: CHEST - 2 VIEW COMPARISON:  Prior CT from 07/25/2014. FINDINGS: Cardiomegaly, stable from previous. Mediastinal silhouette within normal limits. Aortic atherosclerosis. Lungs are hyperinflated with underlying emphysematous changes. Patchy opacities at the inferior right upper and left lower lobes, which could reflect atelectasis or infiltrates. Small left pleural effusion. No pulmonary edema. No pneumothorax. No acute osseus abnormality. IMPRESSION: 1. Patchy multifocal opacities involving the right upper and left lower lobes, which could reflect atelectasis or infiltrates. 2. Small left pleural effusion. 3. Underlying emphysema. 4. Cardiomegaly with aortic atherosclerosis. Electronically Signed   By: Jeannine Boga M.D.   On: 08/29/2017 21:34    Microbiology: Recent Results (from the past 240 hour(s))  Blood Culture (routine x 2)     Status: None   Collection Time: 08/29/17 10:21 PM  Result Value Ref Range Status   Specimen Description LEFT ANTECUBITAL  Final   Special Requests   Final    BOTTLES DRAWN AEROBIC ONLY Blood Culture adequate volume   Culture   Final    NO GROWTH 5 DAYS Performed at [facility], [address], [place], [place] [address]    Report Status 09/03/2017 FINAL  Final  Blood Culture (routine x 2)     Status: None   Collection Time: 08/29/17 11:00 PM  Result Value Ref Range Status   Specimen Description BLOOD LEFT ARM  Final   Special Requests   Final    BOTTLES DRAWN AEROBIC ONLY Blood Culture adequate volume   Culture   Final    NO GROWTH 5 DAYS Performed at [facility], [address] [place], [place] [address]    Report Status 09/03/2017  FINAL  Final     Labs: Basic Metabolic Panel: Recent Labs  Lab 08/29/17 2220 08/30/17 0659 08/31/17 0600 09/01/17 0606  NA 134*  --  141 140  K 3.1*  --  3.6 3.9  CL 99  --  110 107  CO2 25  --  26 26  GLUCOSE 189*  --  129* 109*  BUN 38*  --  24* 17  CREATININE 0.85  --  0.65 0.57  CALCIUM 9.2  --  8.4* 8.4*  MG  --  2.0  --   --    Liver Function Tests: Recent Labs  Lab 08/29/17 2220  AST 53*  ALT 23  ALKPHOS 80  BILITOT 1.2  PROT 8.2*  ALBUMIN 3.8   No results for input(s): LIPASE, AMYLASE in the last  168 hours. No results for input(s): AMMONIA in the last 168 hours. CBC: Recent Labs  Lab 08/29/17 2220 08/31/17 0600 09/01/17 0606  WBC 13.5* 5.6 6.8  NEUTROABS 12.6*  --   --   HGB 14.0 10.2* 11.3*  HCT 41.3 31.4* 35.1*  MCV 89.0 89.0 89.1  PLT 218 206 235   Cardiac Enzymes: No results for input(s): CKTOTAL, CKMB, CKMBINDEX, TROPONINI in the last 168 hours. BNP: BNP (last 3 results) No results for input(s): BNP in the last 8760 hours.  ProBNP (last 3 results) No results for input(s): PROBNP in the last 8760 hours.  CBG: Recent Labs  Lab 09/02/17 1713 09/02/17 2226 09/03/17 0818 09/03/17 1139 09/03/17 1700  GLUCAP 167* 126* 110* 135* 170*       Signed:  Barton Dubois MD.  [facility] 09/03/2017, 5:18 PM

## 2017-09-03 NOTE — Care Management Note (Signed)
Case Management Note  Patient Details  Name: Brittney Wall MRN: [id number] Date of Birth: 03-26-1945  Subjective/Objective:                 Admitted with pneumonia. Pt from home with dementia, lives with daughter who is her caregiver. Pt has 24/7 care. Pt originally recommended for SNF, family declines and wants to take pt home. Palliative care has met with family.    Action/Plan: Plan for DC home today with referral to Hospice. Family has chosen [facility] from list of providers. Pt will need hospital bed delivered to home prior to DC. CM has routed referral and called to verify receipt, left VM for Cassandra. Pt will DC via EMS once bed delivered.   Expected Discharge Date:  09/02/17               Expected Discharge Plan:  Home w Hospice Care  In-House Referral:  Clinical Social Work, Hospice / Palliative Care  Discharge planning Services  CM Consult  Post Acute Care Choice:  Hospice Choice offered to:  Adult Children  HH Arranged:  RN [place] Agency:  [facility]  Status of Service:  Completed, signed off  If discussed at [employer], dates discussed:    Additional Comments:  Sherald Barge, RN 09/03/2017, 11:30 AM

## 2017-09-03 NOTE — Care Management Important Message (Signed)
Important Message  Patient Details  Name: Brittney Wall MRN: [id number] Date of Birth: [birth year]   Medicare Important Message Given:  Yes    Shelda Altes 09/03/2017, 1:23 PM

## 2017-09-03 NOTE — Progress Notes (Signed)
Palliative: Brittney Wall is lying quietly in bed.  She does not make eye contact this morning.  She appears weak and frail, but family at bedside share that she has been about this weight for the last 8 to 12 months.  Present today at bedside is niece, Brittney Wall and daughter Brittney Wall. I asked what Brittney Wall knows about her mother's illness, she is able to verbalize stage IV cancer, we also talked about aspiration pneumonia versus postobstructive pneumonia.  I share that it is anticipated that Brittney Wall will continue to experience pneumonia, and further declines. We talked about preferred place of death.  Brittney Wall states that Brittney Wall and their family desire for Brittney Wall to pass in her own home.  We talked about the benefits of hospice and allowing this to happen.  Also talked about the benefits of treating the treatable at home, my worry that when Brittney Wall become sick again, if she comes to the hospital, she may pass away here in the hospital. We talked about symptom management.  We talked about on burdening Brittney Wall from treatments that are not changing what is happening, that are uncomfortable.  Brittney Wall asks about doctor's appointments, she shares that it is difficult for Brittney Wall to get out to the see her physicians.  I encouraged Brittney Wall to discuss these needs with the hospice provider, they may be able to intervene, have medications prescribed without the necessity of an in person visit. We talked about provider choice for hospice.  Family elects [facility] for treat the treatable in-home hospice services.  At this point, they endorse do not rehospitalize.  They do need a hospital bed, and family discusses where they will place the hospital bed in the home.   We talked about how to make choices for loved ones including 1) keeping them at the center of decision-making 2) are we doing something for them or to them, 3) what would the person Brittney Wall was 10 years ago  say about her current condition, what choices with that person make. Discussion with case management related to hospice referral, equipment needs, transportation. DNR/[place]enrod form completed. 80 minutes, extended time [name], NP Palliative Medicine Team Team Phone # [phone]  Greater than 50% of this time was spent counseling and coordinating care related to the above assessment and plan

## 2017-09-03 NOTE — Progress Notes (Signed)
Was the fall witnessed: yes  Patient condition before and after the fall: stable  Patient's reaction to the fall: none, No c/o of pain or discomfort noted.   Name of the doctor that was notified including date and time: 09/03/17 1340 Any interventions and vital signs: none  BP-151/62, P-98, R-18, O2 Sat- 96%.

## 2017-09-04 DIAGNOSIS — I10 Essential (primary) hypertension: Secondary | ICD-10-CM | POA: Diagnosis not present

## 2017-09-04 DIAGNOSIS — E118 Type 2 diabetes mellitus with unspecified complications: Secondary | ICD-10-CM | POA: Diagnosis not present

## 2017-09-04 DIAGNOSIS — F319 Bipolar disorder, unspecified: Secondary | ICD-10-CM | POA: Diagnosis not present

## 2017-09-04 DIAGNOSIS — F209 Schizophrenia, unspecified: Secondary | ICD-10-CM | POA: Diagnosis not present

## 2017-09-04 DIAGNOSIS — R531 Weakness: Secondary | ICD-10-CM | POA: Diagnosis not present

## 2017-09-04 DIAGNOSIS — R0602 Shortness of breath: Secondary | ICD-10-CM | POA: Diagnosis not present

## 2017-09-04 DIAGNOSIS — E43 Unspecified severe protein-calorie malnutrition: Secondary | ICD-10-CM | POA: Diagnosis not present

## 2017-09-04 DIAGNOSIS — J449 Chronic obstructive pulmonary disease, unspecified: Secondary | ICD-10-CM | POA: Diagnosis not present

## 2017-09-05 DIAGNOSIS — I10 Essential (primary) hypertension: Secondary | ICD-10-CM | POA: Diagnosis not present

## 2017-09-05 DIAGNOSIS — F319 Bipolar disorder, unspecified: Secondary | ICD-10-CM | POA: Diagnosis not present

## 2017-09-05 DIAGNOSIS — F209 Schizophrenia, unspecified: Secondary | ICD-10-CM | POA: Diagnosis not present

## 2017-09-05 DIAGNOSIS — J449 Chronic obstructive pulmonary disease, unspecified: Secondary | ICD-10-CM | POA: Diagnosis not present

## 2017-09-05 DIAGNOSIS — E43 Unspecified severe protein-calorie malnutrition: Secondary | ICD-10-CM | POA: Diagnosis not present

## 2017-09-05 DIAGNOSIS — E118 Type 2 diabetes mellitus with unspecified complications: Secondary | ICD-10-CM | POA: Diagnosis not present

## 2017-09-08 DIAGNOSIS — F209 Schizophrenia, unspecified: Secondary | ICD-10-CM | POA: Diagnosis not present

## 2017-09-08 DIAGNOSIS — I10 Essential (primary) hypertension: Secondary | ICD-10-CM | POA: Diagnosis not present

## 2017-09-08 DIAGNOSIS — J449 Chronic obstructive pulmonary disease, unspecified: Secondary | ICD-10-CM | POA: Diagnosis not present

## 2017-09-08 DIAGNOSIS — F319 Bipolar disorder, unspecified: Secondary | ICD-10-CM | POA: Diagnosis not present

## 2017-09-08 DIAGNOSIS — E43 Unspecified severe protein-calorie malnutrition: Secondary | ICD-10-CM | POA: Diagnosis not present

## 2017-09-08 DIAGNOSIS — E118 Type 2 diabetes mellitus with unspecified complications: Secondary | ICD-10-CM | POA: Diagnosis not present

## 2017-09-10 DIAGNOSIS — E118 Type 2 diabetes mellitus with unspecified complications: Secondary | ICD-10-CM | POA: Diagnosis not present

## 2017-09-10 DIAGNOSIS — F209 Schizophrenia, unspecified: Secondary | ICD-10-CM | POA: Diagnosis not present

## 2017-09-10 DIAGNOSIS — J189 Pneumonia, unspecified organism: Secondary | ICD-10-CM | POA: Diagnosis not present

## 2017-09-10 DIAGNOSIS — J449 Chronic obstructive pulmonary disease, unspecified: Secondary | ICD-10-CM | POA: Diagnosis not present

## 2017-09-10 DIAGNOSIS — R634 Abnormal weight loss: Secondary | ICD-10-CM | POA: Diagnosis not present

## 2017-09-11 DIAGNOSIS — E118 Type 2 diabetes mellitus with unspecified complications: Secondary | ICD-10-CM | POA: Diagnosis not present

## 2017-09-11 DIAGNOSIS — J449 Chronic obstructive pulmonary disease, unspecified: Secondary | ICD-10-CM | POA: Diagnosis not present

## 2017-09-11 DIAGNOSIS — F319 Bipolar disorder, unspecified: Secondary | ICD-10-CM | POA: Diagnosis not present

## 2017-09-11 DIAGNOSIS — I10 Essential (primary) hypertension: Secondary | ICD-10-CM | POA: Diagnosis not present

## 2017-09-11 DIAGNOSIS — R531 Weakness: Secondary | ICD-10-CM | POA: Diagnosis not present

## 2017-09-11 DIAGNOSIS — E43 Unspecified severe protein-calorie malnutrition: Secondary | ICD-10-CM | POA: Diagnosis not present

## 2017-09-11 DIAGNOSIS — F209 Schizophrenia, unspecified: Secondary | ICD-10-CM | POA: Diagnosis not present

## 2017-09-11 DIAGNOSIS — R0602 Shortness of breath: Secondary | ICD-10-CM | POA: Diagnosis not present

## 2017-09-18 DIAGNOSIS — E43 Unspecified severe protein-calorie malnutrition: Secondary | ICD-10-CM | POA: Diagnosis not present

## 2017-09-18 DIAGNOSIS — F319 Bipolar disorder, unspecified: Secondary | ICD-10-CM | POA: Diagnosis not present

## 2017-09-18 DIAGNOSIS — F209 Schizophrenia, unspecified: Secondary | ICD-10-CM | POA: Diagnosis not present

## 2017-09-18 DIAGNOSIS — I10 Essential (primary) hypertension: Secondary | ICD-10-CM | POA: Diagnosis not present

## 2017-09-18 DIAGNOSIS — J449 Chronic obstructive pulmonary disease, unspecified: Secondary | ICD-10-CM | POA: Diagnosis not present

## 2017-09-18 DIAGNOSIS — E118 Type 2 diabetes mellitus with unspecified complications: Secondary | ICD-10-CM | POA: Diagnosis not present

## 2017-09-19 DIAGNOSIS — F319 Bipolar disorder, unspecified: Secondary | ICD-10-CM | POA: Diagnosis not present

## 2017-09-19 DIAGNOSIS — F209 Schizophrenia, unspecified: Secondary | ICD-10-CM | POA: Diagnosis not present

## 2017-09-19 DIAGNOSIS — E118 Type 2 diabetes mellitus with unspecified complications: Secondary | ICD-10-CM | POA: Diagnosis not present

## 2017-09-19 DIAGNOSIS — E43 Unspecified severe protein-calorie malnutrition: Secondary | ICD-10-CM | POA: Diagnosis not present

## 2017-09-19 DIAGNOSIS — J449 Chronic obstructive pulmonary disease, unspecified: Secondary | ICD-10-CM | POA: Diagnosis not present

## 2017-09-19 DIAGNOSIS — I10 Essential (primary) hypertension: Secondary | ICD-10-CM | POA: Diagnosis not present

## 2017-09-26 DIAGNOSIS — J449 Chronic obstructive pulmonary disease, unspecified: Secondary | ICD-10-CM | POA: Diagnosis not present

## 2017-09-26 DIAGNOSIS — E118 Type 2 diabetes mellitus with unspecified complications: Secondary | ICD-10-CM | POA: Diagnosis not present

## 2017-09-26 DIAGNOSIS — F319 Bipolar disorder, unspecified: Secondary | ICD-10-CM | POA: Diagnosis not present

## 2017-09-26 DIAGNOSIS — I10 Essential (primary) hypertension: Secondary | ICD-10-CM | POA: Diagnosis not present

## 2017-09-26 DIAGNOSIS — F209 Schizophrenia, unspecified: Secondary | ICD-10-CM | POA: Diagnosis not present

## 2017-09-26 DIAGNOSIS — E43 Unspecified severe protein-calorie malnutrition: Secondary | ICD-10-CM | POA: Diagnosis not present

## 2017-10-02 DIAGNOSIS — E118 Type 2 diabetes mellitus with unspecified complications: Secondary | ICD-10-CM | POA: Diagnosis not present

## 2017-10-02 DIAGNOSIS — I10 Essential (primary) hypertension: Secondary | ICD-10-CM | POA: Diagnosis not present

## 2017-10-02 DIAGNOSIS — F319 Bipolar disorder, unspecified: Secondary | ICD-10-CM | POA: Diagnosis not present

## 2017-10-02 DIAGNOSIS — J449 Chronic obstructive pulmonary disease, unspecified: Secondary | ICD-10-CM | POA: Diagnosis not present

## 2017-10-02 DIAGNOSIS — F209 Schizophrenia, unspecified: Secondary | ICD-10-CM | POA: Diagnosis not present

## 2017-10-02 DIAGNOSIS — E43 Unspecified severe protein-calorie malnutrition: Secondary | ICD-10-CM | POA: Diagnosis not present

## 2017-10-03 DIAGNOSIS — F209 Schizophrenia, unspecified: Secondary | ICD-10-CM | POA: Diagnosis not present

## 2017-10-03 DIAGNOSIS — E43 Unspecified severe protein-calorie malnutrition: Secondary | ICD-10-CM | POA: Diagnosis not present

## 2017-10-03 DIAGNOSIS — E118 Type 2 diabetes mellitus with unspecified complications: Secondary | ICD-10-CM | POA: Diagnosis not present

## 2017-10-03 DIAGNOSIS — I10 Essential (primary) hypertension: Secondary | ICD-10-CM | POA: Diagnosis not present

## 2017-10-03 DIAGNOSIS — J449 Chronic obstructive pulmonary disease, unspecified: Secondary | ICD-10-CM | POA: Diagnosis not present

## 2017-10-03 DIAGNOSIS — F319 Bipolar disorder, unspecified: Secondary | ICD-10-CM | POA: Diagnosis not present

## 2017-10-07 DIAGNOSIS — E43 Unspecified severe protein-calorie malnutrition: Secondary | ICD-10-CM | POA: Diagnosis not present

## 2017-10-07 DIAGNOSIS — I10 Essential (primary) hypertension: Secondary | ICD-10-CM | POA: Diagnosis not present

## 2017-10-07 DIAGNOSIS — E118 Type 2 diabetes mellitus with unspecified complications: Secondary | ICD-10-CM | POA: Diagnosis not present

## 2017-10-07 DIAGNOSIS — J449 Chronic obstructive pulmonary disease, unspecified: Secondary | ICD-10-CM | POA: Diagnosis not present

## 2017-10-07 DIAGNOSIS — F209 Schizophrenia, unspecified: Secondary | ICD-10-CM | POA: Diagnosis not present

## 2017-10-07 DIAGNOSIS — F319 Bipolar disorder, unspecified: Secondary | ICD-10-CM | POA: Diagnosis not present

## 2017-10-09 DIAGNOSIS — J449 Chronic obstructive pulmonary disease, unspecified: Secondary | ICD-10-CM | POA: Diagnosis not present

## 2017-10-09 DIAGNOSIS — E43 Unspecified severe protein-calorie malnutrition: Secondary | ICD-10-CM | POA: Diagnosis not present

## 2017-10-09 DIAGNOSIS — I10 Essential (primary) hypertension: Secondary | ICD-10-CM | POA: Diagnosis not present

## 2017-10-09 DIAGNOSIS — F209 Schizophrenia, unspecified: Secondary | ICD-10-CM | POA: Diagnosis not present

## 2017-10-09 DIAGNOSIS — E118 Type 2 diabetes mellitus with unspecified complications: Secondary | ICD-10-CM | POA: Diagnosis not present

## 2017-10-09 DIAGNOSIS — F319 Bipolar disorder, unspecified: Secondary | ICD-10-CM | POA: Diagnosis not present

## 2017-10-10 DIAGNOSIS — E43 Unspecified severe protein-calorie malnutrition: Secondary | ICD-10-CM | POA: Diagnosis not present

## 2017-10-10 DIAGNOSIS — I10 Essential (primary) hypertension: Secondary | ICD-10-CM | POA: Diagnosis not present

## 2017-10-10 DIAGNOSIS — F319 Bipolar disorder, unspecified: Secondary | ICD-10-CM | POA: Diagnosis not present

## 2017-10-10 DIAGNOSIS — J449 Chronic obstructive pulmonary disease, unspecified: Secondary | ICD-10-CM | POA: Diagnosis not present

## 2017-10-10 DIAGNOSIS — E118 Type 2 diabetes mellitus with unspecified complications: Secondary | ICD-10-CM | POA: Diagnosis not present

## 2017-10-10 DIAGNOSIS — F209 Schizophrenia, unspecified: Secondary | ICD-10-CM | POA: Diagnosis not present

## 2017-10-12 DIAGNOSIS — J449 Chronic obstructive pulmonary disease, unspecified: Secondary | ICD-10-CM | POA: Diagnosis not present

## 2017-10-12 DIAGNOSIS — E118 Type 2 diabetes mellitus with unspecified complications: Secondary | ICD-10-CM | POA: Diagnosis not present

## 2017-10-12 DIAGNOSIS — F209 Schizophrenia, unspecified: Secondary | ICD-10-CM | POA: Diagnosis not present

## 2017-10-12 DIAGNOSIS — R531 Weakness: Secondary | ICD-10-CM | POA: Diagnosis not present

## 2017-10-12 DIAGNOSIS — R0602 Shortness of breath: Secondary | ICD-10-CM | POA: Diagnosis not present

## 2017-10-12 DIAGNOSIS — I10 Essential (primary) hypertension: Secondary | ICD-10-CM | POA: Diagnosis not present

## 2017-10-12 DIAGNOSIS — E43 Unspecified severe protein-calorie malnutrition: Secondary | ICD-10-CM | POA: Diagnosis not present

## 2017-10-12 DIAGNOSIS — F319 Bipolar disorder, unspecified: Secondary | ICD-10-CM | POA: Diagnosis not present

## 2017-10-17 DIAGNOSIS — E43 Unspecified severe protein-calorie malnutrition: Secondary | ICD-10-CM | POA: Diagnosis not present

## 2017-10-17 DIAGNOSIS — E118 Type 2 diabetes mellitus with unspecified complications: Secondary | ICD-10-CM | POA: Diagnosis not present

## 2017-10-17 DIAGNOSIS — J449 Chronic obstructive pulmonary disease, unspecified: Secondary | ICD-10-CM | POA: Diagnosis not present

## 2017-10-17 DIAGNOSIS — F319 Bipolar disorder, unspecified: Secondary | ICD-10-CM | POA: Diagnosis not present

## 2017-10-17 DIAGNOSIS — F209 Schizophrenia, unspecified: Secondary | ICD-10-CM | POA: Diagnosis not present

## 2017-10-17 DIAGNOSIS — I10 Essential (primary) hypertension: Secondary | ICD-10-CM | POA: Diagnosis not present

## 2017-10-22 DIAGNOSIS — E43 Unspecified severe protein-calorie malnutrition: Secondary | ICD-10-CM | POA: Diagnosis not present

## 2017-10-22 DIAGNOSIS — I10 Essential (primary) hypertension: Secondary | ICD-10-CM | POA: Diagnosis not present

## 2017-10-22 DIAGNOSIS — F209 Schizophrenia, unspecified: Secondary | ICD-10-CM | POA: Diagnosis not present

## 2017-10-22 DIAGNOSIS — F319 Bipolar disorder, unspecified: Secondary | ICD-10-CM | POA: Diagnosis not present

## 2017-10-22 DIAGNOSIS — E118 Type 2 diabetes mellitus with unspecified complications: Secondary | ICD-10-CM | POA: Diagnosis not present

## 2017-10-22 DIAGNOSIS — J449 Chronic obstructive pulmonary disease, unspecified: Secondary | ICD-10-CM | POA: Diagnosis not present

## 2017-10-28 DIAGNOSIS — E118 Type 2 diabetes mellitus with unspecified complications: Secondary | ICD-10-CM | POA: Diagnosis not present

## 2017-10-28 DIAGNOSIS — F209 Schizophrenia, unspecified: Secondary | ICD-10-CM | POA: Diagnosis not present

## 2017-10-28 DIAGNOSIS — F319 Bipolar disorder, unspecified: Secondary | ICD-10-CM | POA: Diagnosis not present

## 2017-10-28 DIAGNOSIS — J449 Chronic obstructive pulmonary disease, unspecified: Secondary | ICD-10-CM | POA: Diagnosis not present

## 2017-10-28 DIAGNOSIS — E43 Unspecified severe protein-calorie malnutrition: Secondary | ICD-10-CM | POA: Diagnosis not present

## 2017-10-28 DIAGNOSIS — I10 Essential (primary) hypertension: Secondary | ICD-10-CM | POA: Diagnosis not present

## 2017-10-29 DIAGNOSIS — E118 Type 2 diabetes mellitus with unspecified complications: Secondary | ICD-10-CM | POA: Diagnosis not present

## 2017-10-29 DIAGNOSIS — I10 Essential (primary) hypertension: Secondary | ICD-10-CM | POA: Diagnosis not present

## 2017-10-29 DIAGNOSIS — J449 Chronic obstructive pulmonary disease, unspecified: Secondary | ICD-10-CM | POA: Diagnosis not present

## 2017-10-29 DIAGNOSIS — F209 Schizophrenia, unspecified: Secondary | ICD-10-CM | POA: Diagnosis not present

## 2017-10-29 DIAGNOSIS — F319 Bipolar disorder, unspecified: Secondary | ICD-10-CM | POA: Diagnosis not present

## 2017-10-29 DIAGNOSIS — E43 Unspecified severe protein-calorie malnutrition: Secondary | ICD-10-CM | POA: Diagnosis not present

## 2017-11-04 DIAGNOSIS — J449 Chronic obstructive pulmonary disease, unspecified: Secondary | ICD-10-CM | POA: Diagnosis not present

## 2017-11-04 DIAGNOSIS — F209 Schizophrenia, unspecified: Secondary | ICD-10-CM | POA: Diagnosis not present

## 2017-11-04 DIAGNOSIS — E118 Type 2 diabetes mellitus with unspecified complications: Secondary | ICD-10-CM | POA: Diagnosis not present

## 2017-11-04 DIAGNOSIS — F319 Bipolar disorder, unspecified: Secondary | ICD-10-CM | POA: Diagnosis not present

## 2017-11-04 DIAGNOSIS — I10 Essential (primary) hypertension: Secondary | ICD-10-CM | POA: Diagnosis not present

## 2017-11-04 DIAGNOSIS — E43 Unspecified severe protein-calorie malnutrition: Secondary | ICD-10-CM | POA: Diagnosis not present

## 2017-11-07 DIAGNOSIS — F209 Schizophrenia, unspecified: Secondary | ICD-10-CM | POA: Diagnosis not present

## 2017-11-07 DIAGNOSIS — I10 Essential (primary) hypertension: Secondary | ICD-10-CM | POA: Diagnosis not present

## 2017-11-07 DIAGNOSIS — F319 Bipolar disorder, unspecified: Secondary | ICD-10-CM | POA: Diagnosis not present

## 2017-11-07 DIAGNOSIS — E118 Type 2 diabetes mellitus with unspecified complications: Secondary | ICD-10-CM | POA: Diagnosis not present

## 2017-11-07 DIAGNOSIS — J449 Chronic obstructive pulmonary disease, unspecified: Secondary | ICD-10-CM | POA: Diagnosis not present

## 2017-11-07 DIAGNOSIS — E43 Unspecified severe protein-calorie malnutrition: Secondary | ICD-10-CM | POA: Diagnosis not present

## 2017-11-11 DIAGNOSIS — E118 Type 2 diabetes mellitus with unspecified complications: Secondary | ICD-10-CM | POA: Diagnosis not present

## 2017-11-11 DIAGNOSIS — F319 Bipolar disorder, unspecified: Secondary | ICD-10-CM | POA: Diagnosis not present

## 2017-11-11 DIAGNOSIS — R0602 Shortness of breath: Secondary | ICD-10-CM | POA: Diagnosis not present

## 2017-11-11 DIAGNOSIS — I10 Essential (primary) hypertension: Secondary | ICD-10-CM | POA: Diagnosis not present

## 2017-11-11 DIAGNOSIS — J449 Chronic obstructive pulmonary disease, unspecified: Secondary | ICD-10-CM | POA: Diagnosis not present

## 2017-11-11 DIAGNOSIS — R531 Weakness: Secondary | ICD-10-CM | POA: Diagnosis not present

## 2017-11-11 DIAGNOSIS — F209 Schizophrenia, unspecified: Secondary | ICD-10-CM | POA: Diagnosis not present

## 2017-11-11 DIAGNOSIS — E43 Unspecified severe protein-calorie malnutrition: Secondary | ICD-10-CM | POA: Diagnosis not present

## 2017-11-12 DIAGNOSIS — I10 Essential (primary) hypertension: Secondary | ICD-10-CM | POA: Diagnosis not present

## 2017-11-12 DIAGNOSIS — J449 Chronic obstructive pulmonary disease, unspecified: Secondary | ICD-10-CM | POA: Diagnosis not present

## 2017-11-12 DIAGNOSIS — E43 Unspecified severe protein-calorie malnutrition: Secondary | ICD-10-CM | POA: Diagnosis not present

## 2017-11-12 DIAGNOSIS — E118 Type 2 diabetes mellitus with unspecified complications: Secondary | ICD-10-CM | POA: Diagnosis not present

## 2017-11-12 DIAGNOSIS — F319 Bipolar disorder, unspecified: Secondary | ICD-10-CM | POA: Diagnosis not present

## 2017-11-12 DIAGNOSIS — F209 Schizophrenia, unspecified: Secondary | ICD-10-CM | POA: Diagnosis not present

## 2017-11-13 DIAGNOSIS — F319 Bipolar disorder, unspecified: Secondary | ICD-10-CM | POA: Diagnosis not present

## 2017-11-13 DIAGNOSIS — F209 Schizophrenia, unspecified: Secondary | ICD-10-CM | POA: Diagnosis not present

## 2017-11-13 DIAGNOSIS — E118 Type 2 diabetes mellitus with unspecified complications: Secondary | ICD-10-CM | POA: Diagnosis not present

## 2017-11-13 DIAGNOSIS — J449 Chronic obstructive pulmonary disease, unspecified: Secondary | ICD-10-CM | POA: Diagnosis not present

## 2017-11-13 DIAGNOSIS — E43 Unspecified severe protein-calorie malnutrition: Secondary | ICD-10-CM | POA: Diagnosis not present

## 2017-11-13 DIAGNOSIS — I10 Essential (primary) hypertension: Secondary | ICD-10-CM | POA: Diagnosis not present

## 2017-11-18 DIAGNOSIS — I10 Essential (primary) hypertension: Secondary | ICD-10-CM | POA: Diagnosis not present

## 2017-11-18 DIAGNOSIS — F209 Schizophrenia, unspecified: Secondary | ICD-10-CM | POA: Diagnosis not present

## 2017-11-18 DIAGNOSIS — F319 Bipolar disorder, unspecified: Secondary | ICD-10-CM | POA: Diagnosis not present

## 2017-11-18 DIAGNOSIS — E118 Type 2 diabetes mellitus with unspecified complications: Secondary | ICD-10-CM | POA: Diagnosis not present

## 2017-11-18 DIAGNOSIS — E43 Unspecified severe protein-calorie malnutrition: Secondary | ICD-10-CM | POA: Diagnosis not present

## 2017-11-18 DIAGNOSIS — J449 Chronic obstructive pulmonary disease, unspecified: Secondary | ICD-10-CM | POA: Diagnosis not present

## 2017-11-19 DIAGNOSIS — F209 Schizophrenia, unspecified: Secondary | ICD-10-CM | POA: Diagnosis not present

## 2017-11-19 DIAGNOSIS — E118 Type 2 diabetes mellitus with unspecified complications: Secondary | ICD-10-CM | POA: Diagnosis not present

## 2017-11-19 DIAGNOSIS — I10 Essential (primary) hypertension: Secondary | ICD-10-CM | POA: Diagnosis not present

## 2017-11-19 DIAGNOSIS — E43 Unspecified severe protein-calorie malnutrition: Secondary | ICD-10-CM | POA: Diagnosis not present

## 2017-11-19 DIAGNOSIS — J449 Chronic obstructive pulmonary disease, unspecified: Secondary | ICD-10-CM | POA: Diagnosis not present

## 2017-11-19 DIAGNOSIS — F319 Bipolar disorder, unspecified: Secondary | ICD-10-CM | POA: Diagnosis not present

## 2017-11-26 DIAGNOSIS — F319 Bipolar disorder, unspecified: Secondary | ICD-10-CM | POA: Diagnosis not present

## 2017-11-26 DIAGNOSIS — E43 Unspecified severe protein-calorie malnutrition: Secondary | ICD-10-CM | POA: Diagnosis not present

## 2017-11-26 DIAGNOSIS — F209 Schizophrenia, unspecified: Secondary | ICD-10-CM | POA: Diagnosis not present

## 2017-11-26 DIAGNOSIS — I10 Essential (primary) hypertension: Secondary | ICD-10-CM | POA: Diagnosis not present

## 2017-11-26 DIAGNOSIS — E118 Type 2 diabetes mellitus with unspecified complications: Secondary | ICD-10-CM | POA: Diagnosis not present

## 2017-11-26 DIAGNOSIS — J449 Chronic obstructive pulmonary disease, unspecified: Secondary | ICD-10-CM | POA: Diagnosis not present

## 2017-11-27 DIAGNOSIS — E43 Unspecified severe protein-calorie malnutrition: Secondary | ICD-10-CM | POA: Diagnosis not present

## 2017-11-27 DIAGNOSIS — I10 Essential (primary) hypertension: Secondary | ICD-10-CM | POA: Diagnosis not present

## 2017-11-27 DIAGNOSIS — E118 Type 2 diabetes mellitus with unspecified complications: Secondary | ICD-10-CM | POA: Diagnosis not present

## 2017-11-27 DIAGNOSIS — F319 Bipolar disorder, unspecified: Secondary | ICD-10-CM | POA: Diagnosis not present

## 2017-11-27 DIAGNOSIS — J449 Chronic obstructive pulmonary disease, unspecified: Secondary | ICD-10-CM | POA: Diagnosis not present

## 2017-11-27 DIAGNOSIS — F209 Schizophrenia, unspecified: Secondary | ICD-10-CM | POA: Diagnosis not present

## 2017-12-04 DIAGNOSIS — J449 Chronic obstructive pulmonary disease, unspecified: Secondary | ICD-10-CM | POA: Diagnosis not present

## 2017-12-04 DIAGNOSIS — E118 Type 2 diabetes mellitus with unspecified complications: Secondary | ICD-10-CM | POA: Diagnosis not present

## 2017-12-04 DIAGNOSIS — I10 Essential (primary) hypertension: Secondary | ICD-10-CM | POA: Diagnosis not present

## 2017-12-04 DIAGNOSIS — F319 Bipolar disorder, unspecified: Secondary | ICD-10-CM | POA: Diagnosis not present

## 2017-12-04 DIAGNOSIS — F209 Schizophrenia, unspecified: Secondary | ICD-10-CM | POA: Diagnosis not present

## 2017-12-04 DIAGNOSIS — E43 Unspecified severe protein-calorie malnutrition: Secondary | ICD-10-CM | POA: Diagnosis not present

## 2017-12-05 DIAGNOSIS — I10 Essential (primary) hypertension: Secondary | ICD-10-CM | POA: Diagnosis not present

## 2017-12-05 DIAGNOSIS — E43 Unspecified severe protein-calorie malnutrition: Secondary | ICD-10-CM | POA: Diagnosis not present

## 2017-12-05 DIAGNOSIS — F209 Schizophrenia, unspecified: Secondary | ICD-10-CM | POA: Diagnosis not present

## 2017-12-05 DIAGNOSIS — F319 Bipolar disorder, unspecified: Secondary | ICD-10-CM | POA: Diagnosis not present

## 2017-12-05 DIAGNOSIS — J449 Chronic obstructive pulmonary disease, unspecified: Secondary | ICD-10-CM | POA: Diagnosis not present

## 2017-12-05 DIAGNOSIS — E118 Type 2 diabetes mellitus with unspecified complications: Secondary | ICD-10-CM | POA: Diagnosis not present

## 2017-12-06 ENCOUNTER — Inpatient Hospital Stay (HOSPITAL_COMMUNITY)
Admission: EM | Admit: 2017-12-06 | Discharge: 2017-12-08 | DRG: 374 | Disposition: A | Discharge status: Hospice | Payer: Medicare Other | Attending: Internal Medicine | Admitting: Internal Medicine

## 2017-12-06 ENCOUNTER — Encounter (HOSPITAL_COMMUNITY): Payer: Self-pay

## 2017-12-06 ENCOUNTER — Emergency Department (HOSPITAL_COMMUNITY): Payer: Medicare Other

## 2017-12-06 ENCOUNTER — Other Ambulatory Visit: Payer: Self-pay

## 2017-12-06 DIAGNOSIS — F209 Schizophrenia, unspecified: Secondary | ICD-10-CM | POA: Diagnosis present

## 2017-12-06 DIAGNOSIS — Z7951 Long term (current) use of inhaled steroids: Secondary | ICD-10-CM | POA: Diagnosis not present

## 2017-12-06 DIAGNOSIS — K56609 Unspecified intestinal obstruction, unspecified as to partial versus complete obstruction: Secondary | ICD-10-CM | POA: Diagnosis not present

## 2017-12-06 DIAGNOSIS — E119 Type 2 diabetes mellitus without complications: Secondary | ICD-10-CM | POA: Diagnosis present

## 2017-12-06 DIAGNOSIS — L89312 Pressure ulcer of right buttock, stage 2: Secondary | ICD-10-CM | POA: Diagnosis present

## 2017-12-06 DIAGNOSIS — Z87891 Personal history of nicotine dependence: Secondary | ICD-10-CM | POA: Diagnosis not present

## 2017-12-06 DIAGNOSIS — K6289 Other specified diseases of anus and rectum: Secondary | ICD-10-CM | POA: Diagnosis not present

## 2017-12-06 DIAGNOSIS — Z681 Body mass index (BMI) 19 or less, adult: Secondary | ICD-10-CM

## 2017-12-06 DIAGNOSIS — C787 Secondary malignant neoplasm of liver and intrahepatic bile duct: Secondary | ICD-10-CM | POA: Diagnosis present

## 2017-12-06 DIAGNOSIS — R64 Cachexia: Secondary | ICD-10-CM | POA: Diagnosis present

## 2017-12-06 DIAGNOSIS — J449 Chronic obstructive pulmonary disease, unspecified: Secondary | ICD-10-CM | POA: Diagnosis present

## 2017-12-06 DIAGNOSIS — F0390 Unspecified dementia without behavioral disturbance: Secondary | ICD-10-CM | POA: Diagnosis present

## 2017-12-06 DIAGNOSIS — Z66 Do not resuscitate: Secondary | ICD-10-CM | POA: Diagnosis present

## 2017-12-06 DIAGNOSIS — K629 Disease of anus and rectum, unspecified: Secondary | ICD-10-CM | POA: Diagnosis not present

## 2017-12-06 DIAGNOSIS — F0281 Dementia in other diseases classified elsewhere with behavioral disturbance: Secondary | ICD-10-CM | POA: Diagnosis not present

## 2017-12-06 DIAGNOSIS — Z515 Encounter for palliative care: Secondary | ICD-10-CM | POA: Diagnosis present

## 2017-12-06 DIAGNOSIS — I10 Essential (primary) hypertension: Secondary | ICD-10-CM | POA: Diagnosis present

## 2017-12-06 DIAGNOSIS — L8990 Pressure ulcer of unspecified site, unspecified stage: Secondary | ICD-10-CM | POA: Diagnosis present

## 2017-12-06 DIAGNOSIS — E43 Unspecified severe protein-calorie malnutrition: Secondary | ICD-10-CM | POA: Diagnosis not present

## 2017-12-06 DIAGNOSIS — C801 Malignant (primary) neoplasm, unspecified: Secondary | ICD-10-CM | POA: Diagnosis present

## 2017-12-06 DIAGNOSIS — N281 Cyst of kidney, acquired: Secondary | ICD-10-CM | POA: Diagnosis not present

## 2017-12-06 DIAGNOSIS — E118 Type 2 diabetes mellitus with unspecified complications: Secondary | ICD-10-CM | POA: Diagnosis not present

## 2017-12-06 DIAGNOSIS — D375 Neoplasm of uncertain behavior of rectum: Principal | ICD-10-CM | POA: Diagnosis present

## 2017-12-06 DIAGNOSIS — F319 Bipolar disorder, unspecified: Secondary | ICD-10-CM | POA: Diagnosis not present

## 2017-12-06 HISTORY — DX: Malignant (primary) neoplasm, unspecified: C80.1

## 2017-12-06 LAB — COMPREHENSIVE METABOLIC PANEL
ALBUMIN: 3.1 g/dL — AB (ref 3.5–5.0)
ALT: 14 U/L (ref 0–44)
ANION GAP: 5 (ref 5–15)
AST: 26 U/L (ref 15–41)
Alkaline Phosphatase: 113 U/L (ref 38–126)
BILIRUBIN TOTAL: 0.6 mg/dL (ref 0.3–1.2)
BUN: 9 mg/dL (ref 8–23)
CO2: 34 mmol/L — ABNORMAL HIGH (ref 22–32)
Calcium: 9.1 mg/dL (ref 8.9–10.3)
Chloride: 98 mmol/L (ref 98–111)
Creatinine, Ser: 0.51 mg/dL (ref 0.44–1.00)
GFR calc non Af Amer: >60 mL/min (ref >60)
GLUCOSE: 116 mg/dL — AB (ref 70–99)
POTASSIUM: 4 mmol/L (ref 3.5–5.1)
Sodium: 137 mmol/L (ref 135–145)
TOTAL PROTEIN: 7.2 g/dL (ref 6.5–8.1)

## 2017-12-06 LAB — CBC WITH DIFFERENTIAL/PLATELET
Abs Immature Granulocytes: 0.06 K/uL (ref 0.00–0.07)
Basophils Absolute: 0 K/uL (ref 0.0–0.1)
Basophils Relative: 0 %
Eosinophils Absolute: 0 K/uL (ref 0.0–0.5)
Eosinophils Relative: 0 %
HCT: 44.2 % (ref 36.0–46.0)
Hemoglobin: 13.5 g/dL (ref 12.0–15.0)
Immature Granulocytes: 1 %
Lymphocytes Relative: 7 %
Lymphs Abs: 0.8 K/uL (ref 0.7–4.0)
MCH: 26.4 pg (ref 26.0–34.0)
MCHC: 30.5 g/dL (ref 30.0–36.0)
MCV: 86.3 fL (ref 80.0–100.0)
Monocytes Absolute: 0.6 K/uL (ref 0.1–1.0)
Monocytes Relative: 6 %
Neutro Abs: 9.5 K/uL — ABNORMAL HIGH (ref 1.7–7.7)
Neutrophils Relative %: 86 %
Platelets: 306 K/uL (ref 150–400)
RBC: 5.12 MIL/uL — ABNORMAL HIGH (ref 3.87–5.11)
RDW: 13.8 % (ref 11.5–15.5)
WBC: 11.1 K/uL — ABNORMAL HIGH (ref 4.0–10.5)
nRBC: 0 % (ref 0.0–0.2)

## 2017-12-06 MED ORDER — SODIUM CHLORIDE 0.9 % IV BOLUS
500.0000 mL | Freq: Once | INTRAVENOUS | Status: AC
  Administered 2017-12-06: 500 mL via INTRAVENOUS

## 2017-12-06 MED ORDER — LORAZEPAM 0.5 MG PO TABS
0.5000 mg | ORAL_TABLET | Freq: Every evening | ORAL | Status: DC | PRN
  Administered 2017-12-06: 0.5 mg via ORAL
  Filled 2017-12-06: qty 1

## 2017-12-06 MED ORDER — ACETAMINOPHEN 325 MG PO TABS
650.0000 mg | ORAL_TABLET | Freq: Four times a day (QID) | ORAL | Status: DC | PRN
  Administered 2017-12-07: 650 mg via ORAL
  Filled 2017-12-06: qty 2

## 2017-12-06 MED ORDER — ONDANSETRON HCL 4 MG/2ML IJ SOLN: 4.0000 mg | Freq: Four times a day (QID) | INTRAMUSCULAR | Status: DC | PRN

## 2017-12-06 MED ORDER — IPRATROPIUM-ALBUTEROL 0.5-2.5 (3) MG/3ML IN SOLN
3.0000 mL | Freq: Four times a day (QID) | RESPIRATORY_TRACT | Status: DC | PRN
  Filled 2017-12-06 (×2): qty 3

## 2017-12-06 MED ORDER — LORAZEPAM 2 MG/ML IJ SOLN
1.0000 mg | Freq: Once | INTRAMUSCULAR | Status: AC
  Administered 2017-12-06: 1 mg via INTRAVENOUS
  Filled 2017-12-06: qty 1

## 2017-12-06 MED ORDER — CLONIDINE HCL 0.1 MG PO TABS
0.1000 mg | ORAL_TABLET | Freq: Once | ORAL | Status: AC
  Administered 2017-12-06: 0.1 mg via ORAL
  Filled 2017-12-06: qty 1

## 2017-12-06 MED ORDER — HYDRALAZINE HCL 25 MG PO TABS
50.0000 mg | ORAL_TABLET | Freq: Three times a day (TID) | ORAL | Status: DC
  Administered 2017-12-06 – 2017-12-08 (×5): 50 mg via ORAL
  Filled 2017-12-06 (×5): qty 2

## 2017-12-06 MED ORDER — MORPHINE SULFATE (CONCENTRATE) 10 MG/0.5ML PO SOLN
5.0000 mg | ORAL | Status: DC | PRN
  Administered 2017-12-07 – 2017-12-08 (×4): 5 mg via ORAL
  Filled 2017-12-06 (×4): qty 0.5

## 2017-12-06 MED ORDER — BUDESONIDE 0.25 MG/2ML IN SUSP
0.2500 mg | Freq: Two times a day (BID) | RESPIRATORY_TRACT | Status: DC
  Administered 2017-12-06 – 2017-12-08 (×4): 0.25 mg via RESPIRATORY_TRACT
  Filled 2017-12-06 (×4): qty 2

## 2017-12-06 MED ORDER — ACETAMINOPHEN 650 MG RE SUPP: 650.0000 mg | Freq: Four times a day (QID) | RECTAL | Status: DC | PRN

## 2017-12-06 MED ORDER — TRAZODONE HCL 50 MG PO TABS
100.0000 mg | ORAL_TABLET | Freq: Every evening | ORAL | Status: DC | PRN
  Administered 2017-12-06 – 2017-12-08 (×2): 100 mg via ORAL
  Filled 2017-12-06 (×2): qty 2

## 2017-12-06 MED ORDER — SODIUM CHLORIDE 0.9 % IV SOLN
INTRAVENOUS | Status: DC
Start: 2017-12-06 — End: 2017-12-08
  Administered 2017-12-06 – 2017-12-07 (×4): via INTRAVENOUS

## 2017-12-06 MED ORDER — CLONIDINE HCL 0.1 MG PO TABS
0.1000 mg | ORAL_TABLET | Freq: Three times a day (TID) | ORAL | Status: DC
  Administered 2017-12-06 – 2017-12-08 (×5): 0.1 mg via ORAL
  Filled 2017-12-06 (×5): qty 1

## 2017-12-06 MED ORDER — HYDRALAZINE HCL 20 MG/ML IJ SOLN
10.0000 mg | Freq: Once | INTRAMUSCULAR | Status: AC
  Administered 2017-12-06: 10 mg via INTRAVENOUS
  Filled 2017-12-06: qty 1

## 2017-12-06 MED ORDER — ACETAMINOPHEN 325 MG PO TABS: 650.0000 mg | ORAL_TABLET | Freq: Four times a day (QID) | ORAL | Status: DC | PRN

## 2017-12-06 MED ORDER — ONDANSETRON HCL 4 MG PO TABS: 4.0000 mg | ORAL_TABLET | Freq: Four times a day (QID) | ORAL | Status: DC | PRN

## 2017-12-06 MED ORDER — IOPAMIDOL (ISOVUE-300) INJECTION 61%
75.0000 mL | Freq: Once | INTRAVENOUS | Status: AC | PRN
  Administered 2017-12-06: 75 mL via INTRAVENOUS

## 2017-12-06 MED ORDER — HALOPERIDOL 5 MG PO TABS
5.0000 mg | ORAL_TABLET | Freq: Every day | ORAL | Status: DC
  Administered 2017-12-06 – 2017-12-07 (×2): 5 mg via ORAL
  Filled 2017-12-06 (×2): qty 1

## 2017-12-06 NOTE — ED Provider Notes (Addendum)
[facility] EMERGENCY DEPARTMENT Provider Note   CSN: [id number] Arrival date & time: 12/06/17  7829     History   Chief Complaint Chief Complaint  Patient presents with  . Rectal Bleeding    HPI Brittney Wall is a 72 y.o. female.  Patient is a DNR.  Patient with known mets to the liver primary not precisely known best I can tell from reading through the notes.  Patient was evaluated by heme-onc.  But due to her long-standing history of schizophrenia and dementia and bipolar disorder that opted not to treat this.  There was no biopsy.  This was initially identified back in March.  Patient brought in by her daughter today.  Patient has not had a bowel movement since Monday.  No vomiting.  Patient started to have pus that drained out as of Thursday from her perianal area.     Past Medical History:  Diagnosis Date  . Bipolar 1 disorder ([place])   . Cancer ([place])   . Dementia ([place])   . Hypertension   . Left adrenal mass ([place]) 09/06/2016  . Pneumothorax on right 09/07/2016  . Schizophrenia [facility])     Patient Active Problem List   Diagnosis Date Noted  . Goals of care, counseling/discussion   . Palliative care by specialist   . Encounter for hospice care discussion   . Liver metastases ([place]) 08/30/2017  . Pulmonary metastases ([place]) 08/30/2017  . Community acquired pneumonia 08/30/2017  . Lactic acidosis 08/30/2017  . COPD (chronic obstructive pulmonary disease) ([place]) 08/30/2017  . Diabetes ([place]) 08/30/2017  . Essential hypertension 08/30/2017  . Hyperlipidemia 08/30/2017  . Pneumonia 08/30/2017  . Fascial defect   . Wound dehiscence 01/03/2017  . Small bowel obstruction ([place]) 12/23/2016  . Hypokalemia 12/23/2016  . Obturator hernia   . Ischemic bowel disease ([place])   . Pneumothorax on right 09/07/2016  . Left adrenal mass ([place]) 09/06/2016  . Abnormal CT scan 09/06/2016  . Compression fracture of L5 lumbar vertebra 09/06/2016  . Malignant hypertension 09/06/2016  .  Ileus ([place]) 09/05/2016  . Chest pain 03/15/2015  . Hypertensive urgency 03/15/2015  . Schizophrenia ([place]) 03/15/2015  . Tobacco abuse 03/15/2015  . Dementia ([place]) 03/15/2015  . Protein-calorie malnutrition, severe ([place]) 03/15/2015  . Chest pain at rest 03/15/2015    Past Surgical History:  Procedure Laterality Date  . BOWEL RESECTION  12/23/2016   Procedure: SMALL BOWEL RESECTION;  Surgeon: Virl Cagey, MD;  Location: [facility];  Service: General;;  . Fatima Blank HERNIA REPAIR  01/04/2017   Procedure: Jerry Caras WITH MESH;  Surgeon: Aviva Signs, MD;  Location: [facility];  Service: General;;  . LAPAROTOMY N/A 12/23/2016   Procedure: EXPLORATORY LAPAROTOMY;  Surgeon: Virl Cagey, MD;  Location: [facility];  Service: General;  Laterality: N/A;  . ortho procedure     for broken legs or ankles daughter unsure  . RADIOLOGY WITH ANESTHESIA N/A 06/03/2017   Procedure: MRI WITH ANESTHESIA LIVER WITH AND WITHOUT CONTRAST;  Surgeon: Radiologist, Medication, MD;  Location: [place];  Service: Radiology;  Laterality: N/A;  . RADIOLOGY WITH ANESTHESIA N/A 07/24/2017   Procedure: CT WITH ANESTHESIA OF CHEST WITH CONTRAST,SOFT TISSUE OF NECK WITH CONTAST;  Surgeon: Radiologist, Medication, MD;  Location: [place];  Service: Radiology;  Laterality: N/A;     OB History   None      Home Medications    Prior to Admission medications   Medication Sig Start Date End Date Taking? Authorizing Provider  acetaminophen (TYLENOL) 325 MG tablet Take 2 tablets (650 mg total) by mouth every 6 (six) hours as needed for mild pain or headache (or Fever >/= 101). 09/03/17  Yes Barton Dubois, MD  cloNIDine (CATAPRES) 0.1 MG tablet Take 0.1 mg by mouth 3 (three) times daily.     Yes [provider]  haloperidol (HALDOL) 5 MG tablet Take 5 mg by mouth at bedtime.  02/16/15  Yes [provider]  hydrALAZINE (APRESOLINE) 50 MG tablet Take 1 tablet (50 mg total) by mouth every 8 (eight)  hours. 09/03/17  Yes Barton Dubois, MD  LORazepam (ATIVAN) 0.5 MG tablet Take 0.5 mg by mouth at bedtime as needed for sleep.  07/19/16  Yes [provider]  traZODone (DESYREL) 100 MG tablet Take 100 mg by mouth at bedtime as needed for sleep.    Yes [provider]  budesonide (PULMICORT) 0.25 MG/2ML nebulizer solution Take 2 mLs (0.25 mg total) by nebulization 2 (two) times daily. 09/03/17   Barton Dubois, MD  ipratropium-albuterol (DUONEB) 0.5-2.5 (3) MG/3ML SOLN Take 3 mLs by nebulization every 6 (six) hours as needed. 09/03/17   Barton Dubois, MD  morphine 20 MG/5ML solution Take 1.3 mLs (5.2 mg total) by mouth every 4 (four) hours as needed for pain. 09/03/17   Barton Dubois, MD    Family History No family history on file.  Social History Social History   Tobacco Use  . Smoking status: Former Smoker    Packs/day: 0.50    Types: Cigarettes  . Smokeless tobacco: Never Used  Substance Use Topics  . Alcohol use: No  . Drug use: No     Allergies   Patient has no known allergies.   Review of Systems Review of Systems  Unable to perform ROS: Dementia     Physical Exam Updated Vital Signs BP (!) 179/74   Pulse 77   Temp 97.9 F (36.6 C) (Rectal)   Resp 19   Ht 1.676 m (5\' 6" )   Wt 40.8 kg   SpO2 100%   BMI 14.53 kg/m   Physical Exam  Constitutional: No distress.  Very cachectic  HENT:  Head: Normocephalic and atraumatic.  Mucous membranes dry.  Eyes: Pupils are equal, round, and reactive to light. Conjunctivae and EOM are normal.  Cardiovascular: Normal rate, regular rhythm and normal heart sounds.  Pulmonary/Chest: Effort normal and breath sounds normal. No respiratory distress.  Abdominal: Soft. Bowel sounds are normal. There is no tenderness.  Genitourinary:  Genitourinary Comments: Patient perianal area on the left anterior part of the anus with about 4 cm area of induration with some spontaneous purulent discharge.  Lots of external  hemorrhoid skin tags.  The bit of a bulge into the left anal area.  Musculoskeletal: Normal range of motion.  Neurological: She is alert. No cranial nerve deficit.  With a history of dementia.  Sleep some awake other times.  Does move all extremities spontaneously.  Skin: Skin is warm.  Nursing note and vitals reviewed.    ED Treatments / Results  Labs (all labs ordered are listed, but only abnormal results are displayed) Labs Reviewed  CBC WITH DIFFERENTIAL/PLATELET - Abnormal; Notable for the following components:      Result Value   WBC 11.1 (*)    RBC 5.12 (*)    Neutro Abs 9.5 (*)    All other components within normal limits  COMPREHENSIVE METABOLIC PANEL - Abnormal; Notable for the following components:   CO2 34 (*)  Glucose, Bld 116 (*)    Albumin 3.1 (*)    All other components within normal limits    EKG None  Radiology Ct Abdomen Pelvis W Contrast  Result Date: 12/06/2017 CLINICAL DATA:  Abdominal pain and bright red blood per rectum today. No bowel movement for the past 5 days. EXAM: CT ABDOMEN AND PELVIS WITH CONTRAST TECHNIQUE: Multidetector CT imaging of the abdomen and pelvis was performed using the standard protocol following bolus administration of intravenous contrast. CONTRAST:  52mL ISOVUE-300 IOPAMIDOL (ISOVUE-300) INJECTION 61% COMPARISON:  Abdomen pelvis CT dated 04/17/2017 and liver MRI dated 06/03/2017. Chest CT dated 07/24/2017. FINDINGS: Lower chest: Clear lung bases. Hepatobiliary: The 5.1 cm right lobe liver mass near the dome of the liver on 07/24/2017 measures 5.7 x 4.6 cm on image number 9 series 2 today. A more inferior right lobe liver mass seen on 06/03/2017, currently measures 6.8 x 5.3 cm on image number 23 series 2, previously 2.3 cm. The previously seen smaller masses in the left lobe of the liver are not clearly visualized today. Normal appearing gallbladder. Pancreas: Unremarkable. No pancreatic ductal dilatation or surrounding inflammatory  changes. Spleen: Normal in size without focal abnormality. Adrenals/Urinary Tract: Previously demonstrated small left renal cysts and not changed significantly. Unremarkable right kidney and urinary bladder. The ureters are not well visualized. The previously demonstrated 1.4 cm left adrenal mass measures 1.9 x 1.5 cm on image number 15 series 2 and is mildly heterogeneous. Normal appearing right adrenal gland. Stomach/Bowel: Prominent stool in the colon. Anastomotic staples in the inferior pelvis in the midline. Unremarkable stomach and small bowel. No evidence of appendicitis. Vascular/Lymphatic: Extensive atheromatous arterial calcifications without aneurysm. No enlarged lymph nodes. Reproductive: Grossly stable probable uterine fibroid on the right. Other: Interval heterogeneously enhancing perianal mass, measuring 5.5 x 4.1 cm on image number 74 series 2. This measures 4.7 cm in length on coronal image number 52 series 5. Interval small amount of free peritoneal fluid. Musculoskeletal: Stable old L5 vertebral compression fracture with mild bony retropulsion. Mild lumbar and lower thoracic spine degenerative changes. No evidence of bony metastatic disease. IMPRESSION: 1. 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 2. Interval mild increase in size of the previously seen probable liver metastases in the right lobe of the liver. The probable metastases in the left lobe of the liver are not clearly visible today. 3. Interval small amount of free peritoneal fluid. 4. Mild increase in size of a heterogeneous left adrenal mass, suspicious for a metastasis based on the CT appearance. On the MR, this had characteristics of a benign adenoma. 5. Prominent stool in the colon, probably due to obstruction of the anus by the large anal mass. Electronically Signed   By: Claudie Revering M.D.   On: 12/06/2017 15:41    Procedures Procedures (including critical care time)  Medications  Ordered in ED Medications  0.9 %  sodium chloride infusion ( Intravenous New Bag/Given 12/06/17 1354)  sodium chloride 0.9 % bolus 500 mL (0 mLs Intravenous Stopped 12/06/17 1214)  LORazepam (ATIVAN) injection 1 mg (1 mg Intravenous Given 12/06/17 1454)  iopamidol (ISOVUE-300) 61 % injection 75 mL (75 mLs Intravenous Contrast Given 12/06/17 1520)     Initial Impression / Assessment and Plan / ED Course  I have reviewed the triage vital signs and the nursing notes.  Pertinent labs & imaging results that were available during my care of the patient were reviewed by me and considered  in my medical decision making (see chart for details).     Clinically patient with a perianal abscess left side lateral anterior with an area of induration of 4 cm with spontaneous purulent discharge.  Family stated patient has not had a bowel movement since Monday but is passing gas.  CT scan abdomen pelvis was done to further evaluate this.  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  Patient with known liver mets and opted not to have any treatment was seen by heme-onc.  Patient is under hospice care.  But now we have this possible obstructing mass.  We will discuss with Dr. Arnoldo Morale from general surgery patient may very well require admission by the hospitalist may require some radiation treatment in order to help with this obstructing mass.    Final Clinical Impressions(s) / ED Diagnoses   Final diagnoses:  Rectal mass    ED Discharge Orders    None       Fredia Sorrow, MD 12/06/17 1626  Addendum: Discussed with the general surgery Dr. Arnoldo Morale.  He feels that a loop diverting colostomy would be appropriate for this circumstance.  He will consult on the patient and see her tomorrow morning.  In discussions with the patient's daughter I think that if the patient is able to tolerate this they are going to want to do this.    Fredia Sorrow, MD 12/06/17 [phone]

## 2017-12-06 NOTE — ED Triage Notes (Signed)
Daughter reports pt has not had a bm sinced Monday.  Reports she saw bright red blood coming from rectum this morn\ing.  Pt c/o abd pain.

## 2017-12-06 NOTE — ED Notes (Signed)
Hospitalist at bedside at this time 

## 2017-12-06 NOTE — H&P (Signed)
History and Physical  [name] VPX:[id number] DOB: 03/08/1945 DOA: 12/06/2017  Referring physician: Dr Rogene Houston, ED physician PCP: [name], MD   Patient Coming From: home  Chief Complaint: no stool x1 week  HPI: Brittney Wall is a 72 y.o. female with a history of dementia, metastatic cancer with unknown primary, bipolar disorder, schizophrenia, hospice care.  Patient brought to the hospital by patient's daughter, who is the primary caregiver, who provides the history.  Patient has been without defecation over the past week.  No particular abdominal distention or decreased p.o. intake, which is pretty minimal.  There is also been known nausea and vomiting.  They have not tried anything to help with her stooling.  Her daughter has noted some purulent drainage from her anus.  Emergency Department Course: CT of the abdomen and pelvis show a rectal mass with a large stool burden  Review of Systems:   Unable to obtain  Past Medical History:  Diagnosis Date  . Bipolar 1 disorder ([place])   . Cancer ([place])   . Dementia ([place])   . Hypertension   . Left adrenal mass ([place]) 09/06/2016  . Pneumothorax on right 09/07/2016  . Schizophrenia [facility])    Past Surgical History:  Procedure Laterality Date  . BOWEL RESECTION  12/23/2016   Procedure: SMALL BOWEL RESECTION;  Surgeon: Virl Cagey, MD;  Location: [facility];  Service: General;;  . Fatima Blank HERNIA REPAIR  01/04/2017   Procedure: Jerry Caras WITH MESH;  Surgeon: Aviva Signs, MD;  Location: [facility];  Service: General;;  . LAPAROTOMY N/A 12/23/2016   Procedure: EXPLORATORY LAPAROTOMY;  Surgeon: Virl Cagey, MD;  Location: [facility];  Service: General;  Laterality: N/A;  . ortho procedure     for broken legs or ankles daughter unsure  . RADIOLOGY WITH ANESTHESIA N/A 06/03/2017   Procedure: MRI WITH ANESTHESIA LIVER WITH AND WITHOUT CONTRAST;  Surgeon: Radiologist, Medication, MD;  Location: [place];  Service:  Radiology;  Laterality: N/A;  . RADIOLOGY WITH ANESTHESIA N/A 07/24/2017   Procedure: CT WITH ANESTHESIA OF CHEST WITH CONTRAST,SOFT TISSUE OF NECK WITH CONTAST;  Surgeon: Radiologist, Medication, MD;  Location: [place];  Service: Radiology;  Laterality: N/A;   Social History:  reports that she has quit smoking. Her smoking use included cigarettes. She smoked 0.50 packs per day. She has never used smokeless tobacco. She reports that she does not drink alcohol or use drugs. Patient lives at home with daughter  No Known Allergies  No family history on file.  No known medical conditions in the family  Prior to Admission medications   Medication Sig Start Date End Date Taking? Authorizing Provider  acetaminophen (TYLENOL) 325 MG tablet Take 2 tablets (650 mg total) by mouth every 6 (six) hours as needed for mild pain or headache (or Fever >/= 101). 09/03/17  Yes Barton Dubois, MD  cloNIDine (CATAPRES) 0.1 MG tablet Take 0.1 mg by mouth 3 (three) times daily.     Yes [provider]  haloperidol (HALDOL) 5 MG tablet Take 5 mg by mouth at bedtime.  02/16/15  Yes [provider]  hydrALAZINE (APRESOLINE) 50 MG tablet Take 1 tablet (50 mg total) by mouth every 8 (eight) hours. 09/03/17  Yes Barton Dubois, MD  LORazepam (ATIVAN) 0.5 MG tablet Take 0.5 mg by mouth at bedtime as needed for sleep.  07/19/16  Yes [provider]  traZODone (DESYREL) 100 MG tablet Take 100 mg by mouth at bedtime as needed  for sleep.    Yes [provider]  budesonide (PULMICORT) 0.25 MG/2ML nebulizer solution Take 2 mLs (0.25 mg total) by nebulization 2 (two) times daily. 09/03/17   Barton Dubois, MD  ipratropium-albuterol (DUONEB) 0.5-2.5 (3) MG/3ML SOLN Take 3 mLs by nebulization every 6 (six) hours as needed. 09/03/17   Barton Dubois, MD  morphine 20 MG/5ML solution Take 1.3 mLs (5.2 mg total) by mouth every 4 (four) hours as needed for pain. 09/03/17   Barton Dubois, MD    Physical  Exam: BP (!) 243/95   Pulse 79   Temp 98.3 F (36.8 C) (Oral)   Resp 17   Ht 5\' 6"  (1.676 m)   Wt 40.8 kg   SpO2 99%   BMI 14.53 kg/m   . General: Elderly female. Awake and alert. No acute cardiopulmonary distress.  Marland Kitchen HEENT: Normocephalic atraumatic.  Right and left ears normal in appearance.  Pupils equal, round, reactive to light. Extraocular muscles are intact. Sclerae anicteric and noninjected.  Moist mucosal membranes. No mucosal lesions.  . Neck: Neck supple without lymphadenopathy. No carotid bruits. No masses palpated.  . Cardiovascular: Regular rate with normal S1-S2 sounds. No murmurs, rubs, gallops auscultated. No JVD.  Marland Kitchen Respiratory: Good respiratory effort with no wheezes, rales, rhonchi. Lungs clear to auscultation bilaterally.  No accessory muscle use. . Abdomen: Soft, nontender.  Large stool burden palpated in the abdomen.  Slightly enlarged liver margins.    . Skin: No rashes, lesions, or ulcerations.  Dry, warm to touch. 2+ dorsalis pedis and radial pulses. . Musculoskeletal: No calf or leg pain. All major joints not erythematous nontender.  No upper or lower joint deformation.  Good ROM.  No contractures  . Psychiatric: Intact judgment and insight. Pleasant and cooperative. . Neurologic: No focal neurological deficits. Strength is 5/5 and symmetric in upper and lower extremities.  Cranial nerves II through XII are grossly intact.           Labs on Admission: I have personally reviewed following labs and imaging studies  CBC: Recent Labs  Lab 12/06/17 1134  WBC 11.1*  NEUTROABS 9.5*  HGB 13.5  HCT 44.2  MCV 86.3  PLT 993   Basic Metabolic Panel: Recent Labs  Lab 12/06/17 1134  NA 137  K 4.0  CL 98  CO2 34*  GLUCOSE 116*  BUN 9  CREATININE 0.51  CALCIUM 9.1   GFR: Estimated Creatinine Clearance: 41.5 mL/min (by C-G formula based on SCr of 0.51 mg/dL). Liver Function Tests: Recent Labs  Lab 12/06/17 1134  AST 26  ALT 14  ALKPHOS 113   BILITOT 0.6  PROT 7.2  ALBUMIN 3.1*   No results for input(s): LIPASE, AMYLASE in the last 168 hours. No results for input(s): AMMONIA in the last 168 hours. Coagulation Profile: No results for input(s): INR, PROTIME in the last 168 hours. Cardiac Enzymes: No results for input(s): CKTOTAL, CKMB, CKMBINDEX, TROPONINI in the last 168 hours. BNP (last 3 results) No results for input(s): PROBNP in the last 8760 hours. HbA1C: No results for input(s): HGBA1C in the last 72 hours. CBG: No results for input(s): GLUCAP in the last 168 hours. Lipid Profile: No results for input(s): CHOL, HDL, LDLCALC, TRIG, CHOLHDL, LDLDIRECT in the last 72 hours. Thyroid Function Tests: No results for input(s): TSH, T4TOTAL, FREET4, T3FREE, THYROIDAB in the last 72 hours. Anemia Panel: No results for input(s): VITAMINB12, FOLATE, FERRITIN, TIBC, IRON, RETICCTPCT in the last 72 hours. Urine analysis:    Component  Value Date/Time   COLORURINE YELLOW 08/29/2017 0057   APPEARANCEUR CLOUDY (A) 08/29/2017 0057   LABSPEC 1.013 08/29/2017 0057   PHURINE 6.0 08/29/2017 0057   GLUCOSEU >=500 (A) 08/29/2017 0057   HGBUR MODERATE (A) 08/29/2017 0057   BILIRUBINUR NEGATIVE 08/29/2017 0057   KETONESUR NEGATIVE 08/29/2017 0057   PROTEINUR 100 (A) 08/29/2017 0057   UROBILINOGEN 0.2 03/12/2013 1350   NITRITE NEGATIVE 08/29/2017 0057   LEUKOCYTESUR NEGATIVE 08/29/2017 0057   Sepsis Labs: @LABRCNTIP (procalcitonin:4,lacticidven:4) )No results found for this or any previous visit (from the past 240 hour(s)).   Radiological Exams on Admission: Ct Abdomen Pelvis W Contrast  Result Date: 12/06/2017 CLINICAL DATA:  Abdominal pain and bright red blood per rectum today. No bowel movement for the past 5 days. EXAM: CT ABDOMEN AND PELVIS WITH CONTRAST TECHNIQUE: Multidetector CT imaging of the abdomen and pelvis was performed using the standard protocol following bolus administration of intravenous contrast. CONTRAST:   35mL ISOVUE-300 IOPAMIDOL (ISOVUE-300) INJECTION 61% COMPARISON:  Abdomen pelvis CT dated 04/17/2017 and liver MRI dated 06/03/2017. Chest CT dated 07/24/2017. FINDINGS: Lower chest: Clear lung bases. Hepatobiliary: The 5.1 cm right lobe liver mass near the dome of the liver on 07/24/2017 measures 5.7 x 4.6 cm on image number 9 series 2 today. A more inferior right lobe liver mass seen on 06/03/2017, currently measures 6.8 x 5.3 cm on image number 23 series 2, previously 2.3 cm. The previously seen smaller masses in the left lobe of the liver are not clearly visualized today. Normal appearing gallbladder. Pancreas: Unremarkable. No pancreatic ductal dilatation or surrounding inflammatory changes. Spleen: Normal in size without focal abnormality. Adrenals/Urinary Tract: Previously demonstrated small left renal cysts and not changed significantly. Unremarkable right kidney and urinary bladder. The ureters are not well visualized. The previously demonstrated 1.4 cm left adrenal mass measures 1.9 x 1.5 cm on image number 15 series 2 and is mildly heterogeneous. Normal appearing right adrenal gland. Stomach/Bowel: Prominent stool in the colon. Anastomotic staples in the inferior pelvis in the midline. Unremarkable stomach and small bowel. No evidence of appendicitis. Vascular/Lymphatic: Extensive atheromatous arterial calcifications without aneurysm. No enlarged lymph nodes. Reproductive: Grossly stable probable uterine fibroid on the right. Other: Interval heterogeneously enhancing perianal mass, measuring 5.5 x 4.1 cm on image number 74 series 2. This measures 4.7 cm in length on coronal image number 52 series 5. Interval small amount of free peritoneal fluid. Musculoskeletal: Stable old L5 vertebral compression fracture with mild bony retropulsion. Mild lumbar and lower thoracic spine degenerative changes. No evidence of bony metastatic disease. IMPRESSION: 1. Interval 5.5 x 4.7 x 4.1 cm perianal mass compatible with  a primary perianal carcinoma. 2. Interval mild increase in size of the previously seen probable liver metastases in the right lobe of the liver. The probable metastases in the left lobe of the liver are not clearly visible today. 3. Interval small amount of free peritoneal fluid. 4. Mild increase in size of a heterogeneous left adrenal mass, suspicious for a metastasis based on the CT appearance. On the MR, this had characteristics of a benign adenoma. 5. Prominent stool in the colon, probably due to obstruction of the anus by the large anal mass. Electronically Signed   By: Claudie Revering M.D.   On: 12/06/2017 15:41    Assessment/Plan: Principal Problem:   Large bowel obstruction (HCC) Active Problems:   Schizophrenia (HCC)   Dementia (HCC)   Protein-calorie malnutrition, severe ([place])   Diabetes ([place])   Essential hypertension  Rectal mass    This patient was discussed with the ED physician, including pertinent vitals, physical exam findings, labs, and imaging.  We also discussed care given by the ED provider.  1. Large bowel obstruction a. Clear liquids for now, then n.p.o. b. General surgery consulted: Question possible loop diversion colostomy 2. Rectal mass a. Possible abscess versus necrotic tumor with draining.  At this point, as abscesses spontaneously draining, will hold off on antibiotics 3. Protein calorie malnutrition 4. Hypertension a. Continue antihypertensives as needed b. In order to improve compliance, will place the patient on a clonidine patch and continue with hydralazine as needed 5. Diabetes 6. Dementia 7. Schizophrenia  DVT prophylaxis: Teds Consultants: General surgery Code Status: DNR Family Communication: Daughter and granddaughter present during exam and interview Disposition Plan: Patient to return home following improvement   Ameria Sanjurjo [place] [facility] Pager [phone]  If 7PM-7AM, please contact night-coverage [email or web address] Password  [email or web address]

## 2017-12-07 ENCOUNTER — Other Ambulatory Visit: Payer: Self-pay

## 2017-12-07 ENCOUNTER — Encounter (HOSPITAL_COMMUNITY): Payer: Self-pay | Admitting: Family Medicine

## 2017-12-07 DIAGNOSIS — E43 Unspecified severe protein-calorie malnutrition: Secondary | ICD-10-CM

## 2017-12-07 DIAGNOSIS — K6289 Other specified diseases of anus and rectum: Secondary | ICD-10-CM

## 2017-12-07 DIAGNOSIS — K56609 Unspecified intestinal obstruction, unspecified as to partial versus complete obstruction: Secondary | ICD-10-CM

## 2017-12-07 DIAGNOSIS — L8990 Pressure ulcer of unspecified site, unspecified stage: Secondary | ICD-10-CM | POA: Diagnosis present

## 2017-12-07 MED ORDER — ENSURE ENLIVE PO LIQD
237.0000 mL | Freq: Two times a day (BID) | ORAL | Status: DC
  Administered 2017-12-07 – 2017-12-08 (×3): 237 mL via ORAL

## 2017-12-07 MED ORDER — LORAZEPAM 0.5 MG PO TABS
0.5000 mg | ORAL_TABLET | Freq: Three times a day (TID) | ORAL | Status: DC
  Administered 2017-12-07 – 2017-12-08 (×4): 0.5 mg via ORAL
  Filled 2017-12-07 (×4): qty 1

## 2017-12-07 NOTE — Progress Notes (Signed)
PROGRESS NOTE  Brittney Wall  WFU:[id number]  DOB: 1945-09-25  DOA: 12/06/2017 PCP: [name], MD   Brief Admission Hx:  72 y.o. female with a history of dementia, metastatic cancer with unknown primary, bipolar disorder, schizophrenia, hospice care.  Patient brought to the hospital by patient's daughter, who is the primary caregiver, who provides the history.  Patient has been without defecation over the past week.  No particular abdominal distention or decreased p.o. intake, which is pretty minimal.  There is also been known nausea and vomiting.  They have not tried anything to help with her stooling.  Her daughter has noted some purulent drainage from her anus.  MDM/Assessment & Plan:   1. Obstructing rectal mass  - Pt was seen by Dr. Arnoldo Morale and he discussed options with family.  For now, patient is not vomiting or showing any signs of full obstruction.  Starting full liquid diet for now. Try to delay diverting transverse loop colostomy unless the patient is actively vomiting or has severe abdominal distention.  Pt is end of life and focus should be on comfort.  Family in agreement. Will try the diet today and see how patient responds.    2. HTN - patient was started on a clonidine patch on admission, hydralazine ordered as needed.  3. Dementia - stable, unchanged.  4. Severe protein calorie malnutrition - due to metastatic end-stage cancer.  DVT prophylaxis: TED hoses Code Status: DNR  Family Communication: daughter at bedside  Disposition Plan: hospice   Consultants:  Surgery Dr. Arnoldo Morale  Procedures:  n/a  Antimicrobials:  n/a   Subjective: Pt appears in no distress.   Objective: Vitals:   12/06/17 2125 12/07/17 0640 12/07/17 0646 12/07/17 0724  BP:   (!) 192/80   Pulse: 84  82   Resp: 18  18   Temp:  98.6 F (37 C)    TempSrc:  Oral    SpO2: 99%  100% 92%  Weight:      Height:        Intake/Output Summary (Last 24 hours) at 12/07/2017 1014 Last data  filed at 12/07/2017 0900 Gross per 24 hour  Intake 1728.01 ml  Output -  Net 1728.01 ml   Filed Weights   12/06/17 0945 12/06/17 2034  Weight: 40.8 kg 37.9 kg     REVIEW OF SYSTEMS  Unable to obtain due to condition  Exam:  General exam: chronically ill emaciated demented patient lying in bed, fidgeting, no apparent distress. Respiratory system: Clear. No increased work of breathing. Cardiovascular system: normal S1 & S2 heard.  Gastrointestinal system: Abdomen is nondistended, soft. bowel sounds heard. Central nervous system: demented, moving all extremities.  Extremities: no CCE.  Data Reviewed: Basic Metabolic Panel: Recent Labs  Lab 12/06/17 1134  NA 137  K 4.0  CL 98  CO2 34*  GLUCOSE 116*  BUN 9  CREATININE 0.51  CALCIUM 9.1   Liver Function Tests: Recent Labs  Lab 12/06/17 1134  AST 26  ALT 14  ALKPHOS 113  BILITOT 0.6  PROT 7.2  ALBUMIN 3.1*   No results for input(s): LIPASE, AMYLASE in the last 168 hours. No results for input(s): AMMONIA in the last 168 hours. CBC: Recent Labs  Lab 12/06/17 1134  WBC 11.1*  NEUTROABS 9.5*  HGB 13.5  HCT 44.2  MCV 86.3  PLT 306   Cardiac Enzymes: No results for input(s): CKTOTAL, CKMB, CKMBINDEX, TROPONINI in the last 168 hours. CBG (last 3)  No results for  input(s): GLUCAP in the last 72 hours. No results found for this or any previous visit (from the past 240 hour(s)).   Studies: Ct Abdomen Pelvis W Contrast  Result Date: 12/06/2017 CLINICAL DATA:  Abdominal pain and bright red blood per rectum today. No bowel movement for the past 5 days. EXAM: CT ABDOMEN AND PELVIS WITH CONTRAST TECHNIQUE: Multidetector CT imaging of the abdomen and pelvis was performed using the standard protocol following bolus administration of intravenous contrast. CONTRAST:  71mL ISOVUE-300 IOPAMIDOL (ISOVUE-300) INJECTION 61% COMPARISON:  Abdomen pelvis CT dated 04/17/2017 and liver MRI dated 06/03/2017. Chest CT dated  07/24/2017. FINDINGS: Lower chest: Clear lung bases. Hepatobiliary: The 5.1 cm right lobe liver mass near the dome of the liver on 07/24/2017 measures 5.7 x 4.6 cm on image number 9 series 2 today. A more inferior right lobe liver mass seen on 06/03/2017, currently measures 6.8 x 5.3 cm on image number 23 series 2, previously 2.3 cm. The previously seen smaller masses in the left lobe of the liver are not clearly visualized today. Normal appearing gallbladder. Pancreas: Unremarkable. No pancreatic ductal dilatation or surrounding inflammatory changes. Spleen: Normal in size without focal abnormality. Adrenals/Urinary Tract: Previously demonstrated small left renal cysts and not changed significantly. Unremarkable right kidney and urinary bladder. The ureters are not well visualized. The previously demonstrated 1.4 cm left adrenal mass measures 1.9 x 1.5 cm on image number 15 series 2 and is mildly heterogeneous. Normal appearing right adrenal gland. Stomach/Bowel: Prominent stool in the colon. Anastomotic staples in the inferior pelvis in the midline. Unremarkable stomach and small bowel. No evidence of appendicitis. Vascular/Lymphatic: Extensive atheromatous arterial calcifications without aneurysm. No enlarged lymph nodes. Reproductive: Grossly stable probable uterine fibroid on the right. Other: Interval heterogeneously enhancing perianal mass, measuring 5.5 x 4.1 cm on image number 74 series 2. This measures 4.7 cm in length on coronal image number 52 series 5. Interval small amount of free peritoneal fluid. Musculoskeletal: Stable old L5 vertebral compression fracture with mild bony retropulsion. Mild lumbar and lower thoracic spine degenerative changes. No evidence of bony metastatic disease. IMPRESSION: 1. 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 2. Interval mild increase in size of the previously seen probable liver metastases in the right lobe of the liver. The  probable metastases in the left lobe of the liver are not clearly visible today. 3. Interval small amount of free peritoneal fluid. 4. Mild increase in size of a heterogeneous left adrenal mass, suspicious for a metastasis based on the CT appearance. On the MR, this had characteristics of a benign adenoma. 5. Prominent stool in the colon, probably due to obstruction of the anus by the large anal mass. Electronically Signed   By: Claudie Revering M.D.   On: 12/06/2017 15:41   Scheduled Meds: . budesonide  0.25 mg Nebulization BID  . cloNIDine  0.1 mg Oral Q8H  . feeding supplement (ENSURE ENLIVE)  237 mL Oral BID BM  . haloperidol  5 mg Oral QHS  . hydrALAZINE  50 mg Oral Q8H  . LORazepam  0.5 mg Oral TID   Continuous Infusions: . sodium chloride 75 mL/hr at 12/07/17 0700   Principal Problem:   Large bowel obstruction (HCC) Active Problems:   Schizophrenia (HCC)   Dementia (HCC)   Protein-calorie malnutrition, severe (HCC)   Diabetes ([place])   Essential hypertension   Rectal mass   Pressure injury of skin  Time spent:   [place]  Wynetta Emery, MD, FAAFP [facility] Pager [phone]  If 7PM-7AM, please contact night-coverage [email or web address] Password [email or web address] 12/07/2017, 10:14 AM    LOS: 1 day

## 2017-12-07 NOTE — Consult Note (Signed)
Reason for Consult: Metastatic carcinoma Referring Physician: Dr. Andrew Au is an 72 y.o. female.  HPI: Patient is a 72 year old black female who has been in hospice care due to metastatic carcinoma.  CT scan of the abdomen yesterday revealed a large rectal mass consistent with cancer.  She has multiple liver met metastases.  The patient was brought in by the family as she is not had a normal bowel movement for the past week.  There is no nausea or vomiting.  She is also having some purulent drainage from the anus.  I am unable to get a history from the patient due to schizophrenia and dementia.  Past Medical History:  Diagnosis Date  . Bipolar 1 disorder ([place])   . Cancer ([place])   . Dementia ([place])   . Hypertension   . Left adrenal mass ([place]) 09/06/2016  . Pneumothorax on right 09/07/2016  . Schizophrenia [facility])     Past Surgical History:  Procedure Laterality Date  . BOWEL RESECTION  12/23/2016   Procedure: SMALL BOWEL RESECTION;  Surgeon: Virl Cagey, MD;  Location: [facility];  Service: General;;  . Fatima Blank HERNIA REPAIR  01/04/2017   Procedure: Jerry Caras WITH MESH;  Surgeon: Aviva Signs, MD;  Location: [facility];  Service: General;;  . LAPAROTOMY N/A 12/23/2016   Procedure: EXPLORATORY LAPAROTOMY;  Surgeon: Virl Cagey, MD;  Location: [facility];  Service: General;  Laterality: N/A;  . ortho procedure     for broken legs or ankles daughter unsure  . RADIOLOGY WITH ANESTHESIA N/A 06/03/2017   Procedure: MRI WITH ANESTHESIA LIVER WITH AND WITHOUT CONTRAST;  Surgeon: Radiologist, Medication, MD;  Location: [place];  Service: Radiology;  Laterality: N/A;  . RADIOLOGY WITH ANESTHESIA N/A 07/24/2017   Procedure: CT WITH ANESTHESIA OF CHEST WITH CONTRAST,SOFT TISSUE OF NECK WITH CONTAST;  Surgeon: Radiologist, Medication, MD;  Location: [place];  Service: Radiology;  Laterality: N/A;    No family history on file.  Social History:  reports that she has quit  smoking. Her smoking use included cigarettes. She smoked 0.50 packs per day. She has never used smokeless tobacco. She reports that she does not drink alcohol or use drugs.  Allergies: No Known Allergies  Medications: I have reviewed the patient's current medications.  Results for orders placed or performed during the hospital encounter of 12/06/17 (from the past 48 hour(s))  CBC with Differential/Platelet     Status: Abnormal   Collection Time: 12/06/17 11:34 AM  Result Value Ref Range   WBC 11.1 (H) 4.0 - 10.5 K/uL   RBC 5.12 (H) 3.87 - 5.11 MIL/uL   Hemoglobin 13.5 12.0 - 15.0 g/dL   HCT 44.2 36.0 - 46.0 %   MCV 86.3 80.0 - 100.0 fL   MCH 26.4 26.0 - 34.0 pg   MCHC 30.5 30.0 - 36.0 g/dL   RDW 13.8 11.5 - 15.5 %   Platelets 306 150 - 400 K/uL   nRBC 0.0 0.0 - 0.2 %   Neutrophils Relative % 86 %   Neutro Abs 9.5 (H) 1.7 - 7.7 K/uL   Lymphocytes Relative 7 %   Lymphs Abs 0.8 0.7 - 4.0 K/uL   Monocytes Relative 6 %   Monocytes Absolute 0.6 0.1 - 1.0 K/uL   Eosinophils Relative 0 %   Eosinophils Absolute 0.0 0.0 - 0.5 K/uL   Basophils Relative 0 %   Basophils Absolute 0.0 0.0 - 0.1 K/uL   Immature Granulocytes 1 %  Abs Immature Granulocytes 0.06 0.00 - 0.07 K/uL    Comment: Performed at [facility] [address] [place], [place] [address]  Comprehensive metabolic panel     Status: Abnormal   Collection Time: 12/06/17 11:34 AM  Result Value Ref Range   Sodium 137 135 - 145 mmol/L   Potassium 4.0 3.5 - 5.1 mmol/L   Chloride 98 98 - 111 mmol/L   CO2 34 (H) 22 - 32 mmol/L   Glucose, Bld 116 (H) 70 - 99 mg/dL   BUN 9 8 - 23 mg/dL   Creatinine, Ser 0.51 0.44 - 1.00 mg/dL   Calcium 9.1 8.9 - 10.3 mg/dL   Total Protein 7.2 6.5 - 8.1 g/dL   Albumin 3.1 (L) 3.5 - 5.0 g/dL   AST 26 15 - 41 U/L   ALT 14 0 - 44 U/L   Alkaline Phosphatase 113 38 - 126 U/L   Total Bilirubin 0.6 0.3 - 1.2 mg/dL   GFR calc non Af Amer >60 >60 mL/min   GFR calc Af Amer >60 >60 mL/min    Comment:  (NOTE) The eGFR has been calculated using the CKD EPI equation. This calculation has not been validated in all clinical situations. eGFR's persistently <60 mL/min signify possible Chronic Kidney Disease.    Anion gap 5 5 - 15    Comment: Performed at [facility], [address] [place], [place] [address]    Ct Abdomen Pelvis W Contrast  Result Date: 12/06/2017 CLINICAL DATA:  Abdominal pain and bright red blood per rectum today. No bowel movement for the past 5 days. EXAM: CT ABDOMEN AND PELVIS WITH CONTRAST TECHNIQUE: Multidetector CT imaging of the abdomen and pelvis was performed using the standard protocol following bolus administration of intravenous contrast. CONTRAST:  52m ISOVUE-300 IOPAMIDOL (ISOVUE-300) INJECTION 61% COMPARISON:  Abdomen pelvis CT dated 04/17/2017 and liver MRI dated 06/03/2017. Chest CT dated 07/24/2017. FINDINGS: Lower chest: Clear lung bases. Hepatobiliary: The 5.1 cm right lobe liver mass near the dome of the liver on 07/24/2017 measures 5.7 x 4.6 cm on image number 9 series 2 today. A more inferior right lobe liver mass seen on 06/03/2017, currently measures 6.8 x 5.3 cm on image number 23 series 2, previously 2.3 cm. The previously seen smaller masses in the left lobe of the liver are not clearly visualized today. Normal appearing gallbladder. Pancreas: Unremarkable. No pancreatic ductal dilatation or surrounding inflammatory changes. Spleen: Normal in size without focal abnormality. Adrenals/Urinary Tract: Previously demonstrated small left renal cysts and not changed significantly. Unremarkable right kidney and urinary bladder. The ureters are not well visualized. The previously demonstrated 1.4 cm left adrenal mass measures 1.9 x 1.5 cm on image number 15 series 2 and is mildly heterogeneous. Normal appearing right adrenal gland. Stomach/Bowel: Prominent stool in the colon. Anastomotic staples in the inferior pelvis in the midline. Unremarkable stomach and small  bowel. No evidence of appendicitis. Vascular/Lymphatic: Extensive atheromatous arterial calcifications without aneurysm. No enlarged lymph nodes. Reproductive: Grossly stable probable uterine fibroid on the right. Other: Interval heterogeneously enhancing perianal mass, measuring 5.5 x 4.1 cm on image number 74 series 2. This measures 4.7 cm in length on coronal image number 52 series 5. Interval small amount of free peritoneal fluid. Musculoskeletal: Stable old L5 vertebral compression fracture with mild bony retropulsion. Mild lumbar and lower thoracic spine degenerative changes. No evidence of bony metastatic disease. IMPRESSION: 1. 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 2.  Interval mild increase in size of the previously seen probable liver metastases in the right lobe of the liver. The probable metastases in the left lobe of the liver are not clearly visible today. 3. Interval small amount of free peritoneal fluid. 4. Mild increase in size of a heterogeneous left adrenal mass, suspicious for a metastasis based on the CT appearance. On the MR, this had characteristics of a benign adenoma. 5. Prominent stool in the colon, probably due to obstruction of the anus by the large anal mass. Electronically Signed   By: Claudie Revering M.D.   On: 12/06/2017 15:41    ROS:  Pertinent items are noted in HPI.  Blood pressure (!) 192/80, pulse 82, temperature 98.6 F (37 C), temperature source Oral, resp. rate 18, height '5\' 6"'  (1.676 m), weight 37.9 kg, SpO2 92 %. Physical Exam: Cachectic black female in no acute distress.  She is very fidgety. Head is normocephalic, atraumatic Lungs clear to auscultation with equal breath sounds bilaterally Heart examination reveals a regular rate and rhythm without S3, S4, murmurs Abdomen is soft, flat, nontender.  She does have a palpable liver edge.  Bowel sounds present.  CT scan images personally  reviewed  Assessment/Plan: Impression: Near obstructing colon mass with a stool burden present in the colon, but she does not have significant dilatation or evidence of a small bowel obstruction. I had extensive discussion with the family member who takes care of her.  She states that she has not been throwing up it.  She has been taking liquids.  Is difficult to assess how much pain she is and given her schizophrenia, bipolar disorder, and dementia.  A diverting transverse loop colostomy is an option.  I did discuss this with the family.  I am concerned that given the patient's mental status, she may pick at the colostomy bag constantly.  I would like to try a full liquid diet and delay colostomy placement as the patient is not clinically having emesis.  Family is in agreement.  Aviva Signs 12/07/2017, 8:37 AM

## 2017-12-08 DIAGNOSIS — F0281 Dementia in other diseases classified elsewhere with behavioral disturbance: Secondary | ICD-10-CM

## 2017-12-08 DIAGNOSIS — E119 Type 2 diabetes mellitus without complications: Secondary | ICD-10-CM

## 2017-12-08 DIAGNOSIS — I10 Essential (primary) hypertension: Secondary | ICD-10-CM

## 2017-12-08 DIAGNOSIS — F209 Schizophrenia, unspecified: Secondary | ICD-10-CM

## 2017-12-08 MED ORDER — POLYETHYLENE GLYCOL 3350 17 G PO PACK: 17.0000 g | PACK | Freq: Every day | ORAL | Status: DC

## 2017-12-08 MED ORDER — POLYETHYLENE GLYCOL 3350 17 G PO PACK: 17.0000 g | PACK | Freq: Every day | ORAL | 0 refills | Status: AC

## 2017-12-08 NOTE — Discharge Summary (Addendum)
Physician Discharge Summary  Brittney Wall PNT:[id number] DOB: 21-Oct-1945 DOA: 12/06/2017  PCP: [name], MD  Admit date: 12/06/2017 Discharge date: 12/08/2017  Admitted From: Home Disposition: Return home with hospice services  Recommendations for Outpatient Follow-up:  1. Follow up with PCP in 1-2 weeks 2. Continue home care with hospice services  Discharge Condition: Hospice CODE STATUS: DNR Diet recommendation: Full liquids  Brief/Interim Summary: 72 year old female with a history of dementia, metastatic cancer of unknown primary, bipolar disorder, schizophrenia who is currently on hospice care at home.  Patient was brought to the hospital by patient's daughter who is her primary caregiver.  She reports that the patient had not had a bowel movement over the past week.  She has had abdominal distention and decreased p.o. intake.  CT scan of the abdomen pelvis revealed a large rectal mass consistent with cancer.  She was also known to have multiple liver mets.  Her colon mass was felt to be near obstructing, but she did not have significant dilatation or evidence of a small bowel obstruction.  The possibility of a diverting transverse loop colostomy was considered, but there was also concern that given the patient's mental status, she may pick at the colostomy bag constantly.  Since the patient is tolerating a full liquid diet and was able to pass her bowels in the hospital, recommendations were to not pursue surgery at this time.  Family has decided to return home with hospice services and focus on quality of life.  Discharge Diagnoses:  Principal Problem:   Large bowel obstruction ([place]) Active Problems:   Schizophrenia ([place])   Dementia ([facility])   Protein-calorie malnutrition, severe ([place])   Diabetes ([place])   Essential hypertension   Rectal mass   Pressure injury of skin, on right buttocks, stage II, present on admission  Discharge Instructions  Discharge Instructions    Diet  - low sodium heart healthy   Complete by:  As directed    Increase activity slowly   Complete by:  As directed      Allergies as of 12/08/2017   No Known Allergies     Medication List    TAKE these medications   acetaminophen 325 MG tablet Commonly known as:  TYLENOL Take 2 tablets (650 mg total) by mouth every 6 (six) hours as needed for mild pain or headache (or Fever >/= 101).   budesonide 0.25 MG/2ML nebulizer solution Commonly known as:  PULMICORT Take 2 mLs (0.25 mg total) by nebulization 2 (two) times daily.   cloNIDine 0.1 MG tablet Commonly known as:  CATAPRES Take 0.1 mg by mouth 3 (three) times daily.   haloperidol 5 MG tablet Commonly known as:  HALDOL Take 5 mg by mouth at bedtime.   hydrALAZINE 50 MG tablet Commonly known as:  APRESOLINE Take 1 tablet (50 mg total) by mouth every 8 (eight) hours.   ipratropium-albuterol 0.5-2.5 (3) MG/3ML Soln Commonly known as:  DUONEB Take 3 mLs by nebulization every 6 (six) hours as needed.   LORazepam 0.5 MG tablet Commonly known as:  ATIVAN Take 0.5 mg by mouth at bedtime as needed for sleep.   morphine 20 MG/5ML solution Take 1.3 mLs (5.2 mg total) by mouth every 4 (four) hours as needed for pain.   polyethylene glycol packet Commonly known as:  MIRALAX / GLYCOLAX Take 17 g by mouth daily.   traZODone 100 MG tablet Commonly known as:  DESYREL Take 100 mg by mouth at bedtime as needed for sleep.  No Known Allergies  Consultations:  General surgery   Procedures/Studies: Ct Abdomen Pelvis W Contrast  Result Date: 12/06/2017 CLINICAL DATA:  Abdominal pain and bright red blood per rectum today. No bowel movement for the past 5 days. EXAM: CT ABDOMEN AND PELVIS WITH CONTRAST TECHNIQUE: Multidetector CT imaging of the abdomen and pelvis was performed using the standard protocol following bolus administration of intravenous contrast. CONTRAST:  24mL ISOVUE-300 IOPAMIDOL (ISOVUE-300) INJECTION 61%  COMPARISON:  Abdomen pelvis CT dated 04/17/2017 and liver MRI dated 06/03/2017. Chest CT dated 07/24/2017. FINDINGS: Lower chest: Clear lung bases. Hepatobiliary: The 5.1 cm right lobe liver mass near the dome of the liver on 07/24/2017 measures 5.7 x 4.6 cm on image number 9 series 2 today. A more inferior right lobe liver mass seen on 06/03/2017, currently measures 6.8 x 5.3 cm on image number 23 series 2, previously 2.3 cm. The previously seen smaller masses in the left lobe of the liver are not clearly visualized today. Normal appearing gallbladder. Pancreas: Unremarkable. No pancreatic ductal dilatation or surrounding inflammatory changes. Spleen: Normal in size without focal abnormality. Adrenals/Urinary Tract: Previously demonstrated small left renal cysts and not changed significantly. Unremarkable right kidney and urinary bladder. The ureters are not well visualized. The previously demonstrated 1.4 cm left adrenal mass measures 1.9 x 1.5 cm on image number 15 series 2 and is mildly heterogeneous. Normal appearing right adrenal gland. Stomach/Bowel: Prominent stool in the colon. Anastomotic staples in the inferior pelvis in the midline. Unremarkable stomach and small bowel. No evidence of appendicitis. Vascular/Lymphatic: Extensive atheromatous arterial calcifications without aneurysm. No enlarged lymph nodes. Reproductive: Grossly stable probable uterine fibroid on the right. Other: Interval heterogeneously enhancing perianal mass, measuring 5.5 x 4.1 cm on image number 74 series 2. This measures 4.7 cm in length on coronal image number 52 series 5. Interval small amount of free peritoneal fluid. Musculoskeletal: Stable old L5 vertebral compression fracture with mild bony retropulsion. Mild lumbar and lower thoracic spine degenerative changes. No evidence of bony metastatic disease. IMPRESSION: 1. 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 2. Interval mild  increase in size of the previously seen probable liver metastases in the right lobe of the liver. The probable metastases in the left lobe of the liver are not clearly visible today. 3. Interval small amount of free peritoneal fluid. 4. Mild increase in size of a heterogeneous left adrenal mass, suspicious for a metastasis based on the CT appearance. On the MR, this had characteristics of a benign adenoma. 5. Prominent stool in the colon, probably due to obstruction of the anus by the large anal mass. Electronically Signed   By: Claudie Revering M.D.   On: 12/06/2017 15:41       Subjective: Staff reports that she had a bowel movement yesterday.  She is not vomiting and tolerating full liquids.  Discharge Exam: Vitals:   12/07/17 2226 12/08/17 0650 12/08/17 0651 12/08/17 0736  BP: (!) 233/94  (!) 186/108   Pulse: 78  78   Resp: 16     Temp: 98.1 F (36.7 C) (!) 97.4 F (36.3 C)    TempSrc: Oral Oral    SpO2: 100%  98% 97%  Weight:      Height:        General: Pt is awake, not in acute distress Cardiovascular: RRR, S1/S2 +, no rubs, no gallops Respiratory: CTA bilaterally, no wheezing, no rhonchi Abdominal: Soft, NT, ND, bowel sounds + Extremities:  no edema, no cyanosis    The results of significant diagnostics from this hospitalization (including imaging, microbiology, ancillary and laboratory) are listed below for reference.     Microbiology: No results found for this or any previous visit (from the past 240 hour(s)).   Labs: BNP (last 3 results) No results for input(s): BNP in the last 8760 hours. Basic Metabolic Panel: Recent Labs  Lab 12/06/17 1134  NA 137  K 4.0  CL 98  CO2 34*  GLUCOSE 116*  BUN 9  CREATININE 0.51  CALCIUM 9.1   Liver Function Tests: Recent Labs  Lab 12/06/17 1134  AST 26  ALT 14  ALKPHOS 113  BILITOT 0.6  PROT 7.2  ALBUMIN 3.1*   No results for input(s): LIPASE, AMYLASE in the last 168 hours. No results for input(s): AMMONIA in the  last 168 hours. CBC: Recent Labs  Lab 12/06/17 1134  WBC 11.1*  NEUTROABS 9.5*  HGB 13.5  HCT 44.2  MCV 86.3  PLT 306   Cardiac Enzymes: No results for input(s): CKTOTAL, CKMB, CKMBINDEX, TROPONINI in the last 168 hours. BNP: Invalid input(s): POCBNP CBG: No results for input(s): GLUCAP in the last 168 hours. D-Dimer No results for input(s): DDIMER in the last 72 hours. Hgb A1c No results for input(s): HGBA1C in the last 72 hours. Lipid Profile No results for input(s): CHOL, HDL, LDLCALC, TRIG, CHOLHDL, LDLDIRECT in the last 72 hours. Thyroid function studies No results for input(s): TSH, T4TOTAL, T3FREE, THYROIDAB in the last 72 hours.  Invalid input(s): FREET3 Anemia work up No results for input(s): VITAMINB12, FOLATE, FERRITIN, TIBC, IRON, RETICCTPCT in the last 72 hours. Urinalysis    Component Value Date/Time   COLORURINE YELLOW 08/29/2017 0057   APPEARANCEUR CLOUDY (A) 08/29/2017 0057   LABSPEC 1.013 08/29/2017 0057   PHURINE 6.0 08/29/2017 0057   GLUCOSEU >=500 (A) 08/29/2017 0057   HGBUR MODERATE (A) 08/29/2017 0057   BILIRUBINUR NEGATIVE 08/29/2017 0057   KETONESUR NEGATIVE 08/29/2017 0057   PROTEINUR 100 (A) 08/29/2017 0057   UROBILINOGEN 0.2 03/12/2013 1350   NITRITE NEGATIVE 08/29/2017 0057   LEUKOCYTESUR NEGATIVE 08/29/2017 0057   Sepsis Labs Invalid input(s): PROCALCITONIN,  WBC,  LACTICIDVEN Microbiology No results found for this or any previous visit (from the past 240 hour(s)).   Time coordinating discharge: 46mins  SIGNED:   Kathie Dike, MD  [facility] 12/08/2017, 7:44 PM Pager   If 7PM-7AM, please contact night-coverage [email or web address] Password [email or web address]

## 2017-12-08 NOTE — Care Management Important Message (Signed)
Important Message  Patient Details  Name: Brittney Wall MRN: [id number] Date of Birth: 1946/01/06   Medicare Important Message Given:  Yes    Shelda Altes 12/08/2017, 9:37 AM

## 2017-12-08 NOTE — Progress Notes (Signed)
Subjective: Patient resting comfortably.  No reported nausea or vomiting  Objective: Vital signs in last 24 hours: Temp:  [97.4 F (36.3 C)-98.2 F (36.8 C)] 97.4 F (36.3 C) (10/28 0650) Pulse Rate:  [47-84] 78 (10/28 0651) Resp:  [16-19] 16 (10/27 2226) BP: (186-233)/(84-108) 186/108 (10/28 0651) SpO2:  [95 %-100 %] 97 % (10/28 0736) Last BM Date: 12/07/17  Intake/Output from previous day: 10/27 0701 - 10/28 0700 In: 1870 [P.O.:1870] Out: -  Intake/Output this shift: Total I/O In: 120 [P.O.:120] Out: -   General appearance: no distress GI: soft, non-tender; bowel sounds normal; no masses,  no organomegaly  Lab Results:  Recent Labs    12/06/17 1134  WBC 11.1*  HGB 13.5  HCT 44.2  PLT 306   BMET Recent Labs    12/06/17 1134  NA 137  K 4.0  CL 98  CO2 34*  GLUCOSE 116*  BUN 9  CREATININE 0.51  CALCIUM 9.1   PT/INR No results for input(s): LABPROT, INR in the last 72 hours.  Studies/Results: Ct Abdomen Pelvis W Contrast  Result Date: 12/06/2017 CLINICAL DATA:  Abdominal pain and bright red blood per rectum today. No bowel movement for the past 5 days. EXAM: CT ABDOMEN AND PELVIS WITH CONTRAST TECHNIQUE: Multidetector CT imaging of the abdomen and pelvis was performed using the standard protocol following bolus administration of intravenous contrast. CONTRAST:  53mL ISOVUE-300 IOPAMIDOL (ISOVUE-300) INJECTION 61% COMPARISON:  Abdomen pelvis CT dated 04/17/2017 and liver MRI dated 06/03/2017. Chest CT dated 07/24/2017. FINDINGS: Lower chest: Clear lung bases. Hepatobiliary: The 5.1 cm right lobe liver mass near the dome of the liver on 07/24/2017 measures 5.7 x 4.6 cm on image number 9 series 2 today. A more inferior right lobe liver mass seen on 06/03/2017, currently measures 6.8 x 5.3 cm on image number 23 series 2, previously 2.3 cm. The previously seen smaller masses in the left lobe of the liver are not clearly visualized today. Normal appearing  gallbladder. Pancreas: Unremarkable. No pancreatic ductal dilatation or surrounding inflammatory changes. Spleen: Normal in size without focal abnormality. Adrenals/Urinary Tract: Previously demonstrated small left renal cysts and not changed significantly. Unremarkable right kidney and urinary bladder. The ureters are not well visualized. The previously demonstrated 1.4 cm left adrenal mass measures 1.9 x 1.5 cm on image number 15 series 2 and is mildly heterogeneous. Normal appearing right adrenal gland. Stomach/Bowel: Prominent stool in the colon. Anastomotic staples in the inferior pelvis in the midline. Unremarkable stomach and small bowel. No evidence of appendicitis. Vascular/Lymphatic: Extensive atheromatous arterial calcifications without aneurysm. No enlarged lymph nodes. Reproductive: Grossly stable probable uterine fibroid on the right. Other: Interval heterogeneously enhancing perianal mass, measuring 5.5 x 4.1 cm on image number 74 series 2. This measures 4.7 cm in length on coronal image number 52 series 5. Interval small amount of free peritoneal fluid. Musculoskeletal: Stable old L5 vertebral compression fracture with mild bony retropulsion. Mild lumbar and lower thoracic spine degenerative changes. No evidence of bony metastatic disease. IMPRESSION: 1. 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 2. Interval mild increase in size of the previously seen probable liver metastases in the right lobe of the liver. The probable metastases in the left lobe of the liver are not clearly visible today. 3. Interval small amount of free peritoneal fluid. 4. Mild increase in size of a heterogeneous left adrenal mass, suspicious for a metastasis based on the CT appearance. On the MR, this  had characteristics of a benign adenoma. 5. Prominent stool in the colon, probably due to obstruction of the anus by the large anal mass. Electronically Signed   By: Claudie Revering M.D.    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  Clinically no evidence of complete obstruction. Plan: Would send back to hospice care on full liquid diet.  No need for loop colostomy at this time.  LOS: 2 days    Aviva Signs 12/08/2017

## 2017-12-08 NOTE — Care Management Note (Signed)
Case Management Note  Patient Details  Name: WYLMA TATEM MRN: [id number] Date of Birth: 1945/02/20  Subjective/Objective:       Admitted with bowel obstructions. From home with family, active with hospice services in the home through [facility].              Action/Plan: DC home today with continuation of hospice services. CM has verified with daughter [name] that they are happy with hospice services, they need no changes or DME at DC. Daughter plans to transport pt home via car. CM has notified Hospice rep, Ron, of DC planned for today. Will rout DC summary once available.   Expected Discharge Date:  12/08/17               Expected Discharge Plan:  Home w Hospice Care  In-House Referral:  NA  Discharge planning Services  CM Consult  Post Acute Care Choice:  Resumption of Svcs/PTA Provider Choice offered to:  Adult Children  Status of Service:  Completed, signed off  Sherald Barge, RN 12/08/2017, 10:41 AM

## 2017-12-09 DIAGNOSIS — F319 Bipolar disorder, unspecified: Secondary | ICD-10-CM | POA: Diagnosis not present

## 2017-12-09 DIAGNOSIS — F209 Schizophrenia, unspecified: Secondary | ICD-10-CM | POA: Diagnosis not present

## 2017-12-09 DIAGNOSIS — I10 Essential (primary) hypertension: Secondary | ICD-10-CM | POA: Diagnosis not present

## 2017-12-09 DIAGNOSIS — J449 Chronic obstructive pulmonary disease, unspecified: Secondary | ICD-10-CM | POA: Diagnosis not present

## 2017-12-09 DIAGNOSIS — E118 Type 2 diabetes mellitus with unspecified complications: Secondary | ICD-10-CM | POA: Diagnosis not present

## 2017-12-09 DIAGNOSIS — E43 Unspecified severe protein-calorie malnutrition: Secondary | ICD-10-CM | POA: Diagnosis not present

## 2017-12-12 DIAGNOSIS — E118 Type 2 diabetes mellitus with unspecified complications: Secondary | ICD-10-CM | POA: Diagnosis not present

## 2017-12-12 DIAGNOSIS — R531 Weakness: Secondary | ICD-10-CM | POA: Diagnosis not present

## 2017-12-12 DIAGNOSIS — E43 Unspecified severe protein-calorie malnutrition: Secondary | ICD-10-CM | POA: Diagnosis not present

## 2017-12-12 DIAGNOSIS — F209 Schizophrenia, unspecified: Secondary | ICD-10-CM | POA: Diagnosis not present

## 2017-12-12 DIAGNOSIS — J449 Chronic obstructive pulmonary disease, unspecified: Secondary | ICD-10-CM | POA: Diagnosis not present

## 2017-12-12 DIAGNOSIS — R0602 Shortness of breath: Secondary | ICD-10-CM | POA: Diagnosis not present

## 2017-12-12 DIAGNOSIS — I10 Essential (primary) hypertension: Secondary | ICD-10-CM | POA: Diagnosis not present

## 2017-12-12 DIAGNOSIS — F319 Bipolar disorder, unspecified: Secondary | ICD-10-CM | POA: Diagnosis not present

## 2017-12-16 DIAGNOSIS — I10 Essential (primary) hypertension: Secondary | ICD-10-CM | POA: Diagnosis not present

## 2017-12-16 DIAGNOSIS — F319 Bipolar disorder, unspecified: Secondary | ICD-10-CM | POA: Diagnosis not present

## 2017-12-16 DIAGNOSIS — J449 Chronic obstructive pulmonary disease, unspecified: Secondary | ICD-10-CM | POA: Diagnosis not present

## 2017-12-16 DIAGNOSIS — E43 Unspecified severe protein-calorie malnutrition: Secondary | ICD-10-CM | POA: Diagnosis not present

## 2017-12-16 DIAGNOSIS — F209 Schizophrenia, unspecified: Secondary | ICD-10-CM | POA: Diagnosis not present

## 2017-12-16 DIAGNOSIS — E118 Type 2 diabetes mellitus with unspecified complications: Secondary | ICD-10-CM | POA: Diagnosis not present

## 2017-12-17 DIAGNOSIS — F209 Schizophrenia, unspecified: Secondary | ICD-10-CM | POA: Diagnosis not present

## 2017-12-17 DIAGNOSIS — E118 Type 2 diabetes mellitus with unspecified complications: Secondary | ICD-10-CM | POA: Diagnosis not present

## 2017-12-17 DIAGNOSIS — J449 Chronic obstructive pulmonary disease, unspecified: Secondary | ICD-10-CM | POA: Diagnosis not present

## 2017-12-17 DIAGNOSIS — I10 Essential (primary) hypertension: Secondary | ICD-10-CM | POA: Diagnosis not present

## 2017-12-17 DIAGNOSIS — E43 Unspecified severe protein-calorie malnutrition: Secondary | ICD-10-CM | POA: Diagnosis not present

## 2017-12-17 DIAGNOSIS — F319 Bipolar disorder, unspecified: Secondary | ICD-10-CM | POA: Diagnosis not present

## 2017-12-19 DIAGNOSIS — I10 Essential (primary) hypertension: Secondary | ICD-10-CM | POA: Diagnosis not present

## 2017-12-19 DIAGNOSIS — F209 Schizophrenia, unspecified: Secondary | ICD-10-CM | POA: Diagnosis not present

## 2017-12-19 DIAGNOSIS — J449 Chronic obstructive pulmonary disease, unspecified: Secondary | ICD-10-CM | POA: Diagnosis not present

## 2017-12-19 DIAGNOSIS — E118 Type 2 diabetes mellitus with unspecified complications: Secondary | ICD-10-CM | POA: Diagnosis not present

## 2017-12-19 DIAGNOSIS — E43 Unspecified severe protein-calorie malnutrition: Secondary | ICD-10-CM | POA: Diagnosis not present

## 2017-12-19 DIAGNOSIS — F319 Bipolar disorder, unspecified: Secondary | ICD-10-CM | POA: Diagnosis not present

## 2017-12-22 DIAGNOSIS — J449 Chronic obstructive pulmonary disease, unspecified: Secondary | ICD-10-CM | POA: Diagnosis not present

## 2017-12-22 DIAGNOSIS — E118 Type 2 diabetes mellitus with unspecified complications: Secondary | ICD-10-CM | POA: Diagnosis not present

## 2017-12-22 DIAGNOSIS — F209 Schizophrenia, unspecified: Secondary | ICD-10-CM | POA: Diagnosis not present

## 2017-12-22 DIAGNOSIS — F319 Bipolar disorder, unspecified: Secondary | ICD-10-CM | POA: Diagnosis not present

## 2017-12-22 DIAGNOSIS — I10 Essential (primary) hypertension: Secondary | ICD-10-CM | POA: Diagnosis not present

## 2017-12-22 DIAGNOSIS — E43 Unspecified severe protein-calorie malnutrition: Secondary | ICD-10-CM | POA: Diagnosis not present

## 2017-12-24 DIAGNOSIS — F209 Schizophrenia, unspecified: Secondary | ICD-10-CM | POA: Diagnosis not present

## 2017-12-24 DIAGNOSIS — J449 Chronic obstructive pulmonary disease, unspecified: Secondary | ICD-10-CM | POA: Diagnosis not present

## 2017-12-24 DIAGNOSIS — F319 Bipolar disorder, unspecified: Secondary | ICD-10-CM | POA: Diagnosis not present

## 2017-12-24 DIAGNOSIS — E118 Type 2 diabetes mellitus with unspecified complications: Secondary | ICD-10-CM | POA: Diagnosis not present

## 2017-12-24 DIAGNOSIS — I10 Essential (primary) hypertension: Secondary | ICD-10-CM | POA: Diagnosis not present

## 2017-12-24 DIAGNOSIS — E43 Unspecified severe protein-calorie malnutrition: Secondary | ICD-10-CM | POA: Diagnosis not present

## 2017-12-26 DIAGNOSIS — F319 Bipolar disorder, unspecified: Secondary | ICD-10-CM | POA: Diagnosis not present

## 2017-12-26 DIAGNOSIS — F209 Schizophrenia, unspecified: Secondary | ICD-10-CM | POA: Diagnosis not present

## 2017-12-26 DIAGNOSIS — I10 Essential (primary) hypertension: Secondary | ICD-10-CM | POA: Diagnosis not present

## 2017-12-26 DIAGNOSIS — E43 Unspecified severe protein-calorie malnutrition: Secondary | ICD-10-CM | POA: Diagnosis not present

## 2017-12-26 DIAGNOSIS — J449 Chronic obstructive pulmonary disease, unspecified: Secondary | ICD-10-CM | POA: Diagnosis not present

## 2017-12-26 DIAGNOSIS — E118 Type 2 diabetes mellitus with unspecified complications: Secondary | ICD-10-CM | POA: Diagnosis not present

## 2017-12-30 DIAGNOSIS — E118 Type 2 diabetes mellitus with unspecified complications: Secondary | ICD-10-CM | POA: Diagnosis not present

## 2017-12-30 DIAGNOSIS — J449 Chronic obstructive pulmonary disease, unspecified: Secondary | ICD-10-CM | POA: Diagnosis not present

## 2017-12-30 DIAGNOSIS — F209 Schizophrenia, unspecified: Secondary | ICD-10-CM | POA: Diagnosis not present

## 2017-12-30 DIAGNOSIS — I10 Essential (primary) hypertension: Secondary | ICD-10-CM | POA: Diagnosis not present

## 2017-12-30 DIAGNOSIS — E43 Unspecified severe protein-calorie malnutrition: Secondary | ICD-10-CM | POA: Diagnosis not present

## 2017-12-30 DIAGNOSIS — F319 Bipolar disorder, unspecified: Secondary | ICD-10-CM | POA: Diagnosis not present

## 2018-01-02 DIAGNOSIS — J449 Chronic obstructive pulmonary disease, unspecified: Secondary | ICD-10-CM | POA: Diagnosis not present

## 2018-01-02 DIAGNOSIS — I10 Essential (primary) hypertension: Secondary | ICD-10-CM | POA: Diagnosis not present

## 2018-01-02 DIAGNOSIS — F209 Schizophrenia, unspecified: Secondary | ICD-10-CM | POA: Diagnosis not present

## 2018-01-02 DIAGNOSIS — F319 Bipolar disorder, unspecified: Secondary | ICD-10-CM | POA: Diagnosis not present

## 2018-01-02 DIAGNOSIS — E118 Type 2 diabetes mellitus with unspecified complications: Secondary | ICD-10-CM | POA: Diagnosis not present

## 2018-01-02 DIAGNOSIS — E43 Unspecified severe protein-calorie malnutrition: Secondary | ICD-10-CM | POA: Diagnosis not present

## 2018-01-06 DIAGNOSIS — E43 Unspecified severe protein-calorie malnutrition: Secondary | ICD-10-CM | POA: Diagnosis not present

## 2018-01-06 DIAGNOSIS — E118 Type 2 diabetes mellitus with unspecified complications: Secondary | ICD-10-CM | POA: Diagnosis not present

## 2018-01-06 DIAGNOSIS — J449 Chronic obstructive pulmonary disease, unspecified: Secondary | ICD-10-CM | POA: Diagnosis not present

## 2018-01-06 DIAGNOSIS — F209 Schizophrenia, unspecified: Secondary | ICD-10-CM | POA: Diagnosis not present

## 2018-01-06 DIAGNOSIS — I10 Essential (primary) hypertension: Secondary | ICD-10-CM | POA: Diagnosis not present

## 2018-01-06 DIAGNOSIS — F319 Bipolar disorder, unspecified: Secondary | ICD-10-CM | POA: Diagnosis not present

## 2018-01-11 DIAGNOSIS — J449 Chronic obstructive pulmonary disease, unspecified: Secondary | ICD-10-CM | POA: Diagnosis not present

## 2018-01-11 DIAGNOSIS — I10 Essential (primary) hypertension: Secondary | ICD-10-CM | POA: Diagnosis not present

## 2018-01-11 DIAGNOSIS — R0602 Shortness of breath: Secondary | ICD-10-CM | POA: Diagnosis not present

## 2018-01-11 DIAGNOSIS — E118 Type 2 diabetes mellitus with unspecified complications: Secondary | ICD-10-CM | POA: Diagnosis not present

## 2018-01-11 DIAGNOSIS — F319 Bipolar disorder, unspecified: Secondary | ICD-10-CM | POA: Diagnosis not present

## 2018-01-11 DIAGNOSIS — R531 Weakness: Secondary | ICD-10-CM | POA: Diagnosis not present

## 2018-01-11 DIAGNOSIS — F209 Schizophrenia, unspecified: Secondary | ICD-10-CM | POA: Diagnosis not present

## 2018-01-11 DIAGNOSIS — E43 Unspecified severe protein-calorie malnutrition: Secondary | ICD-10-CM | POA: Diagnosis not present

## 2018-01-13 DIAGNOSIS — F319 Bipolar disorder, unspecified: Secondary | ICD-10-CM | POA: Diagnosis not present

## 2018-01-13 DIAGNOSIS — E43 Unspecified severe protein-calorie malnutrition: Secondary | ICD-10-CM | POA: Diagnosis not present

## 2018-01-13 DIAGNOSIS — F209 Schizophrenia, unspecified: Secondary | ICD-10-CM | POA: Diagnosis not present

## 2018-01-13 DIAGNOSIS — J449 Chronic obstructive pulmonary disease, unspecified: Secondary | ICD-10-CM | POA: Diagnosis not present

## 2018-01-13 DIAGNOSIS — E118 Type 2 diabetes mellitus with unspecified complications: Secondary | ICD-10-CM | POA: Diagnosis not present

## 2018-01-13 DIAGNOSIS — I10 Essential (primary) hypertension: Secondary | ICD-10-CM | POA: Diagnosis not present

## 2018-01-15 DIAGNOSIS — F319 Bipolar disorder, unspecified: Secondary | ICD-10-CM | POA: Diagnosis not present

## 2018-01-15 DIAGNOSIS — F209 Schizophrenia, unspecified: Secondary | ICD-10-CM | POA: Diagnosis not present

## 2018-01-15 DIAGNOSIS — I10 Essential (primary) hypertension: Secondary | ICD-10-CM | POA: Diagnosis not present

## 2018-01-15 DIAGNOSIS — J449 Chronic obstructive pulmonary disease, unspecified: Secondary | ICD-10-CM | POA: Diagnosis not present

## 2018-01-15 DIAGNOSIS — E43 Unspecified severe protein-calorie malnutrition: Secondary | ICD-10-CM | POA: Diagnosis not present

## 2018-01-15 DIAGNOSIS — E118 Type 2 diabetes mellitus with unspecified complications: Secondary | ICD-10-CM | POA: Diagnosis not present

## 2018-01-19 DIAGNOSIS — F319 Bipolar disorder, unspecified: Secondary | ICD-10-CM | POA: Diagnosis not present

## 2018-01-19 DIAGNOSIS — F209 Schizophrenia, unspecified: Secondary | ICD-10-CM | POA: Diagnosis not present

## 2018-01-19 DIAGNOSIS — J449 Chronic obstructive pulmonary disease, unspecified: Secondary | ICD-10-CM | POA: Diagnosis not present

## 2018-01-19 DIAGNOSIS — E118 Type 2 diabetes mellitus with unspecified complications: Secondary | ICD-10-CM | POA: Diagnosis not present

## 2018-01-19 DIAGNOSIS — I10 Essential (primary) hypertension: Secondary | ICD-10-CM | POA: Diagnosis not present

## 2018-01-19 DIAGNOSIS — E43 Unspecified severe protein-calorie malnutrition: Secondary | ICD-10-CM | POA: Diagnosis not present

## 2018-01-20 DIAGNOSIS — E118 Type 2 diabetes mellitus with unspecified complications: Secondary | ICD-10-CM | POA: Diagnosis not present

## 2018-01-20 DIAGNOSIS — E43 Unspecified severe protein-calorie malnutrition: Secondary | ICD-10-CM | POA: Diagnosis not present

## 2018-01-20 DIAGNOSIS — I10 Essential (primary) hypertension: Secondary | ICD-10-CM | POA: Diagnosis not present

## 2018-01-20 DIAGNOSIS — F209 Schizophrenia, unspecified: Secondary | ICD-10-CM | POA: Diagnosis not present

## 2018-01-20 DIAGNOSIS — J449 Chronic obstructive pulmonary disease, unspecified: Secondary | ICD-10-CM | POA: Diagnosis not present

## 2018-01-20 DIAGNOSIS — F319 Bipolar disorder, unspecified: Secondary | ICD-10-CM | POA: Diagnosis not present

## 2018-01-27 DIAGNOSIS — I10 Essential (primary) hypertension: Secondary | ICD-10-CM | POA: Diagnosis not present

## 2018-01-27 DIAGNOSIS — E43 Unspecified severe protein-calorie malnutrition: Secondary | ICD-10-CM | POA: Diagnosis not present

## 2018-01-27 DIAGNOSIS — E118 Type 2 diabetes mellitus with unspecified complications: Secondary | ICD-10-CM | POA: Diagnosis not present

## 2018-01-27 DIAGNOSIS — F319 Bipolar disorder, unspecified: Secondary | ICD-10-CM | POA: Diagnosis not present

## 2018-01-27 DIAGNOSIS — F209 Schizophrenia, unspecified: Secondary | ICD-10-CM | POA: Diagnosis not present

## 2018-01-27 DIAGNOSIS — J449 Chronic obstructive pulmonary disease, unspecified: Secondary | ICD-10-CM | POA: Diagnosis not present

## 2018-01-28 DIAGNOSIS — E118 Type 2 diabetes mellitus with unspecified complications: Secondary | ICD-10-CM | POA: Diagnosis not present

## 2018-01-28 DIAGNOSIS — I10 Essential (primary) hypertension: Secondary | ICD-10-CM | POA: Diagnosis not present

## 2018-01-28 DIAGNOSIS — F209 Schizophrenia, unspecified: Secondary | ICD-10-CM | POA: Diagnosis not present

## 2018-01-28 DIAGNOSIS — F319 Bipolar disorder, unspecified: Secondary | ICD-10-CM | POA: Diagnosis not present

## 2018-01-28 DIAGNOSIS — E43 Unspecified severe protein-calorie malnutrition: Secondary | ICD-10-CM | POA: Diagnosis not present

## 2018-01-28 DIAGNOSIS — J449 Chronic obstructive pulmonary disease, unspecified: Secondary | ICD-10-CM | POA: Diagnosis not present

## 2018-01-29 DIAGNOSIS — I10 Essential (primary) hypertension: Secondary | ICD-10-CM | POA: Diagnosis not present

## 2018-01-29 DIAGNOSIS — J449 Chronic obstructive pulmonary disease, unspecified: Secondary | ICD-10-CM | POA: Diagnosis not present

## 2018-01-29 DIAGNOSIS — E118 Type 2 diabetes mellitus with unspecified complications: Secondary | ICD-10-CM | POA: Diagnosis not present

## 2018-01-29 DIAGNOSIS — E43 Unspecified severe protein-calorie malnutrition: Secondary | ICD-10-CM | POA: Diagnosis not present

## 2018-01-29 DIAGNOSIS — F209 Schizophrenia, unspecified: Secondary | ICD-10-CM | POA: Diagnosis not present

## 2018-01-29 DIAGNOSIS — F319 Bipolar disorder, unspecified: Secondary | ICD-10-CM | POA: Diagnosis not present

## 2018-01-30 DIAGNOSIS — F319 Bipolar disorder, unspecified: Secondary | ICD-10-CM | POA: Diagnosis not present

## 2018-01-30 DIAGNOSIS — E43 Unspecified severe protein-calorie malnutrition: Secondary | ICD-10-CM | POA: Diagnosis not present

## 2018-01-30 DIAGNOSIS — E118 Type 2 diabetes mellitus with unspecified complications: Secondary | ICD-10-CM | POA: Diagnosis not present

## 2018-01-30 DIAGNOSIS — J449 Chronic obstructive pulmonary disease, unspecified: Secondary | ICD-10-CM | POA: Diagnosis not present

## 2018-01-30 DIAGNOSIS — I10 Essential (primary) hypertension: Secondary | ICD-10-CM | POA: Diagnosis not present

## 2018-01-30 DIAGNOSIS — F209 Schizophrenia, unspecified: Secondary | ICD-10-CM | POA: Diagnosis not present

## 2018-02-03 DIAGNOSIS — J449 Chronic obstructive pulmonary disease, unspecified: Secondary | ICD-10-CM | POA: Diagnosis not present

## 2018-02-03 DIAGNOSIS — I10 Essential (primary) hypertension: Secondary | ICD-10-CM | POA: Diagnosis not present

## 2018-02-03 DIAGNOSIS — F319 Bipolar disorder, unspecified: Secondary | ICD-10-CM | POA: Diagnosis not present

## 2018-02-03 DIAGNOSIS — F209 Schizophrenia, unspecified: Secondary | ICD-10-CM | POA: Diagnosis not present

## 2018-02-03 DIAGNOSIS — E118 Type 2 diabetes mellitus with unspecified complications: Secondary | ICD-10-CM | POA: Diagnosis not present

## 2018-02-03 DIAGNOSIS — E43 Unspecified severe protein-calorie malnutrition: Secondary | ICD-10-CM | POA: Diagnosis not present

## 2018-02-05 DIAGNOSIS — I10 Essential (primary) hypertension: Secondary | ICD-10-CM | POA: Diagnosis not present

## 2018-02-05 DIAGNOSIS — J449 Chronic obstructive pulmonary disease, unspecified: Secondary | ICD-10-CM | POA: Diagnosis not present

## 2018-02-05 DIAGNOSIS — E118 Type 2 diabetes mellitus with unspecified complications: Secondary | ICD-10-CM | POA: Diagnosis not present

## 2018-02-05 DIAGNOSIS — F319 Bipolar disorder, unspecified: Secondary | ICD-10-CM | POA: Diagnosis not present

## 2018-02-05 DIAGNOSIS — F209 Schizophrenia, unspecified: Secondary | ICD-10-CM | POA: Diagnosis not present

## 2018-02-05 DIAGNOSIS — E43 Unspecified severe protein-calorie malnutrition: Secondary | ICD-10-CM | POA: Diagnosis not present

## 2018-02-06 DIAGNOSIS — I10 Essential (primary) hypertension: Secondary | ICD-10-CM | POA: Diagnosis not present

## 2018-02-06 DIAGNOSIS — E118 Type 2 diabetes mellitus with unspecified complications: Secondary | ICD-10-CM | POA: Diagnosis not present

## 2018-02-06 DIAGNOSIS — F319 Bipolar disorder, unspecified: Secondary | ICD-10-CM | POA: Diagnosis not present

## 2018-02-06 DIAGNOSIS — J449 Chronic obstructive pulmonary disease, unspecified: Secondary | ICD-10-CM | POA: Diagnosis not present

## 2018-02-06 DIAGNOSIS — F209 Schizophrenia, unspecified: Secondary | ICD-10-CM | POA: Diagnosis not present

## 2018-02-06 DIAGNOSIS — E43 Unspecified severe protein-calorie malnutrition: Secondary | ICD-10-CM | POA: Diagnosis not present

## 2018-02-08 DIAGNOSIS — E43 Unspecified severe protein-calorie malnutrition: Secondary | ICD-10-CM | POA: Diagnosis not present

## 2018-02-08 DIAGNOSIS — F319 Bipolar disorder, unspecified: Secondary | ICD-10-CM | POA: Diagnosis not present

## 2018-02-08 DIAGNOSIS — J449 Chronic obstructive pulmonary disease, unspecified: Secondary | ICD-10-CM | POA: Diagnosis not present

## 2018-02-08 DIAGNOSIS — I10 Essential (primary) hypertension: Secondary | ICD-10-CM | POA: Diagnosis not present

## 2018-02-08 DIAGNOSIS — F209 Schizophrenia, unspecified: Secondary | ICD-10-CM | POA: Diagnosis not present

## 2018-02-08 DIAGNOSIS — E118 Type 2 diabetes mellitus with unspecified complications: Secondary | ICD-10-CM | POA: Diagnosis not present

## 2018-02-09 DIAGNOSIS — E43 Unspecified severe protein-calorie malnutrition: Secondary | ICD-10-CM | POA: Diagnosis not present

## 2018-02-09 DIAGNOSIS — F209 Schizophrenia, unspecified: Secondary | ICD-10-CM | POA: Diagnosis not present

## 2018-02-09 DIAGNOSIS — I10 Essential (primary) hypertension: Secondary | ICD-10-CM | POA: Diagnosis not present

## 2018-02-09 DIAGNOSIS — J449 Chronic obstructive pulmonary disease, unspecified: Secondary | ICD-10-CM | POA: Diagnosis not present

## 2018-02-09 DIAGNOSIS — E118 Type 2 diabetes mellitus with unspecified complications: Secondary | ICD-10-CM | POA: Diagnosis not present

## 2018-02-09 DIAGNOSIS — F319 Bipolar disorder, unspecified: Secondary | ICD-10-CM | POA: Diagnosis not present

## 2018-02-10 DIAGNOSIS — I10 Essential (primary) hypertension: Secondary | ICD-10-CM | POA: Diagnosis not present

## 2018-02-10 DIAGNOSIS — E118 Type 2 diabetes mellitus with unspecified complications: Secondary | ICD-10-CM | POA: Diagnosis not present

## 2018-02-10 DIAGNOSIS — E43 Unspecified severe protein-calorie malnutrition: Secondary | ICD-10-CM | POA: Diagnosis not present

## 2018-02-10 DIAGNOSIS — F319 Bipolar disorder, unspecified: Secondary | ICD-10-CM | POA: Diagnosis not present

## 2018-02-10 DIAGNOSIS — J449 Chronic obstructive pulmonary disease, unspecified: Secondary | ICD-10-CM | POA: Diagnosis not present

## 2018-02-10 DIAGNOSIS — F209 Schizophrenia, unspecified: Secondary | ICD-10-CM | POA: Diagnosis not present

## 2018-02-11 DEATH — deceased

## 2018-10-03 IMAGING — CR DG CHEST 1V PORT
1 series · 1 of 1 positions shown · non-contrast
Comparison: 09/07/2016.

CLINICAL DATA: Preop for small bowel obstruction.

EXAM:
PORTABLE CHEST 1 VIEW

[portable]
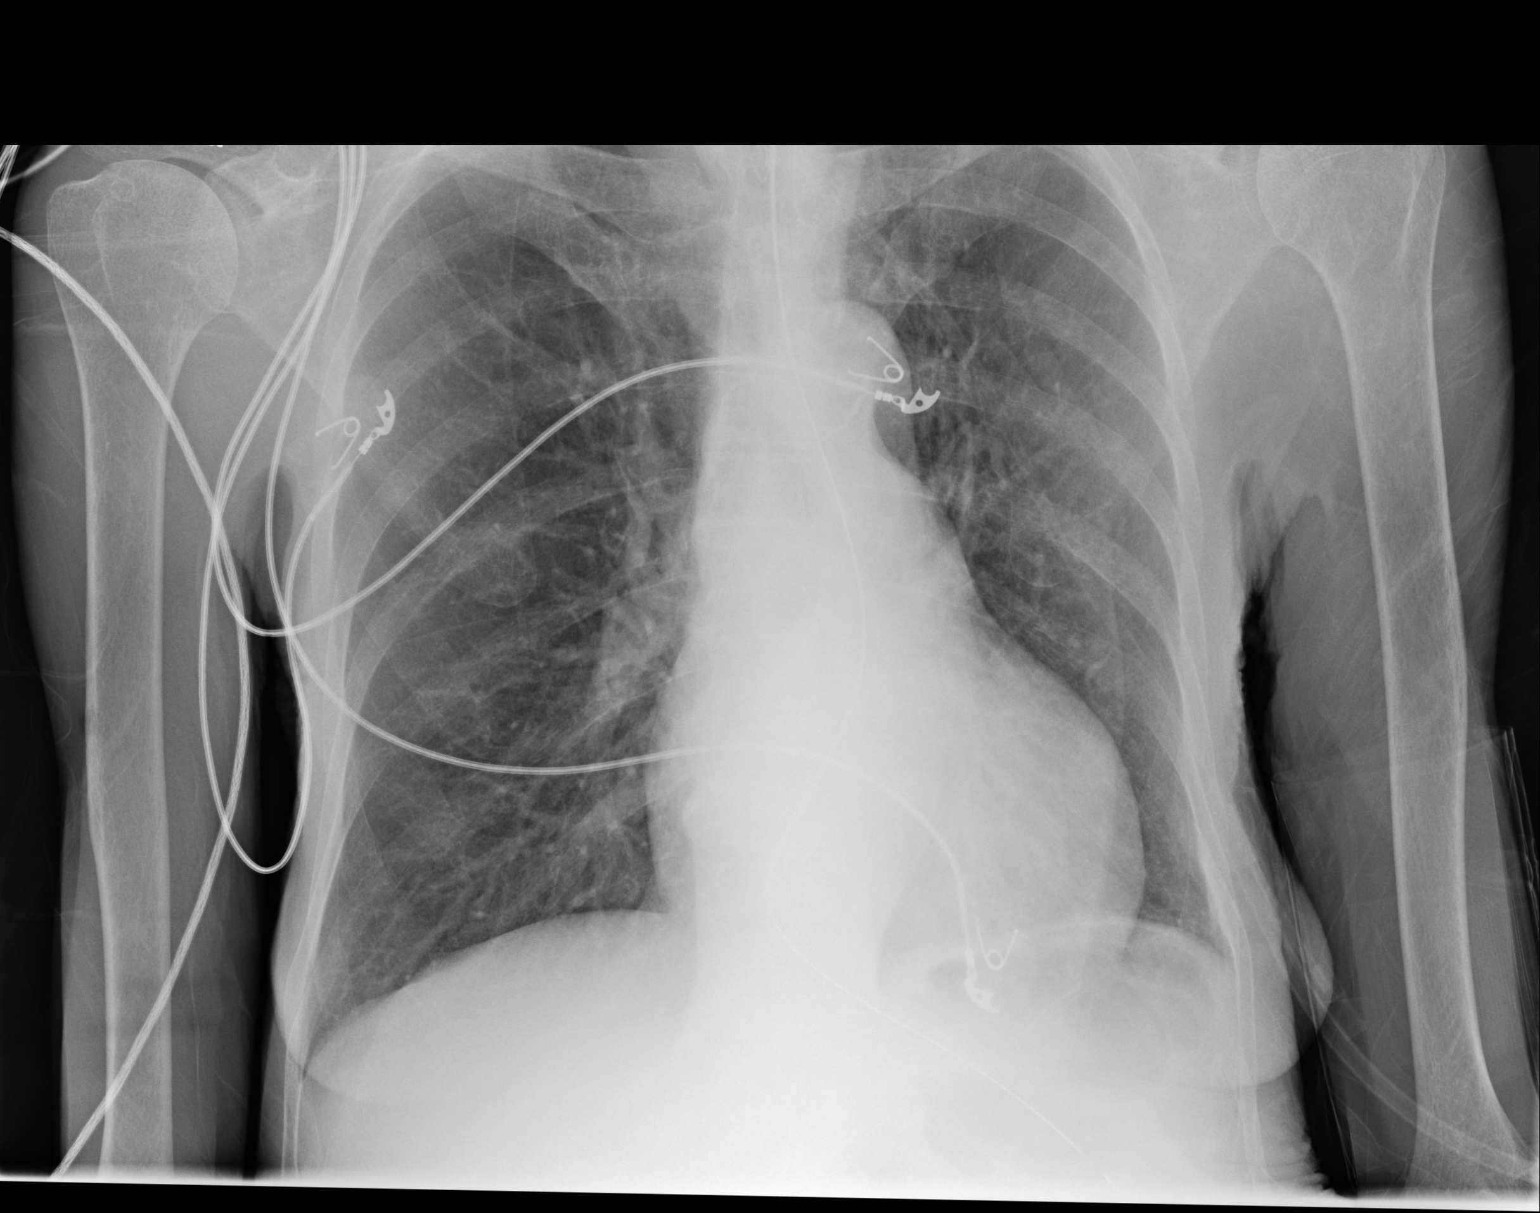

[1 of 1 positions shown; findings below may reference images not displayed]

FINDINGS: The heart size and mediastinal contours are within normal limits.
Both lungs are clear. The visualized skeletal structures are
unremarkable. Nasogastric tube is in the stomach.

Compared with priors, aeration is improved.
IMPRESSION: No active disease.

## 2018-10-03 IMAGING — CT CT ABD-PELV W/ CM
2 of 5 series · 15 of 46 positions shown, 17 images · IV contrast (Isovue)
Comparison: CT of the abdomen and pelvis performed 09/05/2016

CLINICAL DATA: Acute onset of sharp intermittent right groin and
pelvic pain. Nausea and vomiting.

EXAM:
CT ABDOMEN AND PELVIS WITH CONTRAST
TECHNIQUE: Multidetector CT imaging of the abdomen and pelvis was performed
using the standard protocol following bolus administration of
intravenous contrast.
CONTRAST:  100mL 97WHLL-PZZ IOPAMIDOL (97WHLL-PZZ) INJECTION 61%

[Series 2: axial st · axial · 0.60mm/px · z∈[+824,+1174]mm · 12 of 80 slices shown, 14 images]
[im 5/80  soft-tissue]
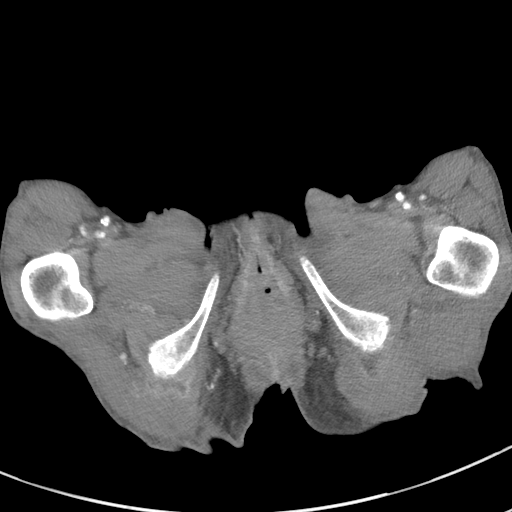
[im 5/80  bone]
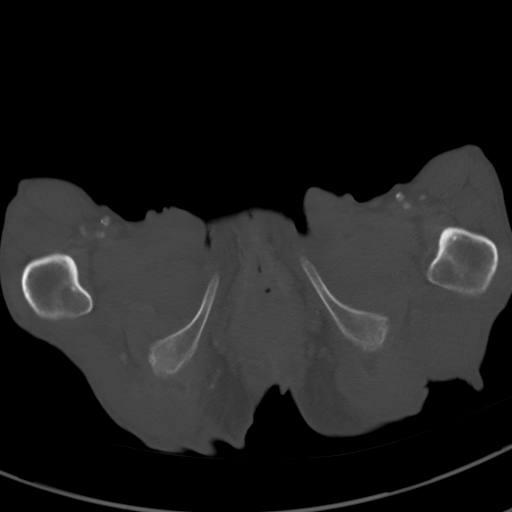
[im 13/80  soft-tissue]
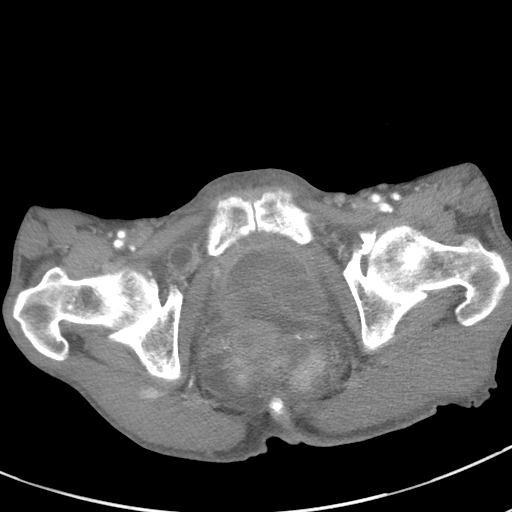
[im 17/80  soft-tissue]
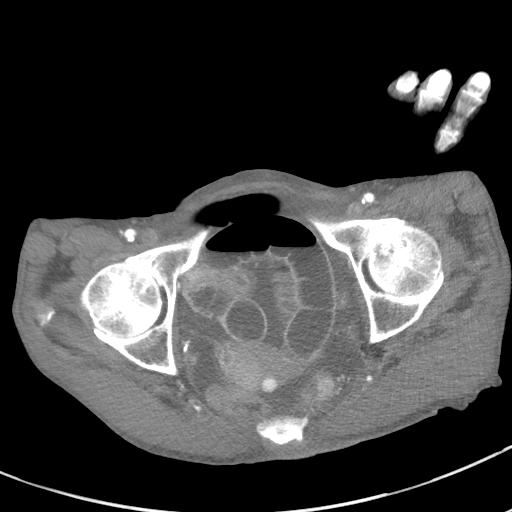
[im 25/80  soft-tissue]
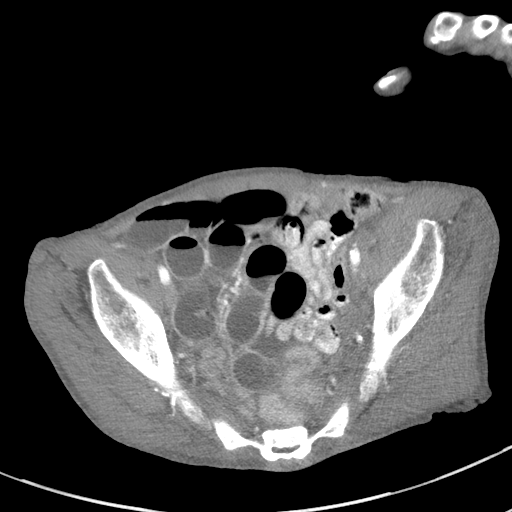
[im 30/80  soft-tissue]
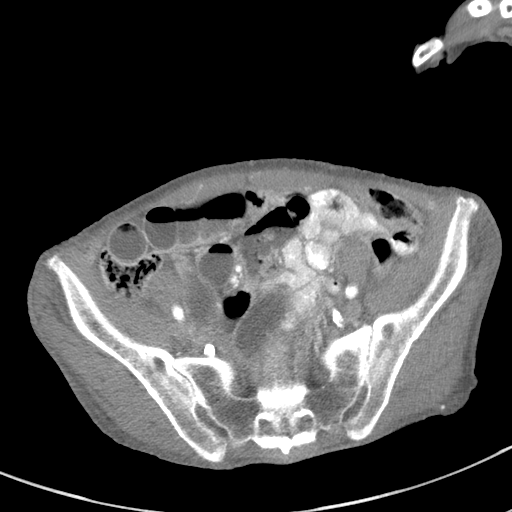
[im 38/80  soft-tissue]
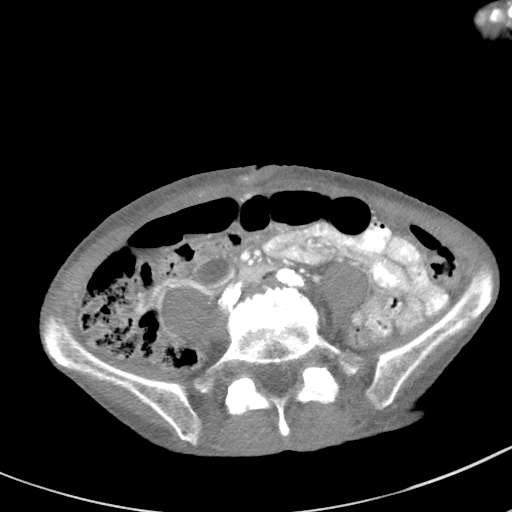
[im 42/80  soft-tissue]
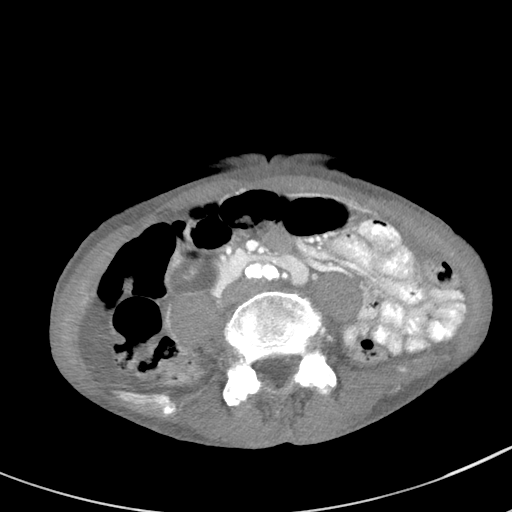
[im 50/80  soft-tissue]
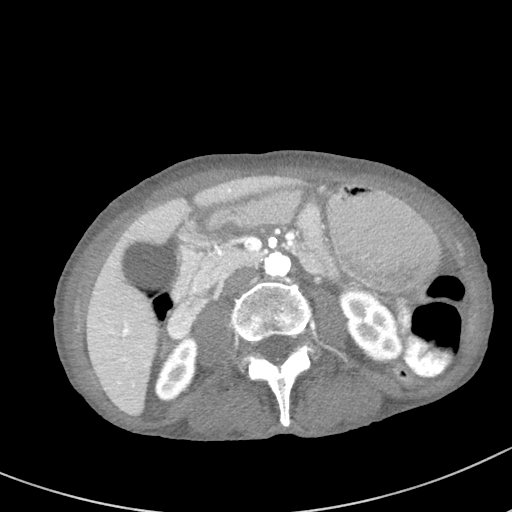
[im 55/80  soft-tissue]
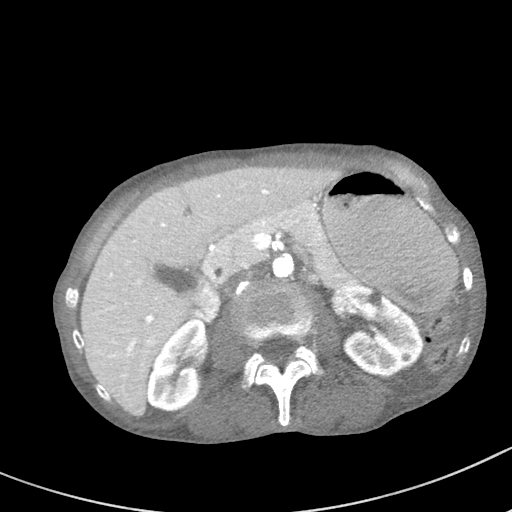
[im 55/80  bone]
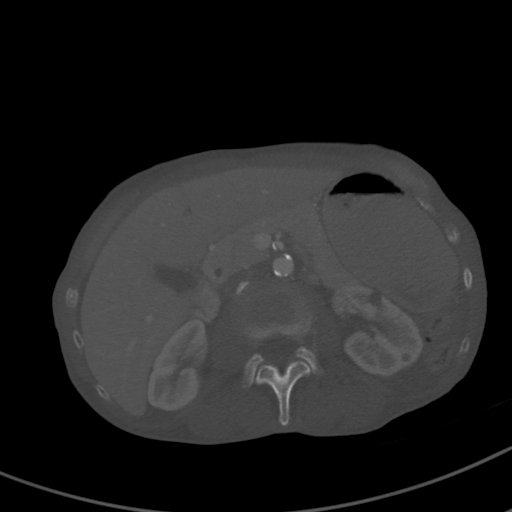
[im 63/80  soft-tissue]
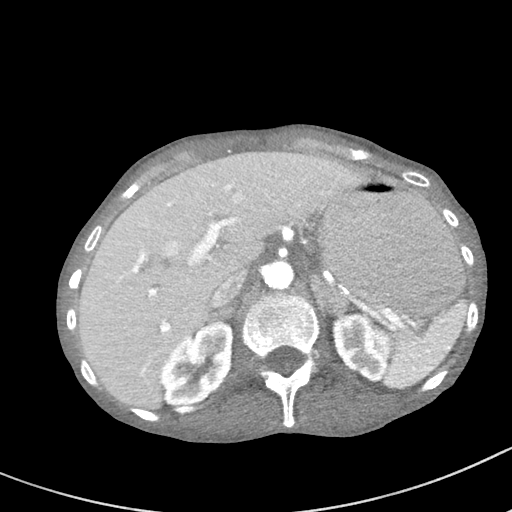
[im 67/80  soft-tissue]
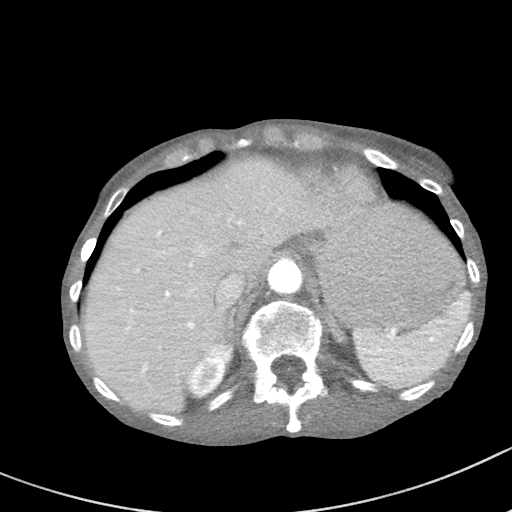
[im 75/80  soft-tissue]
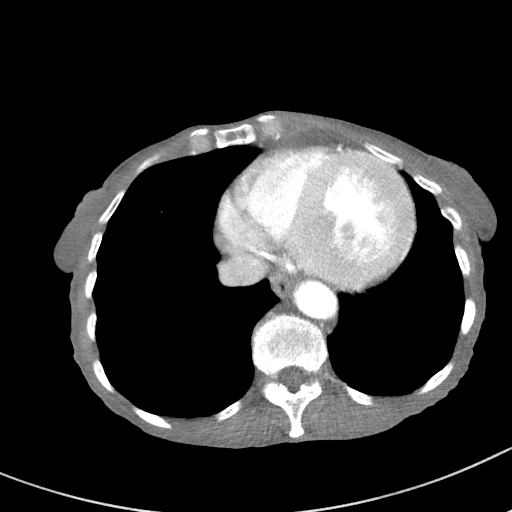

[Series 5: coronal st · coronal · 0.64mm/px · 3 of 81 slices shown]
[im 27/81  soft-tissue]
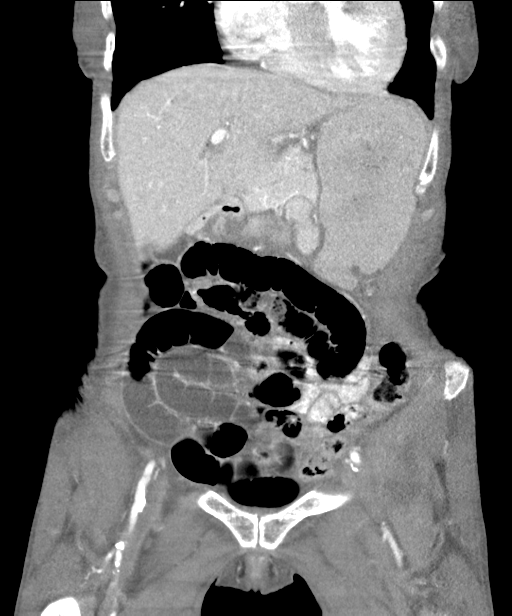
[im 36/81  soft-tissue]
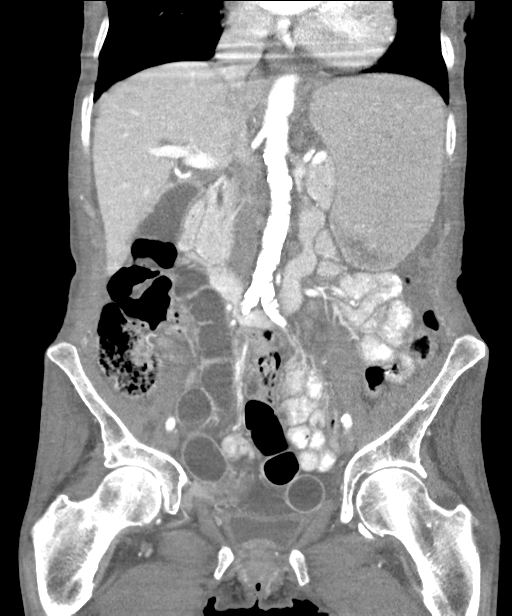
[im 45/81  soft-tissue]
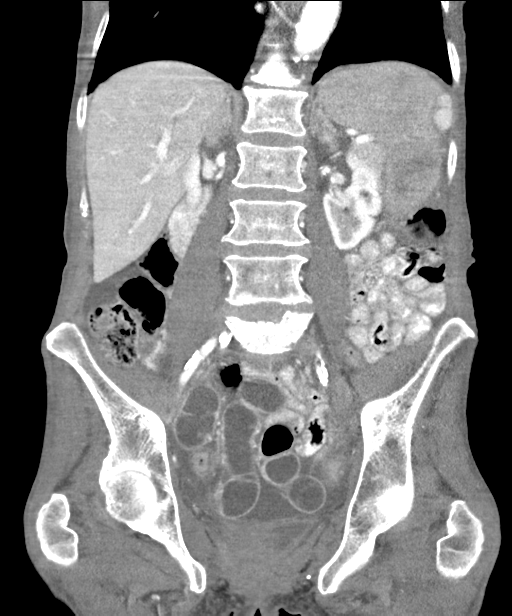

[15 of 46 positions shown; findings below may reference images not displayed]

FINDINGS: Lower chest: The visualized lung bases are grossly clear. Mild
cardiomegaly is noted. Diffuse coronary artery calcifications are
seen. Trace pericardial fluid likely remains within normal limits.

Hepatobiliary: The liver is unremarkable in appearance. The
gallbladder is unremarkable in appearance. The common bile duct
remains normal in caliber.

Pancreas: The pancreas is within normal limits.

Spleen: The spleen is unremarkable in appearance.

Adrenals/Urinary Tract: A 2.0 cm left adrenal nodule is again noted.

Small left renal cysts are noted. The kidneys are otherwise grossly
unremarkable. There is no evidence of hydronephrosis. No renal or
ureteral stones are identified. No significant perinephric stranding
is seen.

There is suggestion of vague adjacent retroperitoneal edema, which
may reflect mild anasarca.

Stomach/Bowel: There is distention of small bowel loops to 2.8 cm in
maximal diameter, with free fluid noted at the lower abdomen and
pelvis. There appears to be a small focal obturator hernia on the
right, involving mid to distal ileum, with distal decompression of
small bowel loops. This reflects small bowel obstruction secondary
to the obturator hernia.

There is residual air within portions of the colon. The colon is
grossly unremarkable in appearance, though difficult to fully assess
due to surrounding bowel loops.

Vascular/Lymphatic: Diffuse calcification is seen along the
abdominal aorta and its branches. The abdominal aorta is otherwise
grossly unremarkable. The inferior vena cava is grossly
unremarkable. No retroperitoneal lymphadenopathy is seen. No pelvic
sidewall lymphadenopathy is identified.

Reproductive: The bladder is mildly distended and grossly
unremarkable. Uterine fibroids are noted. No suspicious adnexal
masses are seen.

Other: No additional soft tissue abnormalities are seen.

Musculoskeletal: No acute osseous abnormalities are identified.
There is stable chronic compression deformity of vertebral body L5.
The visualized musculature is unremarkable in appearance.
IMPRESSION: 1. Small bowel obstruction noted secondary to a small focal
right-sided obturator hernia involving mid to distal ileum, with
distal decompression of small bowel loops. Associated distention of
more proximal small bowel loops, with free fluid at the lower
abdomen and pelvis.
2.  Aortic Atherosclerosis ([id number]).
3. Small left renal cysts.
4. 2.0 cm left adrenal nodule again noted.
5. Mild cardiomegaly.  Diffuse coronary artery calcifications.
6. Uterine fibroids noted.
These results were called by telephone at the time of interpretation
on 12/23/2016 at [DATE] to Dr. ALEXIS BURGOS CHIC, who verbally
acknowledged these results.
